# Patient Record
Sex: Female | Born: 1980 | Race: Black or African American | Hispanic: No | Marital: Single | Smoking: Current every day smoker
Health system: Southern US, Community
[De-identification: ages and names within clinical notes are randomized; demographics above are authoritative.]

## PROBLEM LIST (undated history)

## (undated) DIAGNOSIS — I82409 Acute embolism and thrombosis of unspecified deep veins of unspecified lower extremity: Secondary | ICD-10-CM

## (undated) HISTORY — PX: KIDNEY SURGERY: SHX687

## (undated) HISTORY — PX: APPENDECTOMY: SHX54

---

## 2021-03-24 ENCOUNTER — Other Ambulatory Visit: Payer: Self-pay

## 2021-03-24 ENCOUNTER — Emergency Department (HOSPITAL_COMMUNITY)
Admission: EM | Admit: 2021-03-24 | Discharge: 2021-03-24 | Disposition: A | Discharge status: Home / Self Care | Payer: Self-pay | Attending: Emergency Medicine | Admitting: Emergency Medicine

## 2021-03-24 ENCOUNTER — Encounter (HOSPITAL_COMMUNITY): Payer: Self-pay

## 2021-03-24 ENCOUNTER — Ambulatory Visit (HOSPITAL_COMMUNITY): Admission: EM | Admit: 2021-03-24 | Discharge: 2021-03-24 | Payer: Self-pay

## 2021-03-24 ENCOUNTER — Emergency Department (HOSPITAL_COMMUNITY): Payer: Self-pay

## 2021-03-24 ENCOUNTER — Emergency Department (HOSPITAL_BASED_OUTPATIENT_CLINIC_OR_DEPARTMENT_OTHER): Payer: Self-pay

## 2021-03-24 ENCOUNTER — Encounter (HOSPITAL_COMMUNITY): Payer: Self-pay | Admitting: Emergency Medicine

## 2021-03-24 DIAGNOSIS — M7989 Other specified soft tissue disorders: Secondary | ICD-10-CM

## 2021-03-24 DIAGNOSIS — I824Z1 Acute embolism and thrombosis of unspecified deep veins of right distal lower extremity: Secondary | ICD-10-CM

## 2021-03-24 DIAGNOSIS — M79604 Pain in right leg: Secondary | ICD-10-CM

## 2021-03-24 DIAGNOSIS — Z7901 Long term (current) use of anticoagulants: Secondary | ICD-10-CM | POA: Insufficient documentation

## 2021-03-24 DIAGNOSIS — F17210 Nicotine dependence, cigarettes, uncomplicated: Secondary | ICD-10-CM | POA: Insufficient documentation

## 2021-03-24 DIAGNOSIS — I82401 Acute embolism and thrombosis of unspecified deep veins of right lower extremity: Secondary | ICD-10-CM | POA: Insufficient documentation

## 2021-03-24 HISTORY — DX: Acute embolism and thrombosis of unspecified deep veins of unspecified lower extremity: I82.409

## 2021-03-24 LAB — COMPREHENSIVE METABOLIC PANEL
ALT: 16 U/L (ref 0–44)
AST: 15 U/L (ref 15–41)
Albumin: 3.5 g/dL (ref 3.5–5.0)
Alkaline Phosphatase: 97 U/L (ref 38–126)
Anion gap: 7 (ref 5–15)
BUN: 7 mg/dL (ref 6–20)
CO2: 22 mmol/L (ref 22–32)
Calcium: 8.7 mg/dL — ABNORMAL LOW (ref 8.9–10.3)
Chloride: 110 mmol/L (ref 98–111)
Creatinine, Ser: 1.1 mg/dL — ABNORMAL HIGH (ref 0.44–1.00)
GFR, Estimated: >60 mL/min (ref >60)
Glucose, Bld: 92 mg/dL (ref 70–99)
Potassium: 3.2 mmol/L — ABNORMAL LOW (ref 3.5–5.1)
Sodium: 139 mmol/L (ref 135–145)
Total Bilirubin: 0.8 mg/dL (ref 0.3–1.2)
Total Protein: 6.1 g/dL — ABNORMAL LOW (ref 6.5–8.1)

## 2021-03-24 LAB — CBC WITH DIFFERENTIAL/PLATELET
Abs Immature Granulocytes: 0.02 10*3/uL (ref 0.00–0.07)
Basophils Absolute: 0 10*3/uL (ref 0.0–0.1)
Basophils Relative: 0 %
Eosinophils Absolute: 0.1 10*3/uL (ref 0.0–0.5)
Eosinophils Relative: 2 %
HCT: 35.4 % — ABNORMAL LOW (ref 36.0–46.0)
Hemoglobin: 11.9 g/dL — ABNORMAL LOW (ref 12.0–15.0)
Immature Granulocytes: 0 %
Lymphocytes Relative: 45 %
Lymphs Abs: 2.5 10*3/uL (ref 0.7–4.0)
MCH: 34.7 pg — ABNORMAL HIGH (ref 26.0–34.0)
MCHC: 33.6 g/dL (ref 30.0–36.0)
MCV: 103.2 fL — ABNORMAL HIGH (ref 80.0–100.0)
Monocytes Absolute: 0.4 10*3/uL (ref 0.1–1.0)
Monocytes Relative: 7 %
Neutro Abs: 2.6 10*3/uL (ref 1.7–7.7)
Neutrophils Relative %: 46 %
Platelets: 198 10*3/uL (ref 150–400)
RBC: 3.43 MIL/uL — ABNORMAL LOW (ref 3.87–5.11)
RDW: 13.9 % (ref 11.5–15.5)
WBC: 5.5 10*3/uL (ref 4.0–10.5)
nRBC: 0 % (ref 0.0–0.2)

## 2021-03-24 LAB — I-STAT BETA HCG BLOOD, ED (MC, WL, AP ONLY): I-stat hCG, quantitative: <5 m[IU]/mL (ref <5)

## 2021-03-24 IMAGING — CR DG ANKLE COMPLETE 3+V*R*
3 series · 3 of 3 positions shown · non-contrast
Comparison: None.

CLINICAL DATA: Ankle pain following history of prior DVT, initial
encounter

EXAM:
RIGHT ANKLE - COMPLETE 3+ VIEW

[ankle ap]
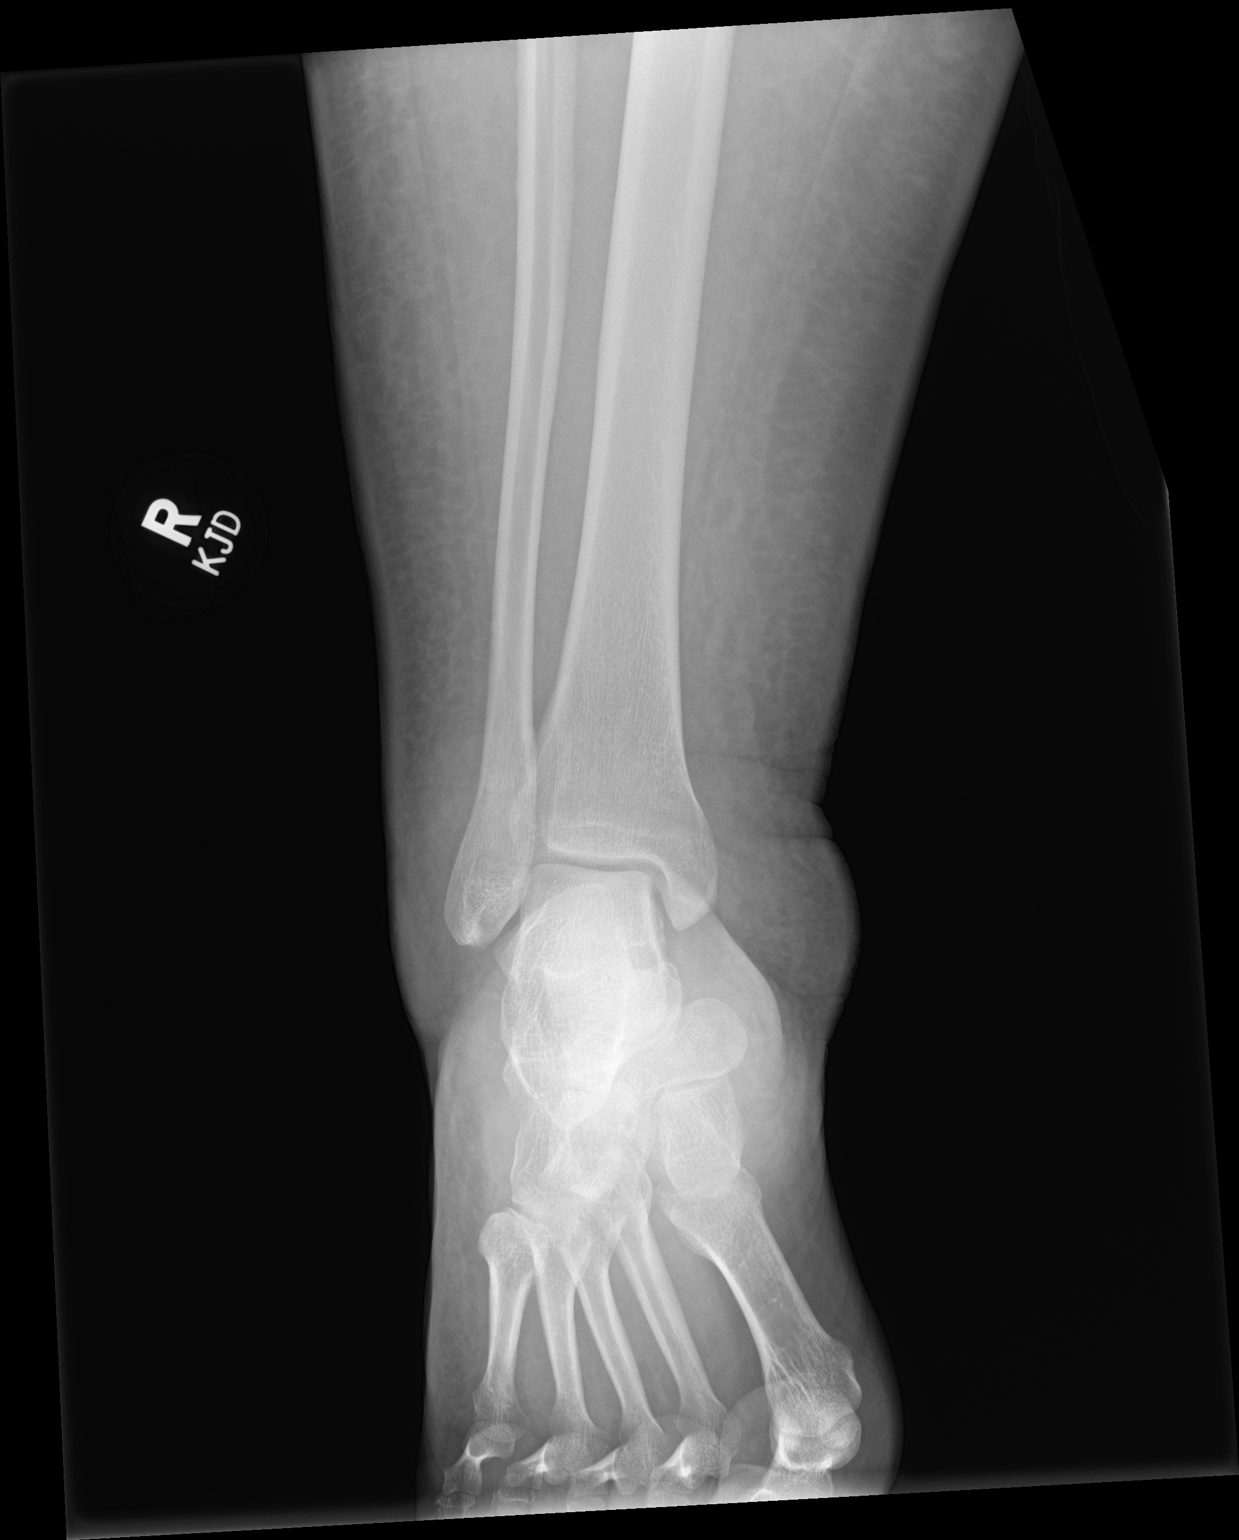

[ankle obl]
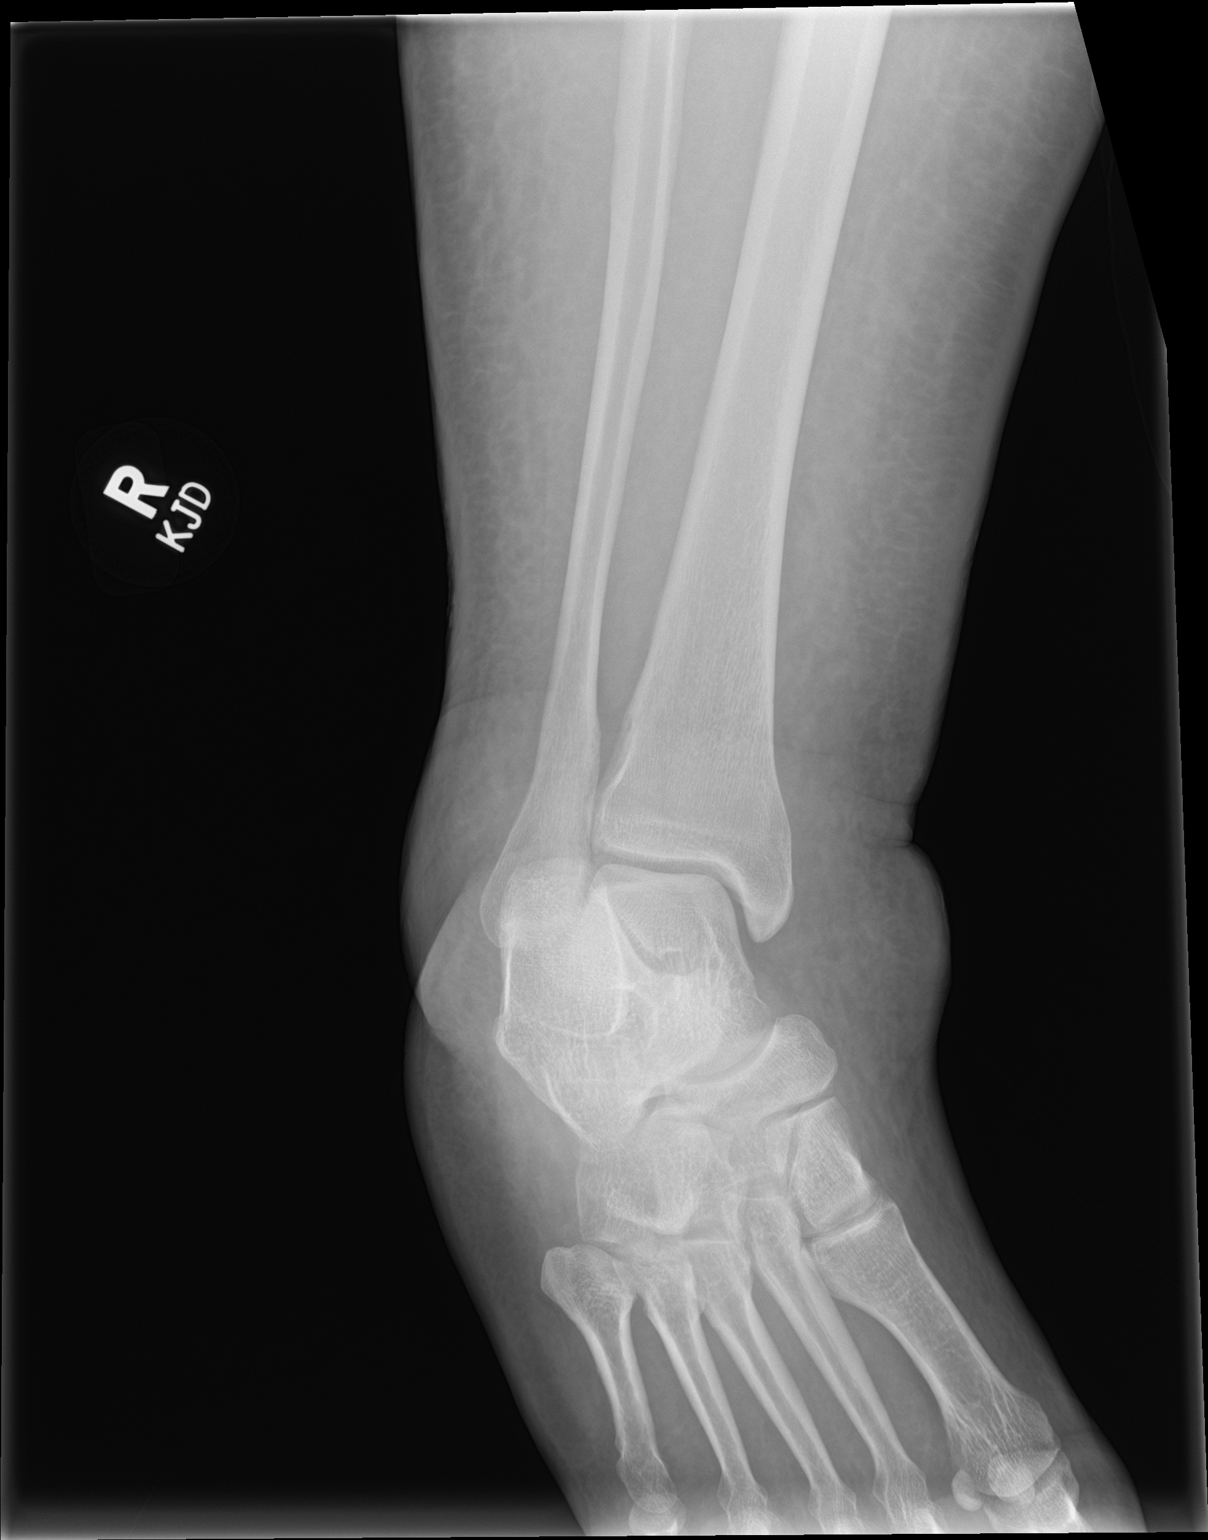

[ankle lat]
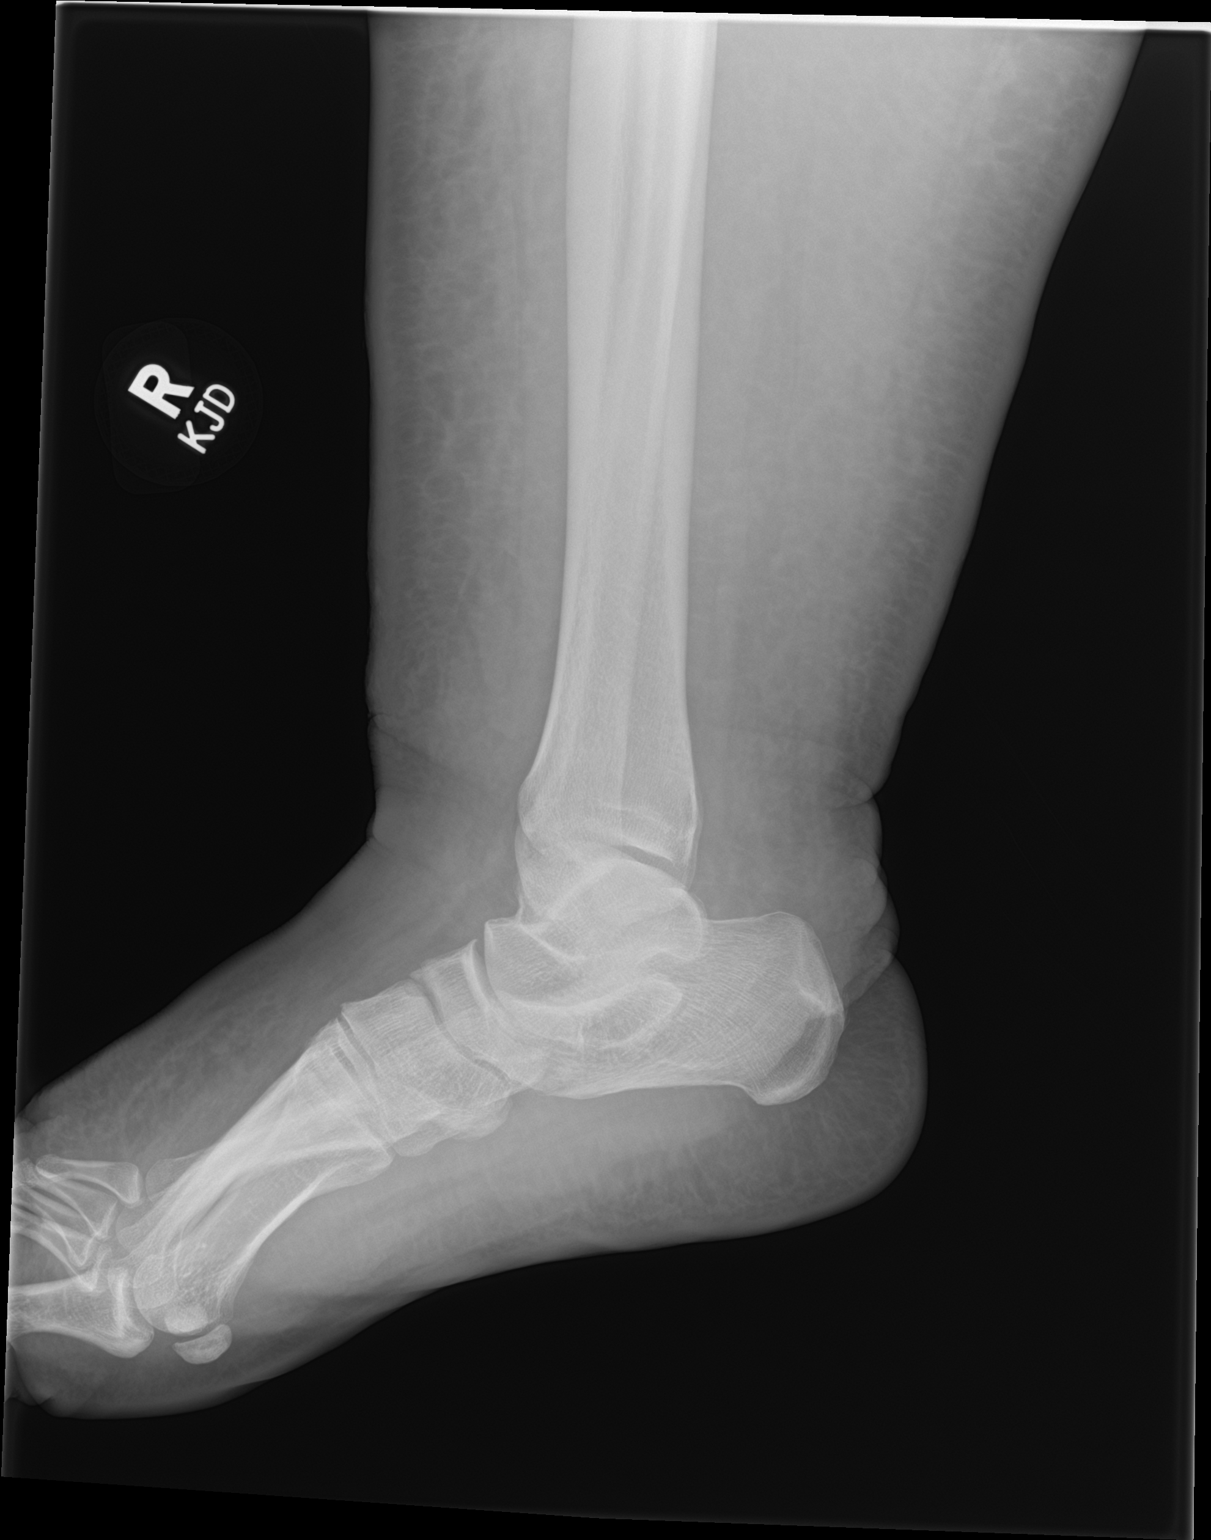

[3 of 3 positions shown; findings below may reference images not displayed]

FINDINGS: Mild generalized soft tissue swelling is noted. No acute fracture or
dislocation is seen. No radiopaque foreign body is noted.
IMPRESSION: No acute bony abnormality is noted. Generalized soft tissue swelling
is seen.

## 2021-03-24 MED ORDER — RIVAROXABAN 20 MG PO TABS: 20.0000 mg | ORAL_TABLET | Freq: Every day | ORAL | 0 refills | Status: DC

## 2021-03-24 MED ORDER — RIVAROXABAN 20 MG PO TABS
20.0000 mg | ORAL_TABLET | Freq: Every day | ORAL | Status: DC
  Administered 2021-03-24: 20 mg via ORAL
  Filled 2021-03-24: qty 1

## 2021-03-24 MED ORDER — POTASSIUM CHLORIDE CRYS ER 20 MEQ PO TBCR
20.0000 meq | EXTENDED_RELEASE_TABLET | Freq: Once | ORAL | Status: AC
  Administered 2021-03-24: 20 meq via ORAL
  Filled 2021-03-24: qty 1

## 2021-03-24 NOTE — ED Triage Notes (Signed)
Pt in with c/o right lower leg swelling x 2 months  Pt states swelling is worse and now her leg is weeping yellow drainage and it is very itchy  Pt was prescribed lovenox for hx of  Dvt but has not taken in over 1 month due to moving

## 2021-03-24 NOTE — ED Provider Notes (Signed)
[facility] EMERGENCY DEPARTMENT Provider Note   CSN: [id number] Arrival date & time: 03/24/21  1635     History No chief complaint on file.   Denise Santana is a 40 y.o. female with PMH/o DVT presents for evaluation of right lower extremity pain, swelling, drainage.  She reports about 2 months ago, she was diagnosed with a DVT and was put on Lovenox.  She reports that she recently moved here from [place] and states that she does not have the Lovenox to take.  She states she has not taken in about 4 to 6 weeks.  She states that she has continued to have swelling noted in the right lower extremity.  She also reports that she had some areas on the skin where it would breakdown and started draining.  She denies any new trauma, injury, fall.  She reports that sometimes it hurts but states that she thinks that this is from walking around on it all the time.  She has not had any fevers, nausea/vomiting, chest pain, difficulty breathing.  The history is provided by the patient.      Past Medical History:  Diagnosis Date   DVT (deep venous thrombosis) (HCC)     There are no problems to display for this patient.   History reviewed. No pertinent surgical history.   OB History   No obstetric history on file.     Family History  Problem Relation Age of Onset   Healthy Mother     Social History   Tobacco Use   Smoking status: Every Day    Pack years: 0.00    Types: Cigarettes   Smokeless tobacco: Never  Substance Use Topics   Alcohol use: Never   Drug use: Never    Home Medications Prior to Admission medications   Medication Sig Start Date End Date Taking? Authorizing Provider  enoxaparin (LOVENOX) 100 MG/ML injection enoxaparin 100 mg/mL subcutaneous syringe  INJECT 1 SYRINGE SUBCUTANEOUSLY EVERY 12 HOURS    [provider]  rivaroxaban (XARELTO) 20 MG TABS tablet Xarelto 20 mg tablet  Take 1 tablet every day by oral route for 30  days.    [provider]    Allergies    Patient has no known allergies.  Review of Systems   Review of Systems  Constitutional:  Negative for fever.  Respiratory:  Negative for shortness of breath.   Cardiovascular:  Positive for leg swelling. Negative for chest pain.  Gastrointestinal:  Negative for abdominal pain, nausea and vomiting.  Neurological:  Negative for weakness, numbness and headaches.  All other systems reviewed and are negative.  Physical Exam Updated Vital Signs BP (!) 100/50 (BP Location: Left Arm)   Pulse 65   Temp 97.9 F (36.6 C) (Oral)   Resp 16   LMP 03/16/2021 (Exact Date)   SpO2 98%   Physical Exam Vitals and nursing note reviewed.  Constitutional:      Appearance: Normal appearance. She is well-developed.  HENT:     Head: Normocephalic and atraumatic.  Eyes:     General: Lids are normal.     Conjunctiva/sclera: Conjunctivae normal.     Pupils: Pupils are equal, round, and reactive to light.  Cardiovascular:     Rate and Rhythm: Normal rate and regular rhythm.     Pulses:          Radial pulses are 2+ on the right side and 2+ on the left side.  Popliteal pulses are 2+ on the left side.       Dorsalis pedis pulses are detected w/ Doppler on the right side and 2+ on the left side.     Heart sounds: Normal heart sounds. No murmur heard.   No friction rub. No gallop.     Comments: Difficulty obtaining a DP pulse in the right lower extremity secondary to swelling.  Easily obtainable Doppler pulse. Pulmonary:     Effort: Pulmonary effort is normal.     Breath sounds: Normal breath sounds.     Comments: Lungs clear to auscultation bilaterally.  Symmetric chest rise.  No wheezing, rales, rhonchi. Abdominal:     Palpations: Abdomen is soft. Abdomen is not rigid.     Tenderness: There is no abdominal tenderness. There is no guarding.  Musculoskeletal:        General: Normal range of motion.     Cervical back: Full passive range of  motion without pain.     Comments: Right lower extremity noted with distal swelling, overlying erythema, scattered, dry scaly skin's.  No open wound.  No active drainage.  No wound in between all 5 digits of right lower extremity.  Skin:    General: Skin is warm and dry.     Capillary Refill: Capillary refill takes less than 2 seconds.     Comments: Good distal cap refill. RLE is not dusky in appearance or cool to touch.  Neurological:     Mental Status: She is alert and oriented to person, place, and time.  Psychiatric:        Speech: Speech normal.          ED Results / Procedures / Treatments   Labs (all labs ordered are listed, but only abnormal results are displayed) Labs Reviewed  CBC WITH DIFFERENTIAL/PLATELET - Abnormal; Notable for the following components:      Result Value   RBC 3.43 (*)    Hemoglobin 11.9 (*)    HCT 35.4 (*)    MCV 103.2 (*)    MCH 34.7 (*)    All other components within normal limits  COMPREHENSIVE METABOLIC PANEL - Abnormal; Notable for the following components:   Potassium 3.2 (*)    Creatinine, Ser 1.10 (*)    Calcium 8.7 (*)    Total Protein 6.1 (*)    All other components within normal limits  I-STAT BETA HCG BLOOD, ED ([facility], [facility], [facility] ONLY)    EKG None  Radiology DG Ankle Complete Right  Result Date: 03/24/2021 CLINICAL DATA:  Ankle pain following history of prior DVT, initial encounter EXAM: RIGHT ANKLE - COMPLETE 3+ VIEW COMPARISON:  None. FINDINGS: Mild generalized soft tissue swelling is noted. No acute fracture or dislocation is seen. No radiopaque foreign body is noted. IMPRESSION: No acute bony abnormality is noted. Generalized soft tissue swelling is seen. Electronically Signed   By: Alcide Clever M.D.   On: 03/24/2021 20:47   DG Foot Complete Right  Result Date: 03/24/2021 CLINICAL DATA:  Foot pain and swelling, no known injury, initial encounter EXAM: RIGHT FOOT COMPLETE - 3+ VIEW COMPARISON:  None. FINDINGS: No acute fracture or  dislocation is noted. Mild tarsal degenerative changes are seen. Generalized soft tissue swelling is noted. IMPRESSION: Soft tissue swelling without acute bony abnormality. Electronically Signed   By: Alcide Clever M.D.   On: 03/24/2021 20:49   VAS [place] LOWER EXTREMITY VENOUS (DVT) (ONLY [facility] & [facility] 7a-7p)  Result Date: 03/24/2021  Lower Venous DVT  Study Patient Name:  Denise Santana  Date of Exam:   03/24/2021 Medical Rec #: [id number]     Accession #:    [id number] Date of Birth: Sep 13, 1981     Patient Gender: F Patient Age:   12Y Exam Location:  [facility] Procedure:      VAS [place] LOWER EXTREMITY VENOUS (DVT) Referring Phys: [id number] Merryn Thaker A Malala Trenkamp --------------------------------------------------------------------------------  Indications: Pain, Swelling, and weeping.  Risk Factors: DVT History of DVT. Patient is prescribed Lovenox but has not been compliant. Comparison Study: No prior study on file Performing Technologist: Sherren Kerns RVS  Examination Guidelines: A complete evaluation includes B-mode imaging, spectral Doppler, color Doppler, and power Doppler as needed of all accessible portions of each vessel. Bilateral testing is considered an integral part of a complete examination. Limited examinations for reoccurring indications may be performed as noted. The reflux portion of the exam is performed with the patient in reverse Trendelenburg.  +---------+---------------+---------+-----------+----------+--------------+ RIGHT    CompressibilityPhasicitySpontaneityPropertiesThrombus Aging +---------+---------------+---------+-----------+----------+--------------+ CFV      Full           Yes      Yes                                 +---------+---------------+---------+-----------+----------+--------------+ SFJ      Full                                                        +---------+---------------+---------+-----------+----------+--------------+ FV Prox  Full                                                         +---------+---------------+---------+-----------+----------+--------------+ FV Mid   Full                                                        +---------+---------------+---------+-----------+----------+--------------+ FV DistalFull                                                        +---------+---------------+---------+-----------+----------+--------------+ PFV      Full                                                        +---------+---------------+---------+-----------+----------+--------------+ POP      Full           Yes      Yes                                 +---------+---------------+---------+-----------+----------+--------------+ PTV      None  Acute          +---------+---------------+---------+-----------+----------+--------------+ PERO     None                                         Acute          +---------+---------------+---------+-----------+----------+--------------+   +----+---------------+---------+-----------+----------+--------------+ LEFTCompressibilityPhasicitySpontaneityPropertiesThrombus Aging +----+---------------+---------+-----------+----------+--------------+ CFV Full           Yes      Yes                                 +----+---------------+---------+-----------+----------+--------------+     Summary: RIGHT: - Findings consistent with acute deep vein thrombosis involving the right peroneal veins, and right posterior tibial veins. - No cystic structure found in the popliteal fossa.  LEFT: - No evidence of common femoral vein obstruction.  *See table(s) above for measurements and observations.    Preliminary     Procedures Procedures   Medications Ordered in ED Medications - No data to display  ED Course  I have reviewed the triage vital signs and the nursing notes.  Pertinent labs & imaging results that were available during my care of  the patient were reviewed by me and considered in my medical decision making (see chart for details).    MDM Rules/Calculators/A&P                          40 year old female who presents for evaluation of right lower extremity swelling.  She also reports it has been draining.  She has a history of known DVTs and was diagnosed about 2 months ago.  She supposed be on Lovenox but states she has not taken it in several weeks.  She denies any fevers, chest pain, difficulty breathing.  On initial arrival, she is afebrile, toxic appearing.  Vital signs are stable.  On exam, right lower extremity is diffusely edematous, erythematous.  She has some areas of dry scaly skin but no open wounds.  No active drainage.  No wounds between her toes.  Will check labs, x-ray, ultrasound.  History/physical exam not concerning for ischemic limb, septic arthritis.  Consider overlying cellulitis as well as most likely continue DVT given lack of anticoagulation therapy.  Vascular US shows acute right DVT of peroneal and tibia veins.   I-STAT beta negative.  CMP shows normal BUN and creatinine.  CBC shows no leukocytosis.  Hemoglobin stable 11.9.  Ankle x-ray negative for any acute bony abnormality.  There is generalized soft tissue swelling noted.  Foot x-ray negative for any acute bony abnormality.  Given the DVT noted on her ultrasound today, we will plan to treat.  At this time, patient is not having any chest pain, difficulty breathing.  She is not tachycardic or hypoxic on exam.   I discussed results with patient.  At this time, I suspect her swelling is likely related DVT as well as some perhaps chronic venous stasis dermatitis.  She has no fever, no leukocytosis.  Doubt that this is reflective of cellulitis.  We will plan to treat.  I discussed with pharmacy.  They recommend starting her on Xarelto 20 mg daily.  I did discuss this with patient she is agreeable to trying the pills.  Our transition of care team has  arranged for her to get the  medications at the pharmacy tomorrow.  We will give her first dose today. At this time, patient exhibits no emergent life-threatening condition that require further evaluation in ED. Patient had ample opportunity for questions and discussion. All patient's questions were answered with full understanding. Strict return precautions discussed. Patient expresses understanding and agreement to plan.   Portions of this note were generated with Scientist, clinical (histocompatibility and immunogenetics)Dragon dictation software. Dictation errors may occur despite best attempts at proofreading.   Final Clinical Impression(s) / ED Diagnoses Final diagnoses:  Acute deep vein thrombosis (DVT) of distal vein of right lower extremity [place](HCC)    Rx / DC Orders ED Discharge Orders     None        Rosana HoesLayden, Levi Crass A, PA-C 03/24/21 2223    Benjiman CorePickering, Nathan, MD 03/24/21 [phone]

## 2021-03-24 NOTE — ED Triage Notes (Signed)
Pt reports R lower leg swelling since having DVT.  Reports weeping yellow drainage and itching x 2 months.    Sent from [facility].

## 2021-03-24 NOTE — Discharge Instructions (Addendum)
It is important that you take the Xarelto.  Please follow-up with referred primary care at [facility].  Call their office on Monday to arrange an appointment.  Return to emergency department for any difficulty breathing, chest pain or any other worsening concerning symptoms.

## 2021-03-24 NOTE — ED Notes (Signed)
Patient is being discharged from the Urgent Care and sent to the Emergency Department via personal vehicle . Per provider Chales Salmon, patient is in need of higher level of care due to DVT rule out. Patient is aware and verbalizes understanding of plan of care.   Vitals:   03/24/21 1615  BP: 114/71  Pulse: 82  Resp: 20  Temp: 98.3 F (36.8 C)  SpO2: 97%

## 2021-03-24 NOTE — Progress Notes (Signed)
VASCULAR LAB    Right lower extremity venous duplex has been performed.  See CV proc for preliminary results.   Messaged results to Graciella Freer, PA-C via secure chat  Sherren Kerns, RVT 03/24/2021, 6:42 PM

## 2021-03-25 NOTE — Care Management (Signed)
Patient consult for medication assistance. Pateint is not eligible for MATCH due to having insurance, pharmacy should have xaralto cards to use to get discounts.

## 2021-04-23 ENCOUNTER — Emergency Department (HOSPITAL_COMMUNITY): Payer: No Typology Code available for payment source

## 2021-04-23 ENCOUNTER — Emergency Department (HOSPITAL_BASED_OUTPATIENT_CLINIC_OR_DEPARTMENT_OTHER): Payer: No Typology Code available for payment source

## 2021-04-23 ENCOUNTER — Encounter (HOSPITAL_COMMUNITY): Payer: Self-pay

## 2021-04-23 ENCOUNTER — Other Ambulatory Visit: Payer: Self-pay

## 2021-04-23 ENCOUNTER — Emergency Department (HOSPITAL_COMMUNITY)
Admission: EM | Admit: 2021-04-23 | Discharge: 2021-04-23 | Disposition: A | Discharge status: Home / Self Care | Payer: No Typology Code available for payment source | Attending: Student | Admitting: Student

## 2021-04-23 DIAGNOSIS — R0602 Shortness of breath: Secondary | ICD-10-CM | POA: Insufficient documentation

## 2021-04-23 DIAGNOSIS — Z7901 Long term (current) use of anticoagulants: Secondary | ICD-10-CM | POA: Insufficient documentation

## 2021-04-23 DIAGNOSIS — Z5321 Procedure and treatment not carried out due to patient leaving prior to being seen by health care provider: Secondary | ICD-10-CM | POA: Diagnosis not present

## 2021-04-23 DIAGNOSIS — Z86711 Personal history of pulmonary embolism: Secondary | ICD-10-CM | POA: Diagnosis not present

## 2021-04-23 DIAGNOSIS — M7989 Other specified soft tissue disorders: Secondary | ICD-10-CM

## 2021-04-23 DIAGNOSIS — M79604 Pain in right leg: Secondary | ICD-10-CM

## 2021-04-23 DIAGNOSIS — R2241 Localized swelling, mass and lump, right lower limb: Secondary | ICD-10-CM | POA: Diagnosis not present

## 2021-04-23 LAB — CBC WITH DIFFERENTIAL/PLATELET
Abs Immature Granulocytes: 0.03 10*3/uL (ref 0.00–0.07)
Basophils Absolute: 0 10*3/uL (ref 0.0–0.1)
Basophils Relative: 0 %
Eosinophils Absolute: 0.1 10*3/uL (ref 0.0–0.5)
Eosinophils Relative: 2 %
HCT: 35.3 % — ABNORMAL LOW (ref 36.0–46.0)
Hemoglobin: 11.5 g/dL — ABNORMAL LOW (ref 12.0–15.0)
Immature Granulocytes: 0 %
Lymphocytes Relative: 51 %
Lymphs Abs: 3.6 10*3/uL (ref 0.7–4.0)
MCH: 33.4 pg (ref 26.0–34.0)
MCHC: 32.6 g/dL (ref 30.0–36.0)
MCV: 102.6 fL — ABNORMAL HIGH (ref 80.0–100.0)
Monocytes Absolute: 0.4 10*3/uL (ref 0.1–1.0)
Monocytes Relative: 6 %
Neutro Abs: 2.8 10*3/uL (ref 1.7–7.7)
Neutrophils Relative %: 41 %
Platelets: 265 10*3/uL (ref 150–400)
RBC: 3.44 MIL/uL — ABNORMAL LOW (ref 3.87–5.11)
RDW: 13.8 % (ref 11.5–15.5)
WBC: 7 10*3/uL (ref 4.0–10.5)
nRBC: 0 % (ref 0.0–0.2)

## 2021-04-23 LAB — BASIC METABOLIC PANEL
Anion gap: 7 (ref 5–15)
BUN: 11 mg/dL (ref 6–20)
CO2: 23 mmol/L (ref 22–32)
Calcium: 9.2 mg/dL (ref 8.9–10.3)
Chloride: 108 mmol/L (ref 98–111)
Creatinine, Ser: 1.18 mg/dL — ABNORMAL HIGH (ref 0.44–1.00)
GFR, Estimated: >60 mL/min (ref >60)
Glucose, Bld: 95 mg/dL (ref 70–99)
Potassium: 3.6 mmol/L (ref 3.5–5.1)
Sodium: 138 mmol/L (ref 135–145)

## 2021-04-23 LAB — PROTIME-INR
INR: 1 (ref 0.8–1.2)
Prothrombin Time: 13.4 seconds (ref 11.4–15.2)

## 2021-04-23 LAB — I-STAT BETA HCG BLOOD, ED (MC, WL, AP ONLY): I-stat hCG, quantitative: <5 m[IU]/mL (ref <5)

## 2021-04-23 LAB — TROPONIN I (HIGH SENSITIVITY): Troponin I (High Sensitivity): 4 ng/L (ref <18)

## 2021-04-23 NOTE — Progress Notes (Signed)
Lower extremity venous has been completed.   Preliminary results in CV Proc.   Blanch Media 04/23/2021 5:18 PM

## 2021-04-23 NOTE — ED Triage Notes (Signed)
Patient complains of 3 months of right lower leg swelling and not taking blood thinner as prescribed. Has hx of DVT. Patient alert and orinted, ANAD

## 2021-04-23 NOTE — ED Notes (Signed)
Pt did not want to stay and left AMA. °

## 2021-04-23 NOTE — ED Provider Notes (Signed)
Emergency Medicine Provider Triage Evaluation Note  Denise Santana , a 40 y.o. female  was evaluated in triage.  Pt complains of progressively worsening right lower extremity swelling x3 months, history of DVT not taking anticoagulation as prescribed.  Supposed to be on Xarelto.  Also endorses worsening shortness of breath with history of PEs.  States that she was initially on injectable, likely Lovenox, Arixtra never started oral anticoagulation due to cost..  Review of Systems  Positive: Shortness of breath, lower extremity swelling,R>L Negative: Chest pain nausea, vomiting, syncope  Physical Exam  BP 132/82   Pulse 66   Temp 98.5 F (36.9 C) (Oral)   Resp 18   SpO2 98%  Gen:   Awake, no distress   Resp:  Normal effort  MSK:   Moves extremities without difficulty  Other:  Significant right lower extremity edema, skin changes, weeping and exquisite tenderness palpation of the posterior calf, borderline tachycardic with regular rhythm, lung CTA B.  Medical Decision Making  Medically screening exam initiated at 4:50 PM.  Appropriate orders placed.  Denise Santana was informed that the remainder of the evaluation will be completed by another provider, this initial triage assessment does not replace that evaluation, and the importance of remaining in the ED until their evaluation is complete.  This chart was dictated using voice recognition software, Dragon. Despite the best efforts of this provider to proofread and correct errors, errors may still occur which can change documentation meaning.    Denise Santana 04/23/21 1655    Glendora Score, MD 04/23/21 [phone]

## 2021-06-13 ENCOUNTER — Encounter: Payer: Self-pay | Attending: Internal Medicine | Admitting: Internal Medicine

## 2021-06-13 ENCOUNTER — Other Ambulatory Visit: Payer: Self-pay

## 2021-06-13 DIAGNOSIS — Z86718 Personal history of other venous thrombosis and embolism: Secondary | ICD-10-CM | POA: Insufficient documentation

## 2021-06-13 DIAGNOSIS — Z87891 Personal history of nicotine dependence: Secondary | ICD-10-CM | POA: Insufficient documentation

## 2021-06-13 DIAGNOSIS — H9191 Unspecified hearing loss, right ear: Secondary | ICD-10-CM | POA: Insufficient documentation

## 2021-06-13 DIAGNOSIS — M7989 Other specified soft tissue disorders: Secondary | ICD-10-CM

## 2021-06-13 DIAGNOSIS — Z7901 Long term (current) use of anticoagulants: Secondary | ICD-10-CM | POA: Insufficient documentation

## 2021-06-13 DIAGNOSIS — I890 Lymphedema, not elsewhere classified: Secondary | ICD-10-CM | POA: Insufficient documentation

## 2021-06-13 DIAGNOSIS — S81801A Unspecified open wound, right lower leg, initial encounter: Secondary | ICD-10-CM | POA: Insufficient documentation

## 2021-06-13 DIAGNOSIS — X58XXXA Exposure to other specified factors, initial encounter: Secondary | ICD-10-CM | POA: Insufficient documentation

## 2021-06-13 NOTE — Progress Notes (Signed)
JERONICA, STLOUIS ([id number]) Visit Report for 06/13/2021 Allergy List Details Patient Name: Denise Santana, Denise Santana Date of Service: 06/13/2021 10:15 AM Medical Record Number: [id number] Patient Account Number: [id number] Date of Birth/Sex: 09-Mar-1981 (40 y.o. F) Treating RN: Denise Santana Primary Care Denise Santana: Denise Santana Other Clinician: Referring Denise Santana: Denise Santana Treating Denise Santana/Extender: Denise Santana in Treatment: 0 Allergies Active Allergies No Known Allergies Allergy Notes Electronic Signature(s) Signed: 06/13/2021 3:51:14 PM By: Denise Santana Entered By: Denise Santana on 06/13/2021 10:28:12 Peeks, Denise Santana ([id number]) -------------------------------------------------------------------------------- Arrival Information Details Patient Name: Denise Santana Date of Service: 06/13/2021 10:15 AM Medical Record Number: [id number] Patient Account Number: [id number] Date of Birth/Sex: Sep 17, 1980 (40 y.o. F) Treating RN: Denise Santana Primary Care Denise Santana: Denise Santana Other Clinician: Referring Denise Santana: Denise Santana Treating Denise Santana/Extender: Denise Santana in Treatment: 0 Visit Information Patient Arrived: Ambulatory Arrival Time: 10:21 Accompanied By: self Transfer Assistance: None Patient Identification Verified: Yes Secondary Verification Process Completed: Yes Patient Has Alerts: Yes Patient Alerts: Patient on Blood Thinner Eliquis Electronic Signature(s) Signed: 06/13/2021 3:51:14 PM By: Denise Santana Entered By: Denise Santana on 06/13/2021 10:25:14 Barresi, Denise Santana ([id number]) -------------------------------------------------------------------------------- Clinic Level of Care Assessment Details Patient Name: Denise Santana Date of Service: 06/13/2021 10:15 AM Medical Record Number: [id number] Patient Account Number: [id number] Date of Birth/Sex: 1981/07/30 (40 y.o. F) Treating RN: Denise Santana Primary Care Calysta Craigo: Denise Santana Other  Clinician: Referring Denise Santana: Denise Santana Treating Denise Santana/Extender: Denise Santana in Treatment: 0 Clinic Level of Care Assessment Items TOOL 2 Quantity Score []  - Use when only an EandM is performed on the INITIAL visit 0 ASSESSMENTS - Nursing Assessment / Reassessment []  - General Physical Exam (combine w/ comprehensive assessment (listed just below) when performed on new 0 pt. evals) []  - 0 Comprehensive Assessment (HX, ROS, Risk Assessments, Wounds Hx, etc.) ASSESSMENTS - Wound and Skin Assessment / Reassessment X - Simple Wound Assessment / Reassessment - one wound 1 5 []  - 0 Complex Wound Assessment / Reassessment - multiple wounds []  - 0 Dermatologic / Skin Assessment (not related to wound area) ASSESSMENTS - Ostomy and/or Continence Assessment and Care []  - Incontinence Assessment and Management 0 []  - 0 Ostomy Care Assessment and Management (repouching, etc.) PROCESS - Coordination of Care X - Simple Patient / Family Education for ongoing care 1 15 []  - 0 Complex (extensive) Patient / Family Education for ongoing care X- 1 10 Staff obtains , Records, Test Results / Process Orders []  - 0 Staff telephones HHA, Nursing Homes / Clarify orders / etc []  - 0 Routine Transfer to another Facility (non-emergent condition) []  - 0 Routine Hospital Admission (non-emergent condition) X- 1 15 New Admissions / / Ordering NPWT, Apligraf, etc. []  - 0 Emergency Hospital Admission (emergent condition) X- 1 10 Simple Discharge Coordination []  - 0 Complex (extensive) Discharge Coordination PROCESS - Special Needs []  - Pediatric / Minor Patient Management 0 []  - 0 Isolation Patient Management []  - 0 Hearing / Language / Visual special needs []  - 0 Assessment of Community assistance (transportation, D/C planning, etc.) []  - 0 Additional assistance / Altered mentation []  - 0 Support Surface(s) Assessment (bed, cushion, seat,  etc.) INTERVENTIONS - Wound Cleansing / Measurement X - Wound Imaging (photographs - any number of wounds) 1 5 []  - 0 Wound Tracing (instead of photographs) X- 1 5 Simple Wound Measurement - one wound []  - 0 Complex Wound Measurement - multiple wounds Denise Santana, Denise Santana ( ) X- 1 5 Simple Wound Cleansing - one wound []  - 0 Complex Wound  Cleansing - multiple wounds INTERVENTIONS - Wound Dressings X - Small Wound Dressing one or multiple wounds 1 10 []  - 0 Medium Wound Dressing one or multiple wounds []  - 0 Large Wound Dressing one or multiple wounds []  - 0 Application of Medications - injection INTERVENTIONS - Miscellaneous []  - External ear exam 0 []  - 0 Specimen Collection (cultures, biopsies, blood, body fluids, etc.) []  - 0 Specimen(s) / Culture(s) sent or taken to Lab for analysis []  - 0 Patient Transfer (multiple staff / Lift / Similar devices) []  - 0 Simple Staple / Suture removal (25 or less) []  - 0 Complex Staple / Suture removal (26 or more) []  - 0 Hypo / Hyperglycemic Management (close monitor of Blood Glucose) X- 1 15 Ankle / Brachial Index (ABI) - do not check if billed separately Has the patient been seen at the hospital within the last three years: Yes Total Score: 95 Level Of Care: New/Established - Level 3 Electronic Signature(s) Signed: 06/13/2021 3:51:14 PM By: Entered By: on 06/13/2021 11:22:11 Denise Santana, ( ) -------------------------------------------------------------------------------- Encounter Discharge Information Details Patient Name: Denise Santana Date of Service: 06/13/2021 10:15 AM Medical Record Number: Patient Account Number: Date of Birth/Sex: [birth year] (40 y.o. F) Treating RN: Denise Santana Primary Care Dontay Harm: 06/15/2021 Other Clinician: Referring Brenan Modesto: Denise Santana Treating Domini Vandehei/Extender: [id number] in Treatment: 0 Encounter Discharge  Information Items Discharge Condition: Stable Ambulatory Status: Ambulatory Discharge Destination: Home Transportation: Private Auto Accompanied By: self Schedule Follow-up Appointment: Yes Clinical Summary of Care: Electronic Signature(s) Signed: 06/13/2021 3:51:14 PM By: 06/15/2021 Entered By: [id number] on 06/13/2021 11:22:50 Denise Santana, Denise Santana (11/13/1980) -------------------------------------------------------------------------------- Lower Extremity Assessment Details Patient Name: 25 Date of Service: 06/13/2021 10:15 AM Medical Record Number: Denise Santana Patient Account Number: Denise Santana Date of Birth/Sex: 1981-03-11 (40 y.o. F) Treating RN: Denise Santana Primary Care Rosendo Couser: Denise Santana Other Clinician: Referring Phillip Maffei: 06/15/2021 Treating Gaynelle Pastrana/Extender: [id number] in Treatment: 0 Edema Assessment Assessed: [Left: No] [Right: Yes] Edema: [Left: Ye] [Right: s] Calf Left: Right: Point of Measurement: 35 cm From Medial Instep 54 cm Ankle Left: Right: Point of Measurement: 9 cm From Medial Instep 37 cm Knee To Floor Left: Right: From Medial Instep 42 cm Vascular Assessment Pulses: Dorsalis Pedis Palpable: [Right:No] Doppler Audible: [Right:Yes] Blood Pressure: Brachial: [Right:90] Ankle: [Right:Dorsalis Pedis: 90 1.00] Electronic Signature(s) Signed: 06/13/2021 3:51:14 PM By: 06/15/2021 Entered By: [id number] on 06/13/2021 10:45:45 Denise Santana, Denise Santana (11/13/1980) -------------------------------------------------------------------------------- Multi Wound Chart Details Patient Name: Denise Santana Date of Service: 06/13/2021 10:15 AM Medical Record Number: Denise Santana Patient Account Number: Denise Santana Date of Birth/Sex: 10/15/1980 (40 y.o. F) Treating RN: Denise Santana Primary Care Lossie Kalp: Denise Santana Other Clinician: Referring Quanesha Klimaszewski: 06/15/2021 Treating Michi Herrmann/Extender: [id number] in Treatment: 0 Vital  Signs Height(in): 66 Pulse(bpm): 97 Weight(lbs): 273 Blood Pressure(mmHg): 142/79 Body Mass Index(BMI): 44 Temperature(F): 98.6 Respiratory Rate(breaths/min): 16 Photos: [N/A:N/A] Wound Location: Right, Circumferential Lower Leg N/A N/A Wounding Event: Gradually Appeared N/A N/A Primary Etiology: Lymphedema N/A N/A Comorbid History: Middle ear problems, Anemia, N/A N/A Lymphedema, Asthma Date Acquired: 12/29/2020 N/A N/A Weeks of Treatment: 0 N/A N/A Wound Status: Open N/A N/A Measurements L x W x D (cm) 14x40x0.1 N/A N/A Area (cm) : 06/15/2021 N/A N/A Volume (cm) : 43.982 N/A N/A Classification: Partial Thickness N/A N/A Exudate Amount: Medium N/A N/A Exudate Type: Serosanguineous N/A N/A Exudate Color: red, brown N/A N/A Exposed Structures: Fascia: No N/A N/A Fat Layer (Subcutaneous Tissue): No Tendon:  No Muscle: No Joint: No Bone: No Limited to Skin Breakdown Treatment Notes Wound #1 (Lower Leg) Wound Laterality: Right, Circumferential Cleanser Normal Saline Boggio, Leen ([id number]) Discharge Instruction: Wash your hands with soap and water. Remove old dressing, discard into plastic bag and place into trash. Cleanse the wound with Normal Saline prior to applying a clean dressing using gauze sponges, not tissues or cotton balls. Do not scrub or use excessive force. Pat dry using gauze sponges, not tissue or cotton balls. Peri-Wound Care Topical Triamcinolone Acetonide Cream, 0.1%, 15 (g) tube Discharge Instruction: Apply as directed by Zaria Taha. Primary Dressing Silvercel 4 1/4x 4 1/4 (in/in) Discharge Instruction: Apply Silvercel 4 1/4x 4 1/4 (in/in) as instructed Secondary Dressing ABD Pad 5x9 (in/in) Discharge Instruction: Cover with ABD pad Kerlix 4.5 x 4.1 (in/yd) Discharge Instruction: Apply Kerlix 4.5 x 4.1 (in/yd) as instructed Secured With 60M Medipore H Soft Cloth Surgical Tape, 2x2 (in/yd) Coban Cohesive Bandage 4x5 (yds) Stretched Discharge  Instruction: Apply Coban as directed. Compression Wrap Compression Stockings Add-Ons Electronic Signature(s) Signed: 06/13/2021 12:47:41 PM By: Geralyn Corwin DO Entered By: Geralyn Corwin on 06/13/2021 12:38:26 Denise Santana, Denise Santana ([id number]) -------------------------------------------------------------------------------- Multi-Disciplinary Care Plan Details Patient Name: Denise Santana Date of Service: 06/13/2021 10:15 AM Medical Record Number: [id number] Patient Account Number: [id number] Date of Birth/Sex: 1981/05/13 (40 y.o. F) Treating RN: Denise Santana Primary Care Adriel Desrosier: Denise Santana Other Clinician: Referring Mohini Heathcock: Denise Santana Treating Maddelyn Rocca/Extender: Denise Santana in Treatment: 0 Active Inactive Electronic Signature(s) Signed: 06/13/2021 3:51:14 PM By: Denise Santana Entered By: Denise Santana on 06/13/2021 11:06:13 Denise Santana, Denise Santana ([id number]) -------------------------------------------------------------------------------- Pain Assessment Details Patient Name: Denise Santana Date of Service: 06/13/2021 10:15 AM Medical Record Number: [id number] Patient Account Number: [id number] Date of Birth/Sex: November 29, 1980 (40 y.o. F) Treating RN: Denise Santana Primary Care Reema Chick: Denise Santana Other Clinician: Referring Sesar Madewell: Denise Santana Treating Dominque Marlin/Extender: Denise Santana in Treatment: 0 Active Problems Location of Pain Severity and Description of Pain Patient Has Paino Yes Site Locations Pain Location: Pain in Ulcers Rate the pain. Current Pain Level: 10 Pain Management and Medication Current Pain Management: Electronic Signature(s) Signed: 06/13/2021 3:51:14 PM By: Denise Santana Entered By: Denise Santana on 06/13/2021 10:27:15 Denise Santana, Denise Santana ([id number]) -------------------------------------------------------------------------------- Patient/Caregiver Education Details Patient Name: Denise Santana Date of Service: 06/13/2021 10:15  AM Medical Record Number: [id number] Patient Account Number: [id number] Date of Birth/Gender: [birth year] (40 y.o. F) Treating RN: Denise Santana Primary Care Physician: Denise Santana Other Clinician: Referring Physician: Maryelizabeth Santana Treating Physician/Extender: Denise Santana in Treatment: 0 Education Assessment Education Provided To: Patient Education Topics Provided Basic Hygiene: Nutrition: Venous: Wound/Skin Impairment: Electronic Signature(s) Signed: 06/13/2021 3:51:14 PM By: Denise Santana Entered By: Denise Santana on 06/13/2021 11:07:04 Denise Santana, Denise Santana ([id number]) -------------------------------------------------------------------------------- Wound Assessment Details Patient Name: Denise Santana Date of Service: 06/13/2021 10:15 AM Medical Record Number: [id number] Patient Account Number: [id number] Date of Birth/Sex: Feb 25, 1981 (40 y.o. F) Treating RN: Denise Santana Primary Care Zariyah Stephens: Denise Santana Other Clinician: Referring Camauri Fleece: Denise Santana Treating Natahlia Hoggard/Extender: Denise Santana in Treatment: 0 Wound Status Wound Number: 1 Primary Etiology: Lymphedema Wound Location: Right, Circumferential Lower Leg Wound Status: Open Wounding Event: Gradually Appeared Comorbid History: Middle ear problems, Anemia, Lymphedema, Asthma Date Acquired: 12/29/2020 Weeks Of Treatment: 0 Clustered Wound: No Photos Wound Measurements Length: (cm) 14 Width: (cm) 40 Depth: (cm) 0.1 Area: (cm) 439.823 Volume: (cm) 43.982 % Reduction in Area: % Reduction in Volume: Tunneling: No Undermining: No Wound Description Classification: Partial Thickness Exudate Amount: Medium Exudate Type: Serosanguineous Exudate Color: red, brown Foul  Odor After Cleansing: No Slough/Fibrino No Wound Bed Exposed Structure Fascia Exposed: No Fat Layer (Subcutaneous Tissue) Exposed: No Tendon Exposed: No Muscle Exposed: No Joint Exposed: No Bone Exposed: No Limited to  Skin Breakdown Treatment Notes Wound #1 (Lower Leg) Wound Laterality: Right, Circumferential Cleanser Normal Saline Discharge Instruction: Wash your hands with soap and water. Remove old dressing, discard into plastic bag and place into trash. Cleanse the wound with Normal Saline prior to applying a clean dressing using gauze sponges, not tissues or cotton balls. Do not scrub or use excessive force. Pat dry using gauze sponges, not tissue or cotton balls. Denise Santana, Denise Santana ([id number]) Peri-Wound Care Topical Triamcinolone Acetonide Cream, 0.1%, 15 (g) tube Discharge Instruction: Apply as directed by Makylie Rivere. Primary Dressing Silvercel 4 1/4x 4 1/4 (in/in) Discharge Instruction: Apply Silvercel 4 1/4x 4 1/4 (in/in) as instructed Secondary Dressing ABD Pad 5x9 (in/in) Discharge Instruction: Cover with ABD pad Kerlix 4.5 x 4.1 (in/yd) Discharge Instruction: Apply Kerlix 4.5 x 4.1 (in/yd) as instructed Secured With 15M Medipore H Soft Cloth Surgical Tape, 2x2 (in/yd) Coban Cohesive Bandage 4x5 (yds) Stretched Discharge Instruction: Apply Coban as directed. Compression Wrap Compression Stockings Add-Ons Electronic Signature(s) Signed: 06/13/2021 3:51:14 PM By: Denise Santana Entered By: Denise Santana on 06/13/2021 10:40:11 Hinderman, Denise Santana ([id number]) -------------------------------------------------------------------------------- Vitals Details Patient Name: Denise Santana Date of Service: 06/13/2021 10:15 AM Medical Record Number: [id number] Patient Account Number: [id number] Date of Birth/Sex: Nov 08, 1980 (40 y.o. F) Treating RN: Denise Santana Primary Care Tahirih Lair: Denise Santana Other Clinician: Referring Tyerra Loretto: Denise Santana Treating Bowden Boody/Extender: Denise Santana in Treatment: 0 Vital Signs Time Taken: 10:25 Temperature (F): 98.6 Height (in): 66 Pulse (bpm): 97 Source: Stated Respiratory Rate (breaths/min): 16 Weight (lbs): 273 Blood Pressure (mmHg):  142/79 Source: Measured Reference Range: 80 - 120 mg / dl Body Mass Index (BMI): 44.1 Electronic Signature(s) Signed: 06/13/2021 3:51:14 PM By: Denise Santana Entered ByHansel Santana on 06/13/2021 10:27:56

## 2021-06-13 NOTE — Progress Notes (Signed)
ROSCHELLE, CALANDRA ([id number]) Visit Report for 06/13/2021 Chief Complaint Document Details Patient Name: Denise Santana, Denise Santana Date of Service: 06/13/2021 10:15 AM Medical Record Number: [id number] Patient Account Number: [id number] Date of Birth/Sex: Sep 12, 1981 (40 y.o. F) Treating RN: Hansel Feinstein Primary Care Provider: [name] Other Clinician: Referring Provider: [name] Treating Provider/Extender: [name] in Treatment: 0 Information Obtained from: Patient Chief Complaint Right lower extremity wound Electronic Signature(s) Signed: 06/13/2021 12:47:41 PM By: Geralyn Corwin DO Entered By: Geralyn Corwin on 06/13/2021 12:38:40 Hardeman, Allissa ([id number]) -------------------------------------------------------------------------------- HPI Details Patient Name: Denise Santana Date of Service: 06/13/2021 10:15 AM Medical Record Number: [id number] Patient Account Number: [id number] Date of Birth/Sex: [birth year] (40 y.o. F) Treating RN: Hansel Feinstein Primary Care Provider: [name] Other Clinician: Referring Provider: [name] Treating Provider/Extender: [name] in Treatment: 0 History of Present Illness HPI Description: Admission 06/13/2021 Ms. Denise Santana is a 40 year old female with a past medical history of DVT on Xarelto that presents to the clinic for a 50-month history of open wound to her right lower extremity. She states she tries to use compression wraps on her own to help with this. She does not currently use wound care dressings. She was recently diagnosed with an acute DVT on 03/24/2021. She states she has been taking Xarelto and has not missed a dose. She reports chronic swelling to her right lower extremity. She currently denies signs of infection. Electronic Signature(s) Signed: 06/13/2021 12:47:41 PM By: Geralyn Corwin DO Entered By: Geralyn Corwin on 06/13/2021 12:41:28 Grothe, Hiawatha  ([id number]) -------------------------------------------------------------------------------- Physical Exam Details Patient Name: Denise Santana Date of Service: 06/13/2021 10:15 AM Medical Record Number: [id number] Patient Account Number: [id number] Date of Birth/Sex: 18-Jan-1981 (40 y.o. F) Treating RN: Hansel Feinstein Primary Care Provider: [name] Other Clinician: Referring Provider: [name] Treating Provider/Extender: [name] in Treatment: 0 Constitutional . Cardiovascular . Psychiatric . Notes Right lower extremity: Nonpitting edema. She has lymphedema skin changes to her ankle and lower leg. She has weeping to this area. No signs of infection. No calf tenderness. Electronic Signature(s) Signed: 06/13/2021 12:47:41 PM By: Geralyn Corwin DO Entered By: Geralyn Corwin on 06/13/2021 12:42:36 Preziosi, Ryhanna ([id number]) -------------------------------------------------------------------------------- Physician Orders Details Patient Name: Denise Santana Date of Service: 06/13/2021 10:15 AM Medical Record Number: [id number] Patient Account Number: [id number] Date of Birth/Sex: 03-10-1981 (40 y.o. F) Treating RN: Hansel Feinstein Primary Care Provider: [name] Other Clinician: Referring Provider: [name] Treating Provider/Extender: [name] in Treatment: 0 Verbal / Phone Orders: No Diagnosis Coding Follow-up Appointments o Return Appointment in 1 week. - weekly wrap o Nurse Visit as needed - call to schedule if wrap needs redone Bathing/ Shower/ Hygiene o May shower with wound dressing protected with water repellent cover or cast protector. o No tub bath. Edema Control - Lymphedema / Segmental Compressive Device / Other o Patient to wear own compression stockings. Remove compression stockings every night before going to bed and put on every morning when getting up. - left leg o Elevate legs to the level of the  heart and pump ankles as often as possible o Elevate leg(s) parallel to the floor when sitting. o DO YOUR BEST to sleep in the bed at night. DO NOT sleep in your recliner. Long hours of sitting in a recliner leads to swelling of the legs and/or potential wounds on your backside. Additional Orders / Instructions o Follow Nutritious Diet and Increase Protein Intake Wound Treatment Wound #1 - Lower Leg Wound Laterality: Right, Circumferential Cleanser: Normal Saline  1 x Per Day/15 Days Discharge Instructions: Wash your hands with soap and water. Remove old dressing, discard into plastic bag and place into trash. Cleanse the wound with Normal Saline prior to applying a clean dressing using gauze sponges, not tissues or cotton balls. Do not scrub or use excessive force. Pat dry using gauze sponges, not tissue or cotton balls. Topical: Triamcinolone Acetonide Cream, 0.1%, 15 (g) tube 1 x Per Day/15 Days Discharge Instructions: Apply as directed by provider. Primary Dressing: Silvercel 4 1/4x 4 1/4 (in/in) 1 x Per Day/15 Days Discharge Instructions: Apply Silvercel 4 1/4x 4 1/4 (in/in) as instructed Secondary Dressing: ABD Pad 5x9 (in/in) 1 x Per Day/15 Days Discharge Instructions: Cover with ABD pad Secondary Dressing: Kerlix 4.5 x 4.1 (in/yd) 1 x Per Day/15 Days Discharge Instructions: Apply Kerlix 4.5 x 4.1 (in/yd) as instructed Secured With: 49M Medipore H Soft Cloth Surgical Tape, 2x2 (in/yd) 1 x Per Day/15 Days Secured With: Coban Cohesive Bandage 4x5 (yds) Stretched 1 x Per Day/15 Days Discharge Instructions: Apply Coban as directed. Notes Keep appointment with [facility] previously scheduled 06/22/21 Electronic Signature(s) Signed: 06/13/2021 12:47:41 PM By: Geralyn Corwin DO Signed: 06/13/2021 3:51:14 PM By: Hansel Feinstein Entered By: Hansel Feinstein on 06/13/2021 11:11:31 Sherfield, Cumi  ([id number]) -------------------------------------------------------------------------------- Problem List Details Patient Name: Denise Santana Date of Service: 06/13/2021 10:15 AM Medical Record Number: [id number] Patient Account Number: [id number] Date of Birth/Sex: 1980-11-05 (40 y.o. F) Treating RN: Hansel Feinstein Primary Care Provider: [name] Other Clinician: Referring Provider: [name] Treating Provider/Extender: [name] in Treatment: 0 Active Problems ICD-10 Encounter Code Description Active Date MDM Diagnosis S81.801A Unspecified open wound, right lower leg, initial encounter 06/13/2021 No Yes I89.0 Lymphedema, not elsewhere classified 06/13/2021 No Yes Z86.718 Personal history of other venous thrombosis and embolism 06/13/2021 No Yes Z79.01 Long term (current) use of anticoagulants 06/13/2021 No Yes Inactive Problems Resolved Problems Electronic Signature(s) Signed: 06/13/2021 12:47:41 PM By: Geralyn Corwin DO Entered By: Geralyn Corwin on 06/13/2021 12:37:34 Scrivener, Lundynn ([id number]) -------------------------------------------------------------------------------- Progress Note Details Patient Name: Denise Santana Date of Service: 06/13/2021 10:15 AM Medical Record Number: [id number] Patient Account Number: [id number] Date of Birth/Sex: Apr 11, 1981 (40 y.o. F) Treating RN: Hansel Feinstein Primary Care Provider: [name] Other Clinician: Referring Provider: [name] Treating Provider/Extender: [name] in Treatment: 0 Subjective Chief Complaint Information obtained from Patient Right lower extremity wound History of Present Illness (HPI) Admission 06/13/2021 Ms. Jennye Runquist is a 40 year old female with a past medical history of DVT on Xarelto that presents to the clinic for a 80-month history of open wound to her right lower extremity. She states she tries to use compression wraps on her own to help with this. She does  not currently use wound care dressings. She was recently diagnosed with an acute DVT on 03/24/2021. She states she has been taking Xarelto and has not missed a dose. She reports chronic swelling to her right lower extremity. She currently denies signs of infection. Patient History Information obtained from Patient. Allergies No Known Allergies Social History Current every day smoker - started on 09/17/1991, Marital Status - Single, Alcohol Use - Moderate, Drug Use - No History, Caffeine Use - Never. 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 Ear/Nose/Mouth/Throat Denies complaints or  symptoms of Difficult clearing ears, Sinusitis. Respiratory Complains or has symptoms of Shortness of Breath - exertion. Cardiovascular Complains or has symptoms of LE edema. Gastrointestinal Denies complaints or symptoms of Frequent diarrhea, Nausea, Vomiting. Endocrine Denies complaints or symptoms of Hepatitis, Thyroid disease, Polydypsia (Excessive Thirst). Genitourinary Denies complaints or symptoms of Kidney failure/ Dialysis, Incontinence/dribbling, state had a stent in her kidney Immunological Denies complaints or symptoms of Hives, Itching. Integumentary (Skin) Complains or has symptoms of Swelling. Musculoskeletal Denies complaints or symptoms of Muscle Pain, Muscle Weakness. Neurologic Denies complaints or symptoms of Numbness/parasthesias, Focal/Weakness. Psychiatric Denies complaints or symptoms of Anxiety, Claustrophobia. Starke, Anupama ([id number]) Objective Constitutional Vitals Time Taken: 10:25 AM, Height: 66 in, Source: Stated, Weight: 273 lbs, Source: Measured, BMI: 44.1, Temperature: 98.6 F, Pulse:  97 bpm, Respiratory Rate: 16 breaths/min, Blood Pressure: 142/79 mmHg. General Notes: Right lower extremity: Nonpitting edema. She has lymphedema skin changes to her ankle and lower leg. She has weeping to this area. No signs of infection. No calf tenderness. Integumentary (Hair, Skin) Wound #1 status is Open. Original cause of wound was Gradually Appeared. The date acquired was: 12/29/2020. The wound is located on the Right,Circumferential Lower Leg. The wound measures 14cm length x 40cm width x 0.1cm depth; 439.823cm^2 area and 43.982cm^3 volume. The wound is limited to skin breakdown. There is no tunneling or undermining noted. There is a medium amount of serosanguineous drainage noted. Assessment Active Problems ICD-10 Unspecified open wound, right lower leg, initial encounter Lymphedema, not elsewhere classified Personal history of other venous thrombosis and embolism Long term (current) use of anticoagulants Patient presents with ongoing skin breakdown and weeping to her right lower extremity for the past 5 months. She has lymphedema. Her ABIs in office were 1. She has a vein and vascular appointment on 10/7 and will be obtaining venous reflux studies at that time. Her history of DVT has worsened her symptoms.. I recommended compression. We will start with Kerlix/Coban and go up to 3 layer next visit as long as she tolerates this. She will benefit from a Velcro wrap in the future and we will address this at next clinic visit. She may need lymphedema pumps as well. Plan Follow-up Appointments: Return Appointment in 1 week. - weekly wrap Nurse Visit as needed - call to schedule if wrap needs redone Bathing/ Shower/ Hygiene: May shower with wound dressing protected with water repellent cover or cast protector. No tub bath. Edema Control - Lymphedema / Segmental Compressive Device / Other: Patient to wear own compression stockings. Remove compression stockings every night before going  to bed and put on every morning when getting up. - left leg Elevate legs to the level of the heart and pump ankles as often as possible Elevate leg(s) parallel to the floor when sitting. DO YOUR BEST to sleep in the bed at night. DO NOT sleep in your recliner. Long hours of sitting in a recliner leads to swelling of the legs and/or potential wounds on your backside. Additional Orders / Instructions: Follow Nutritious Diet and Increase Protein Intake General Notes: Keep appointment with [facility] previously scheduled 06/22/21 WOUND #1: - Lower Leg Wound Laterality: Right, Circumferential Cleanser: Normal Saline 1 x Per Day/15 Days Discharge Instructions: Wash your hands with soap and water. Remove old dressing, discard into plastic bag and place into trash. Cleanse the wound with Normal Saline prior to applying a clean dressing using gauze sponges, not tissues or cotton balls. Do not scrub or use excessive force. Pat dry  using gauze sponges, not tissue or cotton balls. Topical: Triamcinolone Acetonide Cream, 0.1%, 15 (g) tube 1 x Per Day/15 Days Discharge Instructions: Apply as directed by provider. Primary Dressing: Silvercel 4 1/4x 4 1/4 (in/in) 1 x Per Day/15 Days Discharge Instructions: Apply Silvercel 4 1/4x 4 1/4 (in/in) as instructed Secondary Dressing: ABD Pad 5x9 (in/in) 1 x Per Day/15 Days Discharge Instructions: Cover with ABD pad Secondary Dressing: Kerlix 4.5 x 4.1 (in/yd) 1 x Per Day/15 Days Discharge Instructions: Apply Kerlix 4.5 x 4.1 (in/yd) as instructed Akamine, Kinsleigh ([id number]) Secured With: 70M Medipore H Soft Cloth Surgical Tape, 2x2 (in/yd) 1 x Per Day/15 Days Secured With: Coban Cohesive Bandage 4x5 (yds) Stretched 1 x Per Day/15 Days Discharge Instructions: Apply Coban as directed. 1. Silver alginate under Kerlix/Coban 2. Follow-up in 1 week Electronic Signature(s) Signed: 06/13/2021 12:47:41 PM By: Geralyn Corwin DO Entered By: Geralyn Corwin  on 06/13/2021 12:47:06 Froemming, Katelen ([id number]) -------------------------------------------------------------------------------- ROS/PFSH Details Patient Name: Denise Santana Date of Service: 06/13/2021 10:15 AM Medical Record Number: [id number] Patient Account Number: [id number] Date of Birth/Sex: Jan 10, 1981 (40 y.o. 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 Respiratory Complaints and Symptoms: Positive for: Shortness of Breath - exertion Medical History: Positive for: Asthma Cardiovascular Complaints and Symptoms: Positive for: LE edema Gastrointestinal Complaints and Symptoms: Negative for: Frequent diarrhea; Nausea; Vomiting Endocrine Complaints and Symptoms: Negative for: Hepatitis; Thyroid disease; Polydypsia (Excessive Thirst) Genitourinary Complaints and Symptoms: Negative for: Kidney failure/ Dialysis; Incontinence/dribbling Review of System Notes: state had a stent in her kidney Immunological Complaints and Symptoms: Negative for: Hives; Itching Integumentary (Skin) Complaints and Symptoms: Positive for: Swelling Musculoskeletal Shiroma, Belissa ([id number]) Complaints and Symptoms: Negative for: Muscle Pain; Muscle Weakness Neurologic Complaints and Symptoms: Negative for: Numbness/parasthesias; Focal/Weakness Psychiatric Complaints and Symptoms: Negative for: Anxiety; Claustrophobia Hematologic/Lymphatic Medical History: Positive for: Anemia; Lymphedema Oncologic HBO Extended History  Items Ear/Nose/Mouth/Throat: Middle ear problems Immunizations Pneumococcal Vaccine: Received Pneumococcal Vaccination: No Implantable Devices No devices added Family and Social History Current every day smoker - started on 09/17/1991; Marital Status - Single; Alcohol Use: Moderate; Drug Use: No History; Caffeine Use: Never; Financial Concerns: No; Food, Clothing or Shelter Needs: No; Support System Lacking: No; Transportation Concerns: No Electronic Signature(s) Signed: 06/13/2021 12:47:41 PM By: Geralyn Corwin DO Signed: 06/13/2021 3:51:14 PM By: Hansel Feinstein Entered By: Hansel Feinstein on 06/13/2021 10:31:02 Pellicano, Neddie ([id number]) -------------------------------------------------------------------------------- SuperBill Details Patient Name: Denise Santana Date of Service: 06/13/2021 Medical Record Number: [id number] Patient Account Number: [id number] Date of Birth/Sex: 04/03/1981 (40 y.o. F) Treating RN: Hansel Feinstein Primary Care Provider: [name] Other Clinician: Referring Provider: [name] Treating Provider/Extender: [name] in Treatment: 0 Diagnosis Coding ICD-10 Codes Code Description [phone] Unspecified open wound, right lower leg, initial encounter I89.0 Lymphedema, not elsewhere classified Z86.718 Personal history of other venous thrombosis and embolism Z79.01 Long term (current) use of anticoagulants Facility Procedures CPT4 Code: [id number] Description: 99213 - WOUND CARE VISIT-LEV 3 EST PT Modifier: Quantity: 1 Physician Procedures CPT4 Code: 2876811 Description: WC PHYS LEVEL 3 o NEW PT Modifier: Quantity: 1 CPT4 Code: Description: ICD-10 Diagnosis Description S81.801A Unspecified open wound, right lower leg, initial encounter I89.0 Lymphedema, not elsewhere classified  O24.235 Personal history of other venous thrombosis and embolism Z79.01 Long term (current) use of  anticoagulants Modifier: Quantity: Electronic  Signature(s) Signed: 06/13/2021 12:47:41 PM By: Geralyn Corwin DO Entered By: Geralyn Corwin on 06/13/2021 12:47:25

## 2021-06-13 NOTE — Progress Notes (Signed)
NEVADA, KIRCHNER ([id number]) Visit Report for 06/13/2021 Abuse/Suicide Risk Screen Details Patient Name: Denise Santana, Denise Santana Date of Service: 06/13/2021 10:15 AM Medical Record Number: [id number] Patient Account Number: [id number] Date of Birth/Sex: 1980-09-27 (40 y.o. F) Treating RN: Hansel Feinstein Primary Care Dorean Hiebert: [name] Other Clinician: Referring Elon Eoff: [name] Treating Natilee Gauer/Extender: [name] in Treatment: 0 Abuse/Suicide Risk Screen Items Answer ABUSE RISK SCREEN: Has anyone close to you tried to hurt or harm you recentlyo No Do you feel uncomfortable with anyone in your familyo No Has anyone forced you do things that you didnot want to doo No Electronic Signature(s) Signed: 06/13/2021 3:51:14 PM By: Hansel Feinstein Entered By: Hansel Feinstein on 06/13/2021 10:31:08 Carswell, Barron Alvine ([id number]) -------------------------------------------------------------------------------- Activities of Daily Living Details Patient Name: Denise Santana, Denise Santana Date of Service: 06/13/2021 10:15 AM Medical Record Number: [id number] Patient Account Number: [id number] Date of Birth/Sex: June 18, 1981 (40 y.o. F) Treating RN: Hansel Feinstein Primary Care Adriane Gabbert: [name] Other Clinician: Referring Joeleen Wortley: [name] Treating Shawntae Lowy/Extender: [name] in Treatment: 0 Activities of Daily Living Items Answer Activities of Daily Living (Please select one for each item) Drive Automobile Completely Able Take Medications Completely Able Use Telephone Completely Able Care for Appearance Completely Able Use Toilet Completely Able Bath / Shower Completely Able Dress Self Completely Able Feed Self Completely Able Walk Completely Able Get In / Out Bed Completely Able Housework Completely Able Prepare Meals Completely Able Handle Money Completely Able Shop for Self Completely Able Electronic Signature(s) Signed: 06/13/2021 3:51:14 PM By: Hansel Feinstein Entered By:  Hansel Feinstein on 06/13/2021 10:31:27 Stohr, Barron Alvine ([id number]) -------------------------------------------------------------------------------- Education Screening Details Patient Name: Denise Santana Date of Service: 06/13/2021 10:15 AM Medical Record Number: [id number] Patient Account Number: [id number] Date of Birth/Sex: [birth year] (40 y.o. F) Treating RN: Hansel Feinstein Primary Care Danylle Ouk: [name] Other Clinician: Referring Brittnay Pigman: [name] Treating Saraih Lorton/Extender: [name] in Treatment: 0 Primary Learner Assessed: Patient Learning Preferences/Education Level/Primary Language Learning Preference: Explanation Highest Education Level: Grade School Preferred Language: English Cognitive Barrier Language Barrier: No Translator Needed: No Memory Deficit: No Emotional Barrier: No Cultural/Religious Beliefs Affecting Medical Care: No Physical Barrier Impaired Vision: No Impaired Hearing: Yes Complete Loss, Right ear Decreased Hand dexterity: No Knowledge/Comprehension Knowledge Level: High Comprehension Level: High Ability to understand written instructions: High Ability to understand verbal instructions: High Motivation Anxiety Level: Calm Cooperation: Cooperative Education Importance: Acknowledges Need Interest in Health Problems: Asks Questions Perception: Coherent Willingness to Engage in Self-Management High Activities: Readiness to Engage in Self-Management High Activities: Electronic Signature(s) Signed: 06/13/2021 3:51:14 PM By: Hansel Feinstein Entered ByHansel Feinstein on 06/13/2021 10:32:03 Estabrook, Barron Alvine ([id number]) -------------------------------------------------------------------------------- Fall Risk Assessment Details Patient Name: Denise Santana Date of Service: 06/13/2021 10:15 AM Medical Record Number: [id number] Patient Account Number: [id number] Date of Birth/Sex: [birth year] (40 y.o. F) Treating RN: Hansel Feinstein Primary Care  Jaisen Wiltrout: [name] Other Clinician: Referring Kimbly Eanes: [name] Treating Chiana Wamser/Extender: [name] in Treatment: 0 Fall Risk Assessment Items Have you had 2 or more falls in the last 12 monthso 0 No Have you had any fall that resulted in injury in the last 12 monthso 0 No FALLS RISK SCREEN History of falling - immediate or within 3 months 0 No Secondary diagnosis (Do you have 2 or more medical diagnoseso) 0 No Ambulatory aid None/bed rest/wheelchair/nurse 0 Yes Crutches/cane/walker 0 No Furniture 0 No Intravenous therapy Access/Saline/Heparin Lock 0 No Gait/Transferring Normal/ bed rest/ wheelchair 0 Yes Weak (short steps with or without shuffle, stooped but able to lift  head while walking, may 0 No seek support from furniture) Impaired (short steps with shuffle, may have difficulty arising from chair, head down, impaired 0 No balance) Mental Status Oriented to own ability 0 Yes Electronic Signature(s) Signed: 06/13/2021 3:51:14 PM By: Hansel Feinstein Entered By: Hansel Feinstein on 06/13/2021 10:32:24 Deblasi, Georgeanne ([id number]) -------------------------------------------------------------------------------- Foot Assessment Details Patient Name: Denise Santana Date of Service: 06/13/2021 10:15 AM Medical Record Number: [id number] Patient Account Number: [id number] Date of Birth/Sex: May 05, 1981 (40 y.o. F) Treating RN: Hansel Feinstein Primary Care Karley Pho: [name] Other Clinician: Referring Inocencio Roy: [name] Treating Samanda Buske/Extender: [name] in Treatment: 0 Foot Assessment Items Site Locations + = Sensation present, - = Sensation absent, C = Callus, U = Ulcer R = Redness, W = Warmth, M = Maceration, PU = Pre-ulcerative lesion F = Fissure, S = Swelling, D = Dryness Assessment Right: Left: Other Deformity: No No Prior Foot Ulcer: No No Prior Amputation: No No Charcot Joint: No No Ambulatory Status: Ambulatory Without  Help Gait: Steady Electronic Signature(s) Signed: 06/13/2021 3:51:14 PM By: Hansel Feinstein Entered By: Hansel Feinstein on 06/13/2021 10:34:15 Baldonado, Jernee ([id number]) -------------------------------------------------------------------------------- Nutrition Risk Screening Details Patient Name: Denise Santana, Denise Santana Date of Service: 06/13/2021 10:15 AM Medical Record Number: [id number] Patient Account Number: [id number] Date of Birth/Sex: 05-Feb-1981 (40 y.o. F) Treating RN: Hansel Feinstein Primary Care Arley Salamone: [name] Other Clinician: Referring Madalin Hughart: [name] Treating Deaisa Merida/Extender: [name] in Treatment: 0 Height (in): 66 Weight (lbs): 273 Body Mass Index (BMI): 44.1 Nutrition Risk Screening Items Score Screening NUTRITION RISK SCREEN: I have an illness or condition that made me change the kind and/or amount of food I eat 0 No I eat fewer than two meals per day 3 Yes I eat few fruits and vegetables, or milk products 2 Yes I have three or more drinks of beer, liquor or wine almost every day 0 No I have tooth or mouth problems that make it hard for me to eat 0 No I don't always have enough money to buy the food I need 0 No I eat alone most of the time 1 Yes I take three or more different prescribed or over-the-counter drugs a day 0 No Without wanting to, I have lost or gained 10 pounds in the last six months 0 No I am not always physically able to shop, cook and/or feed myself 0 No Nutrition Protocols Good Risk Protocol Moderate Risk Protocol 0 Provide education on nutrition High Risk Proctocol Risk Level: High Risk Score: 6 Notes states she doesn't get hungry and doesn't feel like eating more than once a day sometimes Electronic Signature(s) Signed: 06/13/2021 3:51:14 PM By: Hansel Feinstein Entered ByHansel Feinstein on 06/13/2021 10:33:09

## 2021-06-20 ENCOUNTER — Ambulatory Visit: Payer: Medicaid Other | Admitting: Internal Medicine

## 2021-06-22 ENCOUNTER — Ambulatory Visit (HOSPITAL_COMMUNITY)
Admission: RE | Admit: 2021-06-22 | Discharge: 2021-06-22 | Disposition: A | Discharge status: Home / Self Care | Payer: No Typology Code available for payment source | Source: Ambulatory Visit | Attending: Vascular Surgery | Admitting: Vascular Surgery

## 2021-06-22 ENCOUNTER — Encounter: Payer: Self-pay | Admitting: Vascular Surgery

## 2021-06-22 ENCOUNTER — Ambulatory Visit (INDEPENDENT_AMBULATORY_CARE_PROVIDER_SITE_OTHER): Payer: Self-pay | Admitting: Physician Assistant

## 2021-06-22 ENCOUNTER — Other Ambulatory Visit: Payer: Self-pay

## 2021-06-22 VITALS — BP 143/90 | HR 69 | Temp 97.9°F | Resp 14 | Ht 66.0 in | Wt 276.0 lb

## 2021-06-22 DIAGNOSIS — M7989 Other specified soft tissue disorders: Secondary | ICD-10-CM | POA: Diagnosis present

## 2021-06-22 DIAGNOSIS — I824Y1 Acute embolism and thrombosis of unspecified deep veins of right proximal lower extremity: Secondary | ICD-10-CM

## 2021-06-22 NOTE — Progress Notes (Signed)
[facility]           [facility] [place]  History and Physical    Denise Santana is a 40 y.o. female who presents with RLE leg swelling.  She states that she was diagnosed with a DVT in 2020.  She states that she does not know why she had the DVT but she was travelling back and forth to [place] that were 2hr drives one way.  She did not have an injury to her leg or surgery.  There is family hx as her sister also has hx of blood clots.  She has never had a hematology work up.  She recently had more leg swelling earlier this year and duplex revealed DVT.  She was on Xarelto but now taking Eliquis daily that was prescribed by her PCP.    She does have skin changes to the RLE around the ankle.  She states that when her leg swells a lot, her leg will weep.  She does not wear compression.  She states that her leg swelling is better in the mornings after she has been in bed.  She states that once she is up, it starts swelling right away.  She states that her leg burns and itches when it swells.   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    The pt is not on a statin for cholesterol management.  The pt is not on a daily aspirin.   Other AC:  eliquis  The pt is not on medication for hypertension.   The pt is not diabetic.   Tobacco hx:  current  She does not know of any family hx of AAA  Past Medical History:  Diagnosis Date   DVT (deep venous thrombosis) (HCC)     Past Surgical History:  Procedure Laterality Date   APPENDECTOMY     KIDNEY SURGERY      Social History   Socioeconomic History   Marital status: Single    Spouse name: Not on file   Number of children: Not on file   Years of education: Not on file   Highest education level: Not on file  Occupational History   Not on file  Tobacco Use   Smoking status: Every Day    Types: Cigarettes   Smokeless tobacco: Never  Substance and Sexual Activity   Alcohol use: Never   Drug  use: Never   Sexual activity: Not on file  Other Topics Concern   Not on file  Social History Narrative   Not on file   Social Determinants of Health   Financial Resource Strain: Not on file  Food Insecurity: Not on file  Transportation Needs: Not on file  Physical Activity: Not on file  Stress: Not on file  Social Connections: Not on file  Intimate Partner Violence: Not on file     Family History  Problem Relation Age of Onset   Healthy Mother     Current Outpatient Medications  Medication Sig Dispense Refill   enoxaparin (LOVENOX) 100 MG/ML injection enoxaparin 100 mg/mL subcutaneous syringe  INJECT 1 SYRINGE SUBCUTANEOUSLY EVERY 12 HOURS     rivaroxaban (XARELTO) 20 MG TABS tablet Take 1 tablet (20 mg total) by mouth daily with supper. 60 tablet 0   No current facility-administered medications for this visit.    No Known Allergies  REVIEW OF SYSTEMS:   [X]  denotes positive finding, [ ]   denotes negative finding Cardiac  Comments:  Chest pain or chest pressure:    Shortness of breath upon exertion: x   Short of breath when lying flat:    Irregular heart rhythm:        Vascular    Pain in calf, thigh, or hip brought on by ambulation: x   Pain in feet at night that wakes you up from your sleep:  x   Blood clot in your veins: x   Leg swelling:  x       Pulmonary    Oxygen at home:    Productive cough:     Wheezing:         Neurologic    Sudden weakness in arms or legs:     Sudden numbness in arms or legs:     Sudden onset of difficulty speaking or slurred speech:    Temporary loss of vision in one eye:     Problems with dizziness:         Gastrointestinal    Blood in stool:     Vomited blood:         Genitourinary    Burning when urinating:     Blood in urine:        Psychiatric    Major depression:         Hematologic    Bleeding problems:    Problems with blood clotting too easily: x       Skin    Rashes or ulcers:        Constitutional     Fever or chills:      PHYSICAL EXAMINATION:  Today's Vitals   06/22/21 1016  BP: (!) 143/90  Pulse: 69  Resp: 14  Temp: 97.9 F (36.6 C)  TempSrc: Temporal  Weight: 276 lb (125.2 kg)  Height: 5\' 6"  (1.676 m)  PainSc: 10-Worst pain ever   Body mass index is 44.55 kg/m.   General:  WDWN in NAD; vital signs documented above Gait: Not observed HENT: WNL, normocephalic Pulmonary: normal non-labored breathing without wheezing Cardiac: regular HR; without carotid bruits Abdomen: soft, NT, no masses; aortic pulse is not palpable Skin: without rashes Vascular Exam/Pulses:  Right Left  Radial 2+ (normal) 2+ (normal)  DP 2+ (normal) 2+ (normal)  PT Unable to palpate Unable to palpate    Extremities:     Neurologic: A&O X 3;  moving all extremities equally Psychiatric:  The pt has Normal affect.   Non-Invasive Vascular Imaging:   Venous duplex on 06/22/2021: Venous Reflux Times  +--------------+---------+------+-----------+------------+--------+  RIGHT         Reflux NoRefluxReflux TimeDiameter cmsComments                          Yes                                   +--------------+---------+------+-----------+------------+--------+  CFV                     yes   >1 second                       +--------------+---------+------+-----------+------------+--------+  FV prox       no                                              +--------------+---------+------+-----------+------------+--------+  FV mid        no                                              +--------------+---------+------+-----------+------------+--------+  FV dist       no                                              +--------------+---------+------+-----------+------------+--------+  Popliteal               yes   >1 second                       +--------------+---------+------+-----------+------------+--------+  GSV at SFJ              yes    >500 ms      0.725              +--------------+---------+------+-----------+------------+--------+  GSV prox thighno                           0.670              +--------------+---------+------+-----------+------------+--------+  GSV mid thigh           yes    >500 ms     0.677              +--------------+---------+------+-----------+------------+--------+  GSV dist thighno                           0.746              +--------------+---------+------+-----------+------------+--------+  GSV at knee   no                           0.732              +--------------+---------+------+-----------+------------+--------+  GSV prox calf                              0.704              +--------------+---------+------+-----------+------------+--------+  GSV mid calf                               0.533              +--------------+---------+------+-----------+------------+--------+  SSV Pop Fossa no                           0.327              +--------------+---------+------+-----------+------------+--------+  SSV prox calf no                           0.531              +--------------+---------+------+-----------+------------+--------+  SSV mid calf                               0.521              +--------------+---------+------+-----------+------------+--------+  Summary:  Right:  - No evidence of superficial venous reflux seen in the right short  saphenous vein.  - Venous reflux is noted in the right common femoral vein.  - Venous reflux is noted in the right sapheno-femoral junction.  - Venous reflux is noted in the right greater saphenous vein in the thigh.  - Venous reflux is noted in the right popliteal vein.   Denise Santana is a 40 y.o. female who presents with: hx of DVT and RLE swelling  -pt has easily palpable DP pedal pulses -pt does not have evidence of DVT on today's study.  Pt does have venous reflux at the [facility] and  GSV in the mid thigh.  The vein is large measuring more than 6-48mm. She also has reflux in the deep system at the CFV and popliteal vein -discussed with pt about wearing thigh high 20-30 mmHg compression stockings  -discussed the importance of leg elevation and how to elevate properly - pt is advised to elevate their legs and a diagram is given to them to demonstrate to lay flat on their back with knees elevated and slightly bent with their feet higher than her knees, which puts their feet higher than their heart for 15 minutes per day.  If they cannot lay flat, advised to lay as flat as possible.  -pt is advised to continue as much walking as possible and avoid sitting or standing for long periods of time.  -discussed importance of weight loss and exercise and that water aerobics would also be beneficial.  -handout with recommendations given -given she has hx of DVT and her sister has hx of DVT, she may benefit from hematology workup in the future.  Continue anticoagulation. -pt will f/u 3 months for evaluation for laser ablation   Doreatha Massed, [facility] 06/22/2021 10:20 AM  Clinic MD:  Karin Lieu

## 2021-06-27 ENCOUNTER — Ambulatory Visit: Payer: Medicaid Other | Admitting: Internal Medicine

## 2021-09-19 ENCOUNTER — Ambulatory Visit (INDEPENDENT_AMBULATORY_CARE_PROVIDER_SITE_OTHER): Payer: No Typology Code available for payment source | Admitting: Vascular Surgery

## 2021-09-19 ENCOUNTER — Other Ambulatory Visit: Payer: Self-pay

## 2021-09-19 ENCOUNTER — Encounter: Payer: Self-pay | Admitting: Vascular Surgery

## 2021-09-19 VITALS — BP 120/84 | HR 75 | Temp 98.0°F | Resp 20 | Ht 66.0 in | Wt 276.0 lb

## 2021-09-19 DIAGNOSIS — I872 Venous insufficiency (chronic) (peripheral): Secondary | ICD-10-CM

## 2021-09-19 NOTE — Progress Notes (Signed)
Patient ID: Denise Santana, female   DOB: 1980-10-16, 41 y.o.   MRN: [id number]  Reason for Consult: Follow-up   Referred by Fanny Bien, MD  Subjective:     HPI:  Denise Santana is a 41 y.o. female with history of right lower extremity DVT and family history of DVT as well currently on Eliquis.  She has noted skin changes around her ankle with significant swelling of the right lower extremity for the past several months.  She has been compliant with compression stockings that were special ordered.  She does work on her feet all day with Alzheimer's patients.  She has not had any ulceration just itching of the skin of the ankle.  She states that her right lower extremity does swell and worsens throughout the day.  Past Medical History:  Diagnosis Date   DVT (deep venous thrombosis) ([place])    Family History  Problem Relation Age of Onset   Hypertension Mother    Hyperlipidemia Mother    Diabetes Mother    Heart failure Mother    Past Surgical History:  Procedure Laterality Date   APPENDECTOMY     KIDNEY SURGERY      Short Social History:  Social History   Tobacco Use   Smoking status: Every Day    Types: Cigarettes   Smokeless tobacco: Never  Substance Use Topics   Alcohol use: Never    No Known Allergies  Current Outpatient Medications  Medication Sig Dispense Refill   rivaroxaban (XARELTO) 20 MG TABS tablet Take 1 tablet (20 mg total) by mouth daily with supper. 60 tablet 0   No current facility-administered medications for this visit.    Review of Systems  Constitutional:  Constitutional negative. HENT: HENT negative.  Eyes: Eyes negative.  Cardiovascular: Positive for leg swelling.  GI: Gastrointestinal negative.  Musculoskeletal: Positive for leg pain.  Skin: Positive for rash.  Neurological: Neurological negative. Hematologic: Hematologic/lymphatic negative.  Psychiatric: Psychiatric negative.       Objective:  Objective  Vitals:   09/19/21  1113  BP: 120/84  Pulse: 75  Resp: 20  Temp: 98 F (36.7 C)  SpO2: 98%   Physical Exam HENT:     Head: Normocephalic.     Nose:     Comments: Wearing a mask Eyes:     Pupils: Pupils are equal, round, and reactive to light.  Cardiovascular:     Rate and Rhythm: Normal rate.  Pulmonary:     Effort: Pulmonary effort is normal.  Abdominal:     General: Abdomen is flat.     Palpations: Abdomen is soft.  Musculoskeletal:     Cervical back: Normal range of motion and neck supple.     Right lower leg: Edema present.     Left lower leg: No edema.  Neurological:     General: No focal deficit present.     Mental Status: She is alert.  Psychiatric:        Mood and Affect: Mood normal.        Behavior: Behavior normal.        Thought Content: Thought content normal.        Judgment: Judgment normal.     Data: Venous Reflux Times  +--------------+---------+------+-----------+------------+--------+   RIGHT          Reflux No Reflux Reflux Time Diameter cms Comments  Yes                                       +--------------+---------+------+-----------+------------+--------+   CFV                       yes    >1 second                          +--------------+---------+------+-----------+------------+--------+   FV prox        no                                                   +--------------+---------+------+-----------+------------+--------+   FV mid         no                                                   +--------------+---------+------+-----------+------------+--------+   FV dist        no                                                   +--------------+---------+------+-----------+------------+--------+   Popliteal                 yes    >1 second                          +--------------+---------+------+-----------+------------+--------+   GSV at SFJ                yes     >500 ms      0.725                 +--------------+---------+------+-----------+------------+--------+   GSV prox thigh no                              0.670                +--------------+---------+------+-----------+------------+--------+   GSV mid thigh             yes     >500 ms      0.677                +--------------+---------+------+-----------+------------+--------+   GSV dist thigh no                              0.746                +--------------+---------+------+-----------+------------+--------+   GSV at knee    no                              0.732                +--------------+---------+------+-----------+------------+--------+   GSV prox calf  0.704                +--------------+---------+------+-----------+------------+--------+   GSV mid calf                                   0.533                +--------------+---------+------+-----------+------------+--------+   SSV Pop Fossa  no                              0.327                +--------------+---------+------+-----------+------------+--------+   SSV prox calf  no                              0.531                +--------------+---------+------+-----------+------------+--------+   SSV mid calf                                   0.521                +--------------+---------+------+-----------+------------+--------+    Summary:  Right:  - No evidence of deep vein thrombosis seen in the right lower extremity,  from the common femoral through the popliteal veins.  - No evidence of superficial venous thrombosis in the right lower  extremity.  - No evidence of superficial venous reflux seen in the right short  saphenous vein.  - Venous reflux is noted in the right common femoral vein.  - Venous reflux is noted in the right sapheno-femoral junction.  - Venous reflux is noted in the right greater saphenous vein in the thigh.  - Venous reflux is noted in the right popliteal vein.  - No obvious DVT or SVT, however  imaging at ankle is somewhat limited by  scleroderma.   I have independently evaluated her right lower extremity with ultrasound in the office today.  She has a very large greater saphenous vein measuring over 0.5 cm from the calf all the way up to the saphenofemoral junction.  This does not enter the fascia just at the level or above the knee.     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  She has been compliant with thigh-high stockings that were special order.  I discussed the risk and benefits as well as alternatives of continued anticoagulation and stockings.  She demonstrates good understanding.  We will get him approval for saphenous vein ablation and plan for this in the near future.     Waynetta Sandy MD [facility]

## 2021-09-24 ENCOUNTER — Inpatient Hospital Stay (HOSPITAL_COMMUNITY)
Admission: EM | Admit: 2021-09-24 | Discharge: 2021-09-27 | DRG: 683 | Disposition: A | Discharge status: Home / Self Care | Payer: Medicare Other | Attending: Internal Medicine | Admitting: Internal Medicine

## 2021-09-24 ENCOUNTER — Emergency Department (HOSPITAL_COMMUNITY): Payer: Medicare Other

## 2021-09-24 DIAGNOSIS — Z86718 Personal history of other venous thrombosis and embolism: Secondary | ICD-10-CM

## 2021-09-24 DIAGNOSIS — E861 Hypovolemia: Secondary | ICD-10-CM | POA: Diagnosis present

## 2021-09-24 DIAGNOSIS — D72829 Elevated white blood cell count, unspecified: Secondary | ICD-10-CM | POA: Diagnosis present

## 2021-09-24 DIAGNOSIS — E876 Hypokalemia: Secondary | ICD-10-CM

## 2021-09-24 DIAGNOSIS — R600 Localized edema: Secondary | ICD-10-CM | POA: Diagnosis present

## 2021-09-24 DIAGNOSIS — Z86711 Personal history of pulmonary embolism: Secondary | ICD-10-CM

## 2021-09-24 DIAGNOSIS — R112 Nausea with vomiting, unspecified: Secondary | ICD-10-CM

## 2021-09-24 DIAGNOSIS — N179 Acute kidney failure, unspecified: Secondary | ICD-10-CM | POA: Diagnosis not present

## 2021-09-24 DIAGNOSIS — R197 Diarrhea, unspecified: Secondary | ICD-10-CM

## 2021-09-24 DIAGNOSIS — R079 Chest pain, unspecified: Secondary | ICD-10-CM

## 2021-09-24 DIAGNOSIS — F17210 Nicotine dependence, cigarettes, uncomplicated: Secondary | ICD-10-CM | POA: Diagnosis present

## 2021-09-24 DIAGNOSIS — K529 Noninfective gastroenteritis and colitis, unspecified: Secondary | ICD-10-CM | POA: Diagnosis present

## 2021-09-24 DIAGNOSIS — Z6841 Body Mass Index (BMI) 40.0 and over, adult: Secondary | ICD-10-CM

## 2021-09-24 DIAGNOSIS — Z20822 Contact with and (suspected) exposure to covid-19: Secondary | ICD-10-CM | POA: Diagnosis present

## 2021-09-24 DIAGNOSIS — D539 Nutritional anemia, unspecified: Secondary | ICD-10-CM | POA: Diagnosis present

## 2021-09-24 DIAGNOSIS — I872 Venous insufficiency (chronic) (peripheral): Secondary | ICD-10-CM | POA: Diagnosis present

## 2021-09-24 DIAGNOSIS — Z7901 Long term (current) use of anticoagulants: Secondary | ICD-10-CM

## 2021-09-24 DIAGNOSIS — E8721 Acute metabolic acidosis: Secondary | ICD-10-CM | POA: Diagnosis present

## 2021-09-24 DIAGNOSIS — E860 Dehydration: Secondary | ICD-10-CM | POA: Diagnosis present

## 2021-09-24 DIAGNOSIS — Z8249 Family history of ischemic heart disease and other diseases of the circulatory system: Secondary | ICD-10-CM

## 2021-09-24 LAB — COMPREHENSIVE METABOLIC PANEL
ALT: 16 U/L (ref 0–44)
AST: 15 U/L (ref 15–41)
Albumin: 3.9 g/dL (ref 3.5–5.0)
Alkaline Phosphatase: 92 U/L (ref 38–126)
Anion gap: 12 (ref 5–15)
BUN: 27 mg/dL — ABNORMAL HIGH (ref 6–20)
CO2: 18 mmol/L — ABNORMAL LOW (ref 22–32)
Calcium: 8.9 mg/dL (ref 8.9–10.3)
Chloride: 108 mmol/L (ref 98–111)
Creatinine, Ser: 2.15 mg/dL — ABNORMAL HIGH (ref 0.44–1.00)
GFR, Estimated: 29 mL/min — ABNORMAL LOW (ref >60)
Glucose, Bld: 97 mg/dL (ref 70–99)
Potassium: 3.5 mmol/L (ref 3.5–5.1)
Sodium: 138 mmol/L (ref 135–145)
Total Bilirubin: 1.6 mg/dL — ABNORMAL HIGH (ref 0.3–1.2)
Total Protein: 8.2 g/dL — ABNORMAL HIGH (ref 6.5–8.1)

## 2021-09-24 LAB — CBC WITH DIFFERENTIAL/PLATELET
Abs Immature Granulocytes: 0.05 10*3/uL (ref 0.00–0.07)
Basophils Absolute: 0 10*3/uL (ref 0.0–0.1)
Basophils Relative: 0 %
Eosinophils Absolute: 0 10*3/uL (ref 0.0–0.5)
Eosinophils Relative: 0 %
HCT: 35.4 % — ABNORMAL LOW (ref 36.0–46.0)
Hemoglobin: 12 g/dL (ref 12.0–15.0)
Immature Granulocytes: 0 %
Lymphocytes Relative: 14 %
Lymphs Abs: 1.9 10*3/uL (ref 0.7–4.0)
MCH: 34.6 pg — ABNORMAL HIGH (ref 26.0–34.0)
MCHC: 33.9 g/dL (ref 30.0–36.0)
MCV: 102 fL — ABNORMAL HIGH (ref 80.0–100.0)
Monocytes Absolute: 0.5 10*3/uL (ref 0.1–1.0)
Monocytes Relative: 3 %
Neutro Abs: 11 10*3/uL — ABNORMAL HIGH (ref 1.7–7.7)
Neutrophils Relative %: 83 %
Platelets: 219 10*3/uL (ref 150–400)
RBC: 3.47 MIL/uL — ABNORMAL LOW (ref 3.87–5.11)
RDW: 14.9 % (ref 11.5–15.5)
WBC: 13.4 10*3/uL — ABNORMAL HIGH (ref 4.0–10.5)
nRBC: 0 % (ref 0.0–0.2)

## 2021-09-24 LAB — RESP PANEL BY RT-PCR (FLU A&B, COVID) ARPGX2
Influenza A by PCR: NEGATIVE
Influenza B by PCR: NEGATIVE
SARS Coronavirus 2 by RT PCR: NEGATIVE

## 2021-09-24 LAB — TROPONIN I (HIGH SENSITIVITY): Troponin I (High Sensitivity): 29 ng/L — ABNORMAL HIGH (ref <18)

## 2021-09-24 LAB — I-STAT BETA HCG BLOOD, ED (MC, WL, AP ONLY): I-stat hCG, quantitative: <5 m[IU]/mL (ref <5)

## 2021-09-24 MED ORDER — ONDANSETRON 4 MG PO TBDP
4.0000 mg | ORAL_TABLET | Freq: Once | ORAL | Status: AC | PRN
  Administered 2021-09-24: 4 mg via ORAL

## 2021-09-24 NOTE — ED Provider Triage Note (Signed)
Emergency Medicine Provider Triage Evaluation Note  Denise Santana , a 41 y.o. female  was evaluated in triage.  Pt complains of shortness of breath.  Shortness of breath has been present since Saturday.  Patient reports that she has had an intermittent cough over this time as well.  States that cough is producing white mucus.  Patient also endorses vomiting and poor p.o. intake.  Patient states that she has had swelling and pain to right lower extremity.  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  Chart review was seen by vascular specialist on 1/4   Review of Systems  Positive: Shortness of breath, productive cough, decreased appetite, vomiting Negative: Fever, chills, chest pain, hemoptysis, melena, blood in stool, vaginal bleeding  Physical Exam  BP 96/68 (BP Location: Left Arm)    Pulse 95    Temp 98.7 F (37.1 C)    Resp 18    SpO2 96%  Gen:   Awake, no distress   Resp:  Normal effort, lungs clear to auscultation bilaterally MSK:   Moves extremities without difficulty, edema to bilateral lower extremities with right greater than left.  Other:    Medical Decision Making  Medically screening exam initiated at 7:40 PM.  Appropriate orders placed.  Denise Santana was informed that the remainder of the evaluation will be completed by another provider, this initial triage assessment does not replace that evaluation, and the importance of remaining in the ED until their evaluation is complete.     Denise Santana, [place] 09/24/21 1942

## 2021-09-24 NOTE — ED Triage Notes (Signed)
Pt w hx DVT, c/o vomiting, poor PO intake x3 days, states she also felt a DVT "move over the weekend- Saturday." Endorses pain in R leg, SHOB, scheduled for sx to remove DVT, unsure when.

## 2021-09-25 ENCOUNTER — Observation Stay (HOSPITAL_COMMUNITY): Payer: Medicare Other

## 2021-09-25 ENCOUNTER — Emergency Department (HOSPITAL_COMMUNITY): Payer: Medicare Other

## 2021-09-25 DIAGNOSIS — R112 Nausea with vomiting, unspecified: Secondary | ICD-10-CM

## 2021-09-25 DIAGNOSIS — E876 Hypokalemia: Secondary | ICD-10-CM

## 2021-09-25 DIAGNOSIS — N179 Acute kidney failure, unspecified: Secondary | ICD-10-CM | POA: Diagnosis present

## 2021-09-25 DIAGNOSIS — R197 Diarrhea, unspecified: Secondary | ICD-10-CM

## 2021-09-25 DIAGNOSIS — Z86718 Personal history of other venous thrombosis and embolism: Secondary | ICD-10-CM

## 2021-09-25 DIAGNOSIS — R079 Chest pain, unspecified: Secondary | ICD-10-CM

## 2021-09-25 LAB — CBC WITH DIFFERENTIAL/PLATELET
Abs Immature Granulocytes: 0.03 10*3/uL (ref 0.00–0.07)
Basophils Absolute: 0 10*3/uL (ref 0.0–0.1)
Basophils Relative: 0 %
Eosinophils Absolute: 0 10*3/uL (ref 0.0–0.5)
Eosinophils Relative: 0 %
HCT: 33.6 % — ABNORMAL LOW (ref 36.0–46.0)
Hemoglobin: 11.7 g/dL — ABNORMAL LOW (ref 12.0–15.0)
Immature Granulocytes: 0 %
Lymphocytes Relative: 19 %
Lymphs Abs: 2.2 10*3/uL (ref 0.7–4.0)
MCH: 35.1 pg — ABNORMAL HIGH (ref 26.0–34.0)
MCHC: 34.8 g/dL (ref 30.0–36.0)
MCV: 100.9 fL — ABNORMAL HIGH (ref 80.0–100.0)
Monocytes Absolute: 0.5 10*3/uL (ref 0.1–1.0)
Monocytes Relative: 4 %
Neutro Abs: 9.3 10*3/uL — ABNORMAL HIGH (ref 1.7–7.7)
Neutrophils Relative %: 77 %
Platelets: 217 10*3/uL (ref 150–400)
RBC: 3.33 MIL/uL — ABNORMAL LOW (ref 3.87–5.11)
RDW: 14.6 % (ref 11.5–15.5)
WBC: 12.1 10*3/uL — ABNORMAL HIGH (ref 4.0–10.5)
nRBC: 0 % (ref 0.0–0.2)

## 2021-09-25 LAB — BASIC METABOLIC PANEL
Anion gap: 15 (ref 5–15)
BUN: 41 mg/dL — ABNORMAL HIGH (ref 6–20)
CO2: 21 mmol/L — ABNORMAL LOW (ref 22–32)
Calcium: 8.9 mg/dL (ref 8.9–10.3)
Chloride: 100 mmol/L (ref 98–111)
Creatinine, Ser: 3.43 mg/dL — ABNORMAL HIGH (ref 0.44–1.00)
GFR, Estimated: 17 mL/min — ABNORMAL LOW (ref >60)
Glucose, Bld: 132 mg/dL — ABNORMAL HIGH (ref 70–99)
Potassium: 3.1 mmol/L — ABNORMAL LOW (ref 3.5–5.1)
Sodium: 136 mmol/L (ref 135–145)

## 2021-09-25 LAB — URINALYSIS, ROUTINE W REFLEX MICROSCOPIC
Bilirubin Urine: NEGATIVE
Glucose, UA: NEGATIVE mg/dL
Ketones, ur: NEGATIVE mg/dL
Leukocytes,Ua: NEGATIVE
Nitrite: NEGATIVE
Protein, ur: 30 mg/dL — AB
Specific Gravity, Urine: 1.025 (ref 1.005–1.030)
pH: 5.5 (ref 5.0–8.0)

## 2021-09-25 LAB — URINALYSIS, MICROSCOPIC (REFLEX)

## 2021-09-25 LAB — TROPONIN I (HIGH SENSITIVITY): Troponin I (High Sensitivity): 19 ng/L — ABNORMAL HIGH (ref <18)

## 2021-09-25 MED ORDER — ONDANSETRON HCL 4 MG/2ML IJ SOLN: 4.0000 mg | Freq: Four times a day (QID) | INTRAMUSCULAR | Status: DC | PRN

## 2021-09-25 MED ORDER — ENOXAPARIN SODIUM 150 MG/ML IJ SOSY
126.0000 mg | PREFILLED_SYRINGE | INTRAMUSCULAR | Status: DC
  Administered 2021-09-25: 126 mg via SUBCUTANEOUS
  Filled 2021-09-25 (×3): qty 0.84

## 2021-09-25 MED ORDER — SODIUM CHLORIDE 0.9 % IV SOLN
1.0000 g | INTRAVENOUS | Status: DC
  Administered 2021-09-25: 1 g via INTRAVENOUS
  Filled 2021-09-25: qty 10

## 2021-09-25 MED ORDER — POTASSIUM CHLORIDE CRYS ER 20 MEQ PO TBCR
40.0000 meq | EXTENDED_RELEASE_TABLET | Freq: Once | ORAL | Status: AC
  Administered 2021-09-25: 40 meq via ORAL
  Filled 2021-09-25: qty 2

## 2021-09-25 MED ORDER — ALBUTEROL SULFATE (2.5 MG/3ML) 0.083% IN NEBU: 2.5000 mg | INHALATION_SOLUTION | RESPIRATORY_TRACT | Status: DC | PRN

## 2021-09-25 MED ORDER — HYDROCODONE-ACETAMINOPHEN 5-325 MG PO TABS
1.0000 | ORAL_TABLET | ORAL | Status: DC | PRN
  Administered 2021-09-25 – 2021-09-27 (×4): 2 via ORAL
  Filled 2021-09-25 (×4): qty 2

## 2021-09-25 MED ORDER — SENNOSIDES-DOCUSATE SODIUM 8.6-50 MG PO TABS: 1.0000 | ORAL_TABLET | Freq: Every evening | ORAL | Status: DC | PRN

## 2021-09-25 MED ORDER — BISACODYL 5 MG PO TBEC: 5.0000 mg | DELAYED_RELEASE_TABLET | Freq: Every day | ORAL | Status: DC | PRN

## 2021-09-25 MED ORDER — TECHNETIUM TO 99M ALBUMIN AGGREGATED
3.9000 | Freq: Once | INTRAVENOUS | Status: AC | PRN
  Administered 2021-09-25: 3.9 via INTRAVENOUS

## 2021-09-25 MED ORDER — ONDANSETRON HCL 4 MG PO TABS: 4.0000 mg | ORAL_TABLET | Freq: Four times a day (QID) | ORAL | Status: DC | PRN

## 2021-09-25 MED ORDER — SODIUM CHLORIDE 0.9 % IV SOLN: INTRAVENOUS | Status: DC

## 2021-09-25 MED ORDER — PANTOPRAZOLE SODIUM 40 MG IV SOLR
40.0000 mg | Freq: Two times a day (BID) | INTRAVENOUS | Status: DC
  Administered 2021-09-25 – 2021-09-27 (×4): 40 mg via INTRAVENOUS
  Filled 2021-09-25 (×4): qty 40

## 2021-09-25 MED ORDER — SODIUM CHLORIDE 0.9 % IV BOLUS
1000.0000 mL | Freq: Once | INTRAVENOUS | Status: AC
  Administered 2021-09-25: 1000 mL via INTRAVENOUS

## 2021-09-25 NOTE — ED Notes (Signed)
Bladder scanned pt---137ml in bladder. MD notified.

## 2021-09-25 NOTE — ED Notes (Signed)
Patient stepping outside for fresh air

## 2021-09-25 NOTE — ED Provider Notes (Signed)
[facility] EMERGENCY DEPARTMENT Provider Note   CSN: [id number] Arrival date & time: 09/24/21  1748     History  Chief Complaint  Patient presents with   Shortness of Breath    Denise Santana is a 41 y.o. female.  41 year old female with complaint of shortness of breath. Patient is on Xarelto for prior DVT of the right leg, prior PE, followed by vascular surgery with plan for ablation (most recent note from 09/19/21 negative for DVT, found to have significant reflux). Shortness of breath onset Friday (5 days ago), associated chest pain with exertion (gets up and walks). Chest pain located mid sternum, does not radiate, is intermittent, not having pain at this time. Patient was told she has a DVT in her leg and is concerned she felt something move in her leg this weekend prior to developing her symptoms and is concerned her clot has moved to her lungs.  Also reports nausea, vomiting, reports decreased stool output secondary to her vomiting. Also reports decrease in urine output and has not voided while she has been in the ER. Reports intermittent compliance with her Xarelto.       Home Medications Prior to Admission medications   Medication Sig Start Date End Date Taking? Authorizing Provider  rivaroxaban (XARELTO) 20 MG TABS tablet Take 1 tablet (20 mg total) by mouth daily with supper. 03/24/21 09/25/21 Yes Maxwell Caul, PA-C      Allergies    Patient has no known allergies.    Review of Systems   Review of Systems  Constitutional:  Negative for chills and fever.  Respiratory:  Positive for shortness of breath. Negative for cough.   Cardiovascular:  Positive for chest pain.  Gastrointestinal:  Positive for nausea and vomiting. Negative for abdominal pain, blood in stool, constipation and diarrhea.  Genitourinary:  Positive for decreased urine volume.  Musculoskeletal:  Negative for arthralgias and myalgias.  Skin:  Negative for wound.  Allergic/Immunologic:  Negative for immunocompromised state.  Neurological:  Negative for dizziness and weakness.  Psychiatric/Behavioral:  Negative for confusion.   All other systems reviewed and are negative.  Physical Exam Updated Vital Signs BP 91/65 (BP Location: Right Arm)    Pulse 75    Temp 97.8 F (36.6 C) (Oral)    Resp 20    LMP 09/15/2021 (Approximate)    SpO2 100%  Physical Exam Vitals and nursing note reviewed.  Constitutional:      General: She is not in acute distress.    Appearance: She is well-developed. She is not diaphoretic.  HENT:     Head: Normocephalic and atraumatic.  Cardiovascular:     Rate and Rhythm: Normal rate and regular rhythm.  Pulmonary:     Effort: Pulmonary effort is normal.     Breath sounds: Normal breath sounds. No decreased breath sounds.  Chest:     Chest wall: No tenderness.  Abdominal:     Palpations: Abdomen is soft.     Tenderness: There is no abdominal tenderness.  Musculoskeletal:     Comments: Chronic changes to right lower leg, swelling to right lower leg, reportedly unchanged  Skin:    General: Skin is warm and dry.     Findings: No erythema.     Nails: There is no clubbing.  Neurological:     Mental Status: She is alert and oriented to person, place, and time.  Psychiatric:        Behavior: Behavior normal.    ED Results /  Procedures / Treatments   Labs (all labs ordered are listed, but only abnormal results are displayed) Labs Reviewed  COMPREHENSIVE METABOLIC PANEL - Abnormal; Notable for the following components:      Result Value   CO2 18 (*)    BUN 27 (*)    Creatinine, Ser 2.15 (*)    Total Protein 8.2 (*)    Total Bilirubin 1.6 (*)    GFR, Estimated 29 (*)    All other components within normal limits  CBC WITH DIFFERENTIAL/PLATELET - Abnormal; Notable for the following components:   WBC 13.4 (*)    RBC 3.47 (*)    HCT 35.4 (*)    MCV 102.0 (*)    MCH 34.6 (*)    Neutro Abs 11.0 (*)    All other components within normal  limits  CBC WITH DIFFERENTIAL/PLATELET - Abnormal; Notable for the following components:   WBC 12.1 (*)    RBC 3.33 (*)    Hemoglobin 11.7 (*)    HCT 33.6 (*)    MCV 100.9 (*)    MCH 35.1 (*)    Neutro Abs 9.3 (*)    All other components within normal limits  BASIC METABOLIC PANEL - Abnormal; Notable for the following components:   Potassium 3.1 (*)    CO2 21 (*)    Glucose, Bld 132 (*)    BUN 41 (*)    Creatinine, Ser 3.43 (*)    GFR, Estimated 17 (*)    All other components within normal limits  TROPONIN I (HIGH SENSITIVITY) - Abnormal; Notable for the following components:   Troponin I (High Sensitivity) 29 (*)    All other components within normal limits  TROPONIN I (HIGH SENSITIVITY) - Abnormal; Notable for the following components:   Troponin I (High Sensitivity) 19 (*)    All other components within normal limits  RESP PANEL BY RT-PCR (FLU A&B, COVID) ARPGX2  URINE CULTURE  URINALYSIS, ROUTINE W REFLEX MICROSCOPIC  HIV ANTIBODY (ROUTINE TESTING W REFLEX)  I-STAT BETA HCG BLOOD, ED ([facility], [facility], [facility] ONLY)    EKG EKG Interpretation  Date/Time:  Monday September 24 2021 19:36:44 EST Ventricular Rate:  94 PR Interval:  122 QRS Duration: 84 QT Interval:  358 QTC Calculation: 447 R Axis:   53 Text Interpretation: Normal sinus rhythm Nonspecific ST and T wave abnormality Abnormal ECG No old tracing to compare Confirmed by Pricilla Loveless [phone]) on 09/25/2021 9:48:28 AM  Radiology DG Chest 2 View  Result Date: 09/24/2021 CLINICAL DATA:  Shortness of breath, coughing, vomiting. EXAM: CHEST - 2 VIEW COMPARISON:  PA and lateral 04/23/2021 FINDINGS: The heart size and mediastinal contours are within normal limits. Both lungs are clear of infiltrates with scattered linear scar-like opacities again in both bases. On the PA view there is a questionable 1 cm irregular nodule superimposing along the horizontal fissure either in the base of the right upper lobe or the superior segment of  the right lower lobe, not localized on the lateral view. Follow-up CT is recommended preferably with contrast. The visualized skeletal structures are unremarkable apart from slight thoracic dextroscoliosis. IMPRESSION: 1. No evidence of acute chest disease, with scattered linear scar-like opacities. 2. Right mid lung irregular nodule or confluence of scar-like markings on the PA view only. Chest CT recommended for follow-up evaluation. Electronically Signed   By: Almira Bar M.D.   On: 09/24/2021 20:31   NM Pulmonary Perfusion  Result Date: 09/25/2021 CLINICAL DATA:  Shortness of breath since  Saturday. Intermittent cough. EXAM: NUCLEAR MEDICINE PERFUSION LUNG SCAN TECHNIQUE: Perfusion images were obtained in multiple projections after intravenous injection of radiopharmaceutical. Ventilation scans intentionally deferred if perfusion scan and chest x-ray adequate for interpretation during COVID 19 epidemic. RADIOPHARMACEUTICALS:  3.9 mCi Tc-7743m MAA IV COMPARISON:  Chest x-ray 09/24/2021 FINDINGS: Largely linear zone of decreased radiopharmaceutical in the right lung along with a small basilar subsegmental defect. These appear to correlate with areas of atelectasis on the recent chest x-ray. The remainder of the lungs demonstrate normal perfusion. No findings suspicious for pulmonary embolism. IMPRESSION: Areas of atelectasis in the right lung with loss of volume correlating with the perfusion abnormalities. No findings suspicious for pulmonary embolism. Electronically Signed   By: Rudie MeyerP.  Gallerani M.D.   On: 09/25/2021 13:55    Procedures .Critical Care Performed by: Jeannie FendMurphy, Makaiyah Schweiger A, PA-C Authorized by: Jeannie FendMurphy, Cayetano Mikita A, PA-C   Critical care provider statement:    Critical care time (minutes):  30   Critical care was time spent personally by me on the following activities:  Development of treatment plan with patient or surrogate, discussions with consultants, evaluation of patient's response to treatment,  examination of patient, ordering and review of laboratory studies, ordering and review of radiographic studies, ordering and performing treatments and interventions, pulse oximetry, re-evaluation of patient's condition and review of old charts    Medications Ordered in ED Medications  0.9 %  sodium chloride infusion ( Intravenous New Bag/Given 09/25/21 1618)  pantoprazole (PROTONIX) injection 40 mg (has no administration in time range)  cefTRIAXone (ROCEPHIN) 1 g in sodium chloride 0.9 % 100 mL IVPB (has no administration in time range)  HYDROcodone-acetaminophen (NORCO/VICODIN) 5-325 MG per tablet 1-2 tablet (has no administration in time range)  senna-docusate (Senokot-S) tablet 1 tablet (has no administration in time range)  bisacodyl (DULCOLAX) EC tablet 5 mg (has no administration in time range)  ondansetron (ZOFRAN) tablet 4 mg (has no administration in time range)    Or  ondansetron (ZOFRAN) injection 4 mg (has no administration in time range)  albuterol (PROVENTIL) (2.5 MG/3ML) 0.083% nebulizer solution 2.5 mg (has no administration in time range)  ondansetron (ZOFRAN-ODT) disintegrating tablet 4 mg (4 mg Oral Given 09/24/21 2308)  technetium albumin aggregated (MAA) injection solution 3.9 millicurie (3.9 millicuries Intravenous Contrast Given 09/25/21 1216)  sodium chloride 0.9 % bolus 1,000 mL (0 mLs Intravenous Stopped 09/25/21 1618)  sodium chloride 0.9 % bolus 1,000 mL (1,000 mLs Intravenous New Bag/Given 09/25/21 1617)  potassium chloride SA (KLOR-CON M) CR tablet 40 mEq (40 mEq Oral Given 09/25/21 1619)    ED Course/ Medical Decision Making/ A&P                            Medical Decision Making  This patient presents to the ED for concern of chest pain, shortness of breath, vomiting, this involves an extensive number of treatment options, and is a complaint that carries with it a high risk of complications and morbidity.  The differential diagnosis includes but not limited to PE,  arrhythmia, MI, dehydration, gastritis, electrolyte derangement.   Co morbidities that complicate the patient evaluation  History of PE and DVT   Additional history obtained:  External records from outside source obtained and reviewed including vascular clinic notes from earlier this year.  DVT study from August and October of last year   Lab Tests:  I Ordered, and personally interpreted labs.  The pertinent results include: CBC,  CMP with repeat CBC and BMP.  Troponin x2, hCG, COVID/flu.  Patient is found to have elevated creatinine on her initial CMP with initial creatinine of 2.15 with a baseline of 1.  Repeat BMP with worsening AKI with creatinine now 3.43.  CBC with mild leukocytosis is nonspecific at 12,000.  Patient's troponins are.  EKG does not suggest STEMI.  hCG negative.  Urinalysis not collected as patient states she has not voided while she has been in the ER lobby for 20 hours.  Also found to have mild hypokalemia with potassium of 3.1   Imaging Studies ordered:  I ordered imaging studies including chest x-ray and VQ scan I independently visualized and interpreted imaging which showed nonspecific changes on chest x-ray.  Unable to order CTA to evaluate for PE due to AKI, patient had a VQ scan I agree with the radiologist interpretation   Medicines ordered and prescription drug management:  I ordered medication including IV fluids and potassium for dehydration and hypokalemia Reevaluation of the patient after these medicines showed that the patient stayed the same I have reviewed the patients home medicines and have made adjustments as needed     Critical Interventions:  IV fluids for AKI   Consultations Obtained:  I requested consultation with the Dr. Sunnie Nielsenegalado with [facility],  and discussed lab and imaging findings as well as pertinent plan - they recommend: admission    Problem List / ED Course:  41 year old female presents as above with  chest pain, shortness of breath, vomiting with decreased urinary output.  On exam the patient is overall well-appearing in no distress.  Her lungs are clear to auscultation, her abdomen is soft and nontender.  Her right leg has some swelling with chronic skin changes to the ankle. Review of labs which were obtained in triage 20 hours ago upon arrival, patient with AKI, repeat labs ordered with worsening AKI.  Patient was given IV fluids and oral potassium supplement.  Consult to hospitalist for admission.   Reevaluation:  After the interventions noted above, I reevaluated the patient and found that they have :stayed the same   Dispostion:  After consideration of the diagnostic results and the patients response to treatment, I feel that the patent would benefit from admission to the hospital for further evaluation and treatment.          Final Clinical Impression(s) / ED Diagnoses Final diagnoses:  AKI (acute kidney injury) [facility](HCC)    Rx / DC Orders ED Discharge Orders     None         Alden HippMurphy, Zachary Nole A, PA-C 09/25/21 1645    Gerhard MunchLockwood, Robert, MD 09/26/21 [phone]

## 2021-09-25 NOTE — ED Notes (Signed)
Patient given towels and brief to change

## 2021-09-25 NOTE — ED Notes (Signed)
Called CT they will be ready by 900 or [phone]

## 2021-09-25 NOTE — Progress Notes (Signed)
ANTICOAGULATION CONSULT NOTE - Initial Consult  Pharmacy Consult for Enoxaparin Indication:  Hx of DVT on Xarelto  No Known Allergies  Patient Measurements: Vital Signs: Temp: 97.8 F (36.6 C) (01/10 1133) Temp Source: Oral (01/10 1133) BP: 91/65 (01/10 1133) Pulse Rate: 75 (01/10 1133)  Labs: Recent Labs    09/24/21 1956 09/24/21 2352 09/25/21 1422  HGB 12.0  --  11.7*  HCT 35.4*  --  33.6*  PLT 219  --  217  CREATININE 2.15*  --  3.43*  TROPONINIHS 29* 19*  --     Estimated Creatinine Clearance: 29.8 mL/min (A) (by C-G formula based on SCr of 3.43 mg/dL (H)).   Medical History: Past Medical History:  Diagnosis Date   DVT (deep venous thrombosis) (HCC)     Medications:  (Not in a hospital admission)  Scheduled:   pantoprazole (PROTONIX) IV  40 mg Intravenous Q12H   Infusions:   sodium chloride 125 mL/hr at 09/25/21 1618   cefTRIAXone (ROCEPHIN)  IV      Assessment: 54 yof with a history of DVT, appendectomy. Patient presenting with SOB/CP/NV. Enoxaparin consult placed for Hx of DVT on Xarelto. V-Q scan negative for PE.  Patient is on xarelto prior to arrival. Last taken 1/9 at 0930.  Hgb 11.7; plt 217.  Goal of Therapy:  Monitor platelets by anticoagulation protocol: Yes   Plan:  Enoxaparin 126mg  q24h Renal function is borderline for q12h dosing -- will need to  monitor closely Monitor CBC and for s/s of bleeding  , PharmD, BCPS 09/25/2021 4:53 PM ED Clinical Pharmacist -  [phone]

## 2021-09-25 NOTE — H&P (Signed)
History and Physical  Denise Santana [id number]:[id number] DOB: [birth year] DOA: 09/24/2021  PCP: Lewis Moccasin, MD Patient coming from: [facility]   I have personally briefly reviewed patient's old medical records in [facility]   Chief Complaint: Chest pain/shortness of breath/nausea vomiting  HPI: Denise Santana is a 41 y.o. female medical history significant for DVT, appendectomy, she is on Xarelto.  Presents complaining of chest pain/shortness of breath.  She reported chest pain and shortness of breath that started 4 days prior to admission, described chest pain sharp cramping "like if she is having a baby".  Chest pain was intermittent.  She is currently chest pain-free.  She also noticed worsening right lower extremity edema since 4 days ago.  She also report vomiting for the last 4 days, greenish fluid content.  She denies abdominal pain.  She had episode of diarrhea on Saturday, watery stool.  She also reports chills, productive cough.  The last time that she take ibuprofen was 2 weeks prior to this admission.  She has been compliant with her Xarelto.  She has a history of stent placement in her kidney 2021.   Evaluation in the ED: Sodium 136, potassium 3.1, BUN 41, creatinine 3.4, hemoglobin 11, white blood cell 12, MCV 100.9, troponin 29---19.  Bilirubin 1.6, AST ALT normal.  Blood pressure has been soft in the 90s.  She has received 2 L of IV fluids.    Review of Systems: All systems reviewed and apart from history of presenting illness, are negative.  Past Medical History:  Diagnosis Date   DVT (deep venous thrombosis) (HCC)    Past Surgical History:  Procedure Laterality Date   APPENDECTOMY     KIDNEY SURGERY     Social History:  reports that she has been smoking cigarettes. She has never used smokeless tobacco. She reports that she does not drink alcohol and does not use drugs.   No Known Allergies  Family History  Problem Relation Age of Onset   Hypertension Mother     Hyperlipidemia Mother    Diabetes Mother    Heart failure Mother     Prior to Admission medications   Medication Sig Start Date End Date Taking? Authorizing Provider  rivaroxaban (XARELTO) 20 MG TABS tablet Take 1 tablet (20 mg total) by mouth daily with supper. 03/24/21 09/25/21 Yes Maxwell Caul, PA-C   Physical Exam: Vitals:   09/24/21 2338 09/25/21 0357 09/25/21 0647 09/25/21 1133  BP: 99/67 102/81 (!) 95/58 91/65  Pulse: 90 83 72 75  Resp: 18 16 16 20   Temp: 98.5 F (36.9 C) 97.7 F (36.5 C)  97.8 F (36.6 C)  TempSrc:    Oral  SpO2: 97% 100% 100% 100%    General exam: Moderately built and nourished patient, lying comfortably supine on the gurney in no obvious distress. Head, eyes and ENT: Nontraumatic and normocephalic. Pupils equally reacting to light and accommodation. Oral mucosa moist. Neck: Supple. No JVD, carotid bruit or thyromegaly. Lymphatics: No lymphadenopathy. Respiratory system: Clear to auscultation. No increased work of breathing. Cardiovascular system: S1 and S2 heard, RRR. No JVD, murmurs, gallops, clicks or pedal edema. Gastrointestinal system: Abdomen is nondistended, soft and nontender. Normal bowel sounds heard. No organomegaly or masses appreciated. Central nervous system: Alert and oriented. No focal neurological deficits. Extremities: Symmetric 5 x 5 power. Peripheral pulses symmetrically felt. Right LE with Edema plus 2.  Skin: Hyperpigmentation changes Right ankle.  Psychiatry: Pleasant and cooperative.   Labs on Admission:  Basic Metabolic Panel: Recent Labs  Lab 09/24/21 1956 09/25/21 1422  NA 138 136  K 3.5 3.1*  CL 108 100  CO2 18* 21*  GLUCOSE 97 132*  BUN 27* 41*  CREATININE 2.15* 3.43*  CALCIUM 8.9 8.9   Liver Function Tests: Recent Labs  Lab 09/24/21 1956  AST 15  ALT 16  ALKPHOS 92  BILITOT 1.6*  PROT 8.2*  ALBUMIN 3.9   No results for input(s): LIPASE, AMYLASE in the last 168 hours. No results for input(s):  AMMONIA in the last 168 hours. CBC: Recent Labs  Lab 09/24/21 1956 09/25/21 1422  WBC 13.4* 12.1*  NEUTROABS 11.0* 9.3*  HGB 12.0 11.7*  HCT 35.4* 33.6*  MCV 102.0* 100.9*  PLT 219 217   Cardiac Enzymes: No results for input(s): CKTOTAL, CKMB, CKMBINDEX, TROPONINI in the last 168 hours.  BNP (last 3 results) No results for input(s): PROBNP in the last 8760 hours. CBG: No results for input(s): GLUCAP in the last 168 hours.  Radiological Exams on Admission: DG Chest 2 View  Result Date: 09/24/2021 CLINICAL DATA:  Shortness of breath, coughing, vomiting. EXAM: CHEST - 2 VIEW COMPARISON:  PA and lateral 04/23/2021 FINDINGS: The heart size and mediastinal contours are within normal limits. Both lungs are clear of infiltrates with scattered linear scar-like opacities again in both bases. On the PA view there is a questionable 1 cm irregular nodule superimposing along the horizontal fissure either in the base of the right upper lobe or the superior segment of the right lower lobe, not localized on the lateral view. Follow-up CT is recommended preferably with contrast. The visualized skeletal structures are unremarkable apart from slight thoracic dextroscoliosis. IMPRESSION: 1. No evidence of acute chest disease, with scattered linear scar-like opacities. 2. Right mid lung irregular nodule or confluence of scar-like markings on the PA view only. Chest CT recommended for follow-up evaluation. Electronically Signed   By: Almira Bar M.D.   On: 09/24/2021 20:31   NM Pulmonary Perfusion  Result Date: 09/25/2021 CLINICAL DATA:  Shortness of breath since Saturday. Intermittent cough. EXAM: NUCLEAR MEDICINE PERFUSION LUNG SCAN TECHNIQUE: Perfusion images were obtained in multiple projections after intravenous injection of radiopharmaceutical. Ventilation scans intentionally deferred if perfusion scan and chest x-ray adequate for interpretation during COVID 19 epidemic. RADIOPHARMACEUTICALS:  3.9 mCi  Tc-82m MAA IV COMPARISON:  Chest x-ray 09/24/2021 FINDINGS: Largely linear zone of decreased radiopharmaceutical in the right lung along with a small basilar subsegmental defect. These appear to correlate with areas of atelectasis on the recent chest x-ray. The remainder of the lungs demonstrate normal perfusion. No findings suspicious for pulmonary embolism. IMPRESSION: Areas of atelectasis in the right lung with loss of volume correlating with the perfusion abnormalities. No findings suspicious for pulmonary embolism. Electronically Signed   By: Rudie Meyer M.D.   On: 09/25/2021 13:55    EKG: Independently reviewed.   Assessment/Plan Principal Problem:   AKI (acute kidney injury) (HCC) Active Problems:   Hypokalemia   History of DVT (deep vein thrombosis)   Nausea vomiting and diarrhea   Chest pain   1-AKI; suspect related to hypovolemia, Hypoperfusion:  Prior Cr at 1.1, five month ago. Presents with cr 2.1---3.3 Continue with IV fluids.  She has a history of stent placement. Plan to proceed with Renal US.  Will cover with IV ceftriaxone.  UA, Urine culture ordered.  Bladder scan.  Strict I and O.   2-Chest pain:  EKG no significant ST elevation.  Troponin very mildly  elevated 29--19. Will get ECHO.  V-Q scan negative for PE>  Chest pain Free.  Start IV Protonix.   3-Nausea, vomiting, episode of diarrhea;  Suspect gastroenteritis.  Continue with IV fluids.  IV Protonix, PRN Zofran.  Check RUQ US.   4-Leukocytosis; ?Hypotension.  Cover with IV ceftriaxone. Awaiting UA. Urine culture.  IV fluids.  Chest x ray negative for infection.   5-Abnormal Chest x ray: Right mid lung irregular nodule or confluence of scar-like markings on the PA view only. Chest CT recommended for follow-up evaluation. -she will need CT chest.   6-RLE Edema;  -History of C4 venous diseases,Chronic venous insufficiency/  reflux greater saphenous vein. -Follows with Dr Randie Heinzain. He is planning  saphenous vein abl;ation out patient.  -HO of DVT, hold xarelto due to AKI, Lovenox per pharmacy to dose.  -Worse edema, check doppler.   DVT Prophylaxis: Lovenox , hold xarelto due to AKI, and incase required procedure.  Code Status: Full Code Family Communication: Care discussed with patient.  Disposition Plan: admit under observation for IV fluids and further evaluation of AKI.   Time spent: 75 minutes.   Alba CoryBelkys A Maikel Neisler MD [facility]   09/25/2021, 4:31 PM

## 2021-09-26 ENCOUNTER — Observation Stay (HOSPITAL_COMMUNITY): Payer: Medicare Other

## 2021-09-26 ENCOUNTER — Other Ambulatory Visit: Payer: Self-pay

## 2021-09-26 ENCOUNTER — Encounter (HOSPITAL_COMMUNITY): Payer: Self-pay | Admitting: Internal Medicine

## 2021-09-26 DIAGNOSIS — Z6841 Body Mass Index (BMI) 40.0 and over, adult: Secondary | ICD-10-CM | POA: Diagnosis not present

## 2021-09-26 DIAGNOSIS — Z86718 Personal history of other venous thrombosis and embolism: Secondary | ICD-10-CM | POA: Diagnosis not present

## 2021-09-26 DIAGNOSIS — D72829 Elevated white blood cell count, unspecified: Secondary | ICD-10-CM | POA: Diagnosis present

## 2021-09-26 DIAGNOSIS — R079 Chest pain, unspecified: Secondary | ICD-10-CM | POA: Diagnosis present

## 2021-09-26 DIAGNOSIS — R600 Localized edema: Secondary | ICD-10-CM | POA: Diagnosis present

## 2021-09-26 DIAGNOSIS — E861 Hypovolemia: Secondary | ICD-10-CM | POA: Diagnosis present

## 2021-09-26 DIAGNOSIS — F17210 Nicotine dependence, cigarettes, uncomplicated: Secondary | ICD-10-CM | POA: Diagnosis present

## 2021-09-26 DIAGNOSIS — E8721 Acute metabolic acidosis: Secondary | ICD-10-CM | POA: Diagnosis present

## 2021-09-26 DIAGNOSIS — R609 Edema, unspecified: Secondary | ICD-10-CM

## 2021-09-26 DIAGNOSIS — Z7901 Long term (current) use of anticoagulants: Secondary | ICD-10-CM | POA: Diagnosis not present

## 2021-09-26 DIAGNOSIS — K529 Noninfective gastroenteritis and colitis, unspecified: Secondary | ICD-10-CM | POA: Diagnosis present

## 2021-09-26 DIAGNOSIS — E876 Hypokalemia: Secondary | ICD-10-CM | POA: Diagnosis not present

## 2021-09-26 DIAGNOSIS — Z20822 Contact with and (suspected) exposure to covid-19: Secondary | ICD-10-CM | POA: Diagnosis present

## 2021-09-26 DIAGNOSIS — N179 Acute kidney failure, unspecified: Secondary | ICD-10-CM | POA: Diagnosis present

## 2021-09-26 DIAGNOSIS — Z86711 Personal history of pulmonary embolism: Secondary | ICD-10-CM | POA: Diagnosis not present

## 2021-09-26 DIAGNOSIS — Z8249 Family history of ischemic heart disease and other diseases of the circulatory system: Secondary | ICD-10-CM | POA: Diagnosis not present

## 2021-09-26 DIAGNOSIS — D539 Nutritional anemia, unspecified: Secondary | ICD-10-CM | POA: Diagnosis present

## 2021-09-26 DIAGNOSIS — I872 Venous insufficiency (chronic) (peripheral): Secondary | ICD-10-CM | POA: Diagnosis present

## 2021-09-26 DIAGNOSIS — E860 Dehydration: Secondary | ICD-10-CM | POA: Diagnosis present

## 2021-09-26 LAB — CBC
HCT: 28.6 % — ABNORMAL LOW (ref 36.0–46.0)
Hemoglobin: 10 g/dL — ABNORMAL LOW (ref 12.0–15.0)
MCH: 35.1 pg — ABNORMAL HIGH (ref 26.0–34.0)
MCHC: 35 g/dL (ref 30.0–36.0)
MCV: 100.4 fL — ABNORMAL HIGH (ref 80.0–100.0)
Platelets: 186 10*3/uL (ref 150–400)
RBC: 2.85 MIL/uL — ABNORMAL LOW (ref 3.87–5.11)
RDW: 14.9 % (ref 11.5–15.5)
WBC: 6.8 10*3/uL (ref 4.0–10.5)
nRBC: 0 % (ref 0.0–0.2)

## 2021-09-26 LAB — COMPREHENSIVE METABOLIC PANEL
ALT: 11 U/L (ref 0–44)
AST: 11 U/L — ABNORMAL LOW (ref 15–41)
Albumin: 2.9 g/dL — ABNORMAL LOW (ref 3.5–5.0)
Alkaline Phosphatase: 66 U/L (ref 38–126)
Anion gap: 10 (ref 5–15)
BUN: 35 mg/dL — ABNORMAL HIGH (ref 6–20)
CO2: 18 mmol/L — ABNORMAL LOW (ref 22–32)
Calcium: 7.6 mg/dL — ABNORMAL LOW (ref 8.9–10.3)
Chloride: 114 mmol/L — ABNORMAL HIGH (ref 98–111)
Creatinine, Ser: 2.48 mg/dL — ABNORMAL HIGH (ref 0.44–1.00)
GFR, Estimated: 25 mL/min — ABNORMAL LOW (ref >60)
Glucose, Bld: 77 mg/dL (ref 70–99)
Potassium: 3.6 mmol/L (ref 3.5–5.1)
Sodium: 142 mmol/L (ref 135–145)
Total Bilirubin: 0.9 mg/dL (ref 0.3–1.2)
Total Protein: 5.8 g/dL — ABNORMAL LOW (ref 6.5–8.1)

## 2021-09-26 LAB — URINE CULTURE: Culture: NO GROWTH

## 2021-09-26 LAB — ECHOCARDIOGRAM COMPLETE
Area-P 1/2: 1.91 cm2
Calc EF: 51.2 %
S' Lateral: 3.8 cm
Single Plane A2C EF: 50.5 %
Single Plane A4C EF: 53.1 %

## 2021-09-26 LAB — HIV ANTIBODY (ROUTINE TESTING W REFLEX): HIV Screen 4th Generation wRfx: NONREACTIVE

## 2021-09-26 MED ORDER — ENOXAPARIN SODIUM 120 MG/0.8ML IJ SOSY
120.0000 mg | PREFILLED_SYRINGE | Freq: Two times a day (BID) | INTRAMUSCULAR | Status: DC
  Administered 2021-09-26 – 2021-09-27 (×3): 120 mg via SUBCUTANEOUS
  Filled 2021-09-26 (×4): qty 0.8

## 2021-09-26 MED ORDER — ALUM & MAG HYDROXIDE-SIMETH 200-200-20 MG/5ML PO SUSP: 15.0000 mL | ORAL | Status: DC | PRN

## 2021-09-26 NOTE — Progress Notes (Signed)
°  Echocardiogram 2D Echocardiogram has been performed.  Denise Santana 09/26/2021, 9:59 AM

## 2021-09-26 NOTE — Progress Notes (Signed)
ANTICOAGULATION CONSULT NOTE - Initial Consult  Pharmacy Consult for Enoxaparin Indication:  Hx of DVT on Xarelto  No Known Allergies  Patient Measurements: Vital Signs: Temp: 97.4 F (36.3 C) (01/11 0736) Temp Source: Oral (01/11 0736) BP: 105/39 (01/11 0736) Pulse Rate: 63 (01/11 0736)  Labs: Recent Labs    09/24/21 1956 09/24/21 2352 09/25/21 1422 09/26/21 0457  HGB 12.0  --  11.7* 10.0*  HCT 35.4*  --  33.6* 28.6*  PLT 219  --  217 186  CREATININE 2.15*  --  3.43* 2.48*  TROPONINIHS 29* 19*  --   --      Estimated Creatinine Clearance: 41.2 mL/min (A) (by C-G formula based on SCr of 2.48 mg/dL (H)).   Medical History: Past Medical History:  Diagnosis Date   DVT (deep venous thrombosis) (HCC)     Medications:  Medications Prior to Admission  Medication Sig Dispense Refill Last Dose   rivaroxaban (XARELTO) 20 MG TABS tablet Take 1 tablet (20 mg total) by mouth daily with supper. 60 tablet 0 09/24/2021 at 0930   Scheduled:   enoxaparin (LOVENOX) injection  120 mg Subcutaneous Q12H   pantoprazole (PROTONIX) IV  40 mg Intravenous Q12H   Infusions:   sodium chloride 125 mL/hr at 09/25/21 1618   cefTRIAXone (ROCEPHIN)  IV Stopped (09/25/21 1824)    Assessment: 40 yof with a history of DVT, appendectomy. Patient presenting with SOB/CP/NV. Enoxaparin consult placed for Hx of DVT on Xarelto. V-Q scan negative for PE.  Patient is on xarelto prior to arrival. Last taken 1/9 at 0930.  Scr improved from 3.4 to 2.48 this morning. CBC stable overnight.  Patient now qualifies for bid dosing, will adjust.   Goal of Therapy:  Monitor platelets by anticoagulation protocol: Yes   Plan:  Enoxaparin 120mg  sq bid Monitor CBC and for s/s of bleeding  PharmD., BCPS Clinical Pharmacist 09/26/2021 8:10 AM

## 2021-09-26 NOTE — Progress Notes (Signed)
Patient ID: Monico BlitzOphelia Dismuke, female   DOB: Aug 08, 1981, 41 y.o.   MRN: [id number]  PROGRESS NOTE    Monico BlitzOphelia Cowing  [id number] DOB: Aug 08, 1981 DOA: 09/24/2021 PCP: Lewis Moccasinewey, Elizabeth R, MD   Brief Narrative:  41 year old female with history of DVT on Xarelto, appendectomy presented with chest pain, shortness of breath, nausea and vomiting.  On presentation, troponin was 29 and 19; creatinine was 3.4.  She was started on IV fluids.  Assessment & Plan:   Acute kidney injury Acute metabolic acidosis -Possibly from dehydration and hypovolemia -Prior creatinine was 1.1, 5 months ago.  Presented with creatinine of 3.43. -Currently on IV fluids.  Creatinine improving to 2.48 today.  Ultrasound of abdomen did not show any evidence of hydronephrosis. -UA not suggestive of UTI.  DC Rocephin  Chest pain -EKG unremarkable.  Troponins did not trend up: 29 and 19.  Echo pending.  VQ scan negative for PE.  Currently chest pain-free.  Continue Protonix  Nausea, vomiting, diarrhea -Possibly from gastroenteritis.  Symptoms improving.  Advance diet to soft diet.  Continue IV fluids.  Ultrasound of abdomen was negative for any abnormality  Leukocytosis -Resolved  Macrocytic anemia -Questionable cause.  Hemoglobin stable.  Abnormal chest x-ray -Right midlung irregular nodule or confluence of scarlike markings on the PA view only.  CT chest can be obtained as an outpatient.  Right lower extremity edema with chronic skin changes  -History of chronic venous insufficiency.  Follows up with Dr. Florene Routeain/vascular surgery: Planning for saphenous vein ablation as an outpatient  History of DVT -On Xarelto as an outpatient.  Currently on Lovenox because of acute kidney injury.  Will possibly switch to Xarelto tomorrow once renal function improves.  Morbid obesity -Outpatient follow-up   DVT prophylaxis: Lovenox Code Status: Full Family Communication: None at bedside Disposition Plan: Status is: Observation  The  patient will require care spanning > 2 midnights and should be moved to inpatient because: Of need for IV fluids for acute kidney injury  Consultants: None  Procedures: None  Antimicrobials: Rocephin from 09/25/2021 onwards   Subjective: Patient seen and examined at bedside.  Denies any current chest pain.  Feels better and wants to eat solid food.  Denies worsening abdominal pain, shortness of breath or diarrhea.  Objective: Vitals:   09/25/21 2332 09/26/21 0418 09/26/21 0419 09/26/21 0736  BP: (!) 103/51 97/63  (!) 105/39  Pulse: 68 70  63  Resp: 18 16  17   Temp: 98.2 F (36.8 C) 98 F (36.7 C)  (!) 97.4 F (36.3 C)  TempSrc: Oral Oral  Oral  SpO2: 98% 100% 100% 100%    Intake/Output Summary (Last 24 hours) at 09/26/2021 1015 Last data filed at 09/26/2021 0900 Gross per 24 hour  Intake 2765.74 ml  Output 640 ml  Net 2125.74 ml   There were no vitals filed for this visit.  Examination:  General exam: Appears calm and comfortable.  Currently on room air. Respiratory system: Bilateral decreased breath sounds at bases Cardiovascular system: S1 & S2 heard, Rate controlled Gastrointestinal system: Abdomen is morbidly obese, nondistended, soft and nontender. Normal bowel sounds heard. Extremities: No cyanosis, clubbing; trace lower extremity edema Central nervous system: Alert and oriented. No focal neurological deficits. Moving extremities Skin: Chronic right lower extremity skin changes with hyperpigmentation present.  No obvious ecchymosis Psychiatry: Judgement and insight appear normal. Mood & affect appropriate.     Data Reviewed: I have personally reviewed following labs and imaging studies  CBC: Recent Labs  Lab  09/24/21 1956 09/25/21 1422 09/26/21 0457  WBC 13.4* 12.1* 6.8  NEUTROABS 11.0* 9.3*  --   HGB 12.0 11.7* 10.0*  HCT 35.4* 33.6* 28.6*  MCV 102.0* 100.9* 100.4*  PLT 219 217 186   Basic Metabolic Panel: Recent Labs  Lab 09/24/21 1956  09/25/21 1422 09/26/21 0457  NA 138 136 142  K 3.5 3.1* 3.6  CL 108 100 114*  CO2 18* 21* 18*  GLUCOSE 97 132* 77  BUN 27* 41* 35*  CREATININE 2.15* 3.43* 2.48*  CALCIUM 8.9 8.9 7.6*   GFR: Estimated Creatinine Clearance: 41.2 mL/min (A) (by C-G formula based on SCr of 2.48 mg/dL (H)). Liver Function Tests: Recent Labs  Lab 09/24/21 1956 09/26/21 0457  AST 15 11*  ALT 16 11  ALKPHOS 92 66  BILITOT 1.6* 0.9  PROT 8.2* 5.8*  ALBUMIN 3.9 2.9*   No results for input(s): LIPASE, AMYLASE in the last 168 hours. No results for input(s): AMMONIA in the last 168 hours. Coagulation Profile: No results for input(s): INR, PROTIME in the last 168 hours. Cardiac Enzymes: No results for input(s): CKTOTAL, CKMB, CKMBINDEX, TROPONINI in the last 168 hours. BNP (last 3 results) No results for input(s): PROBNP in the last 8760 hours. HbA1C: No results for input(s): HGBA1C in the last 72 hours. CBG: No results for input(s): GLUCAP in the last 168 hours. Lipid Profile: No results for input(s): CHOL, HDL, LDLCALC, TRIG, CHOLHDL, LDLDIRECT in the last 72 hours. Thyroid Function Tests: No results for input(s): TSH, T4TOTAL, FREET4, T3FREE, THYROIDAB in the last 72 hours. Anemia Panel: No results for input(s): VITAMINB12, FOLATE, FERRITIN, TIBC, IRON, RETICCTPCT in the last 72 hours. Sepsis Labs: No results for input(s): PROCALCITON, LATICACIDVEN in the last 168 hours.  Recent Results (from the past 240 hour(s))  Resp Panel by RT-PCR (Flu A&B, Covid) Nasopharyngeal Swab     Status: None   Collection Time: 09/24/21  9:15 PM   Specimen: Nasopharyngeal Swab; Nasopharyngeal(NP) swabs in vial transport medium  Result Value Ref Range Status   SARS Coronavirus 2 by RT PCR NEGATIVE NEGATIVE Final    Comment: (NOTE) SARS-CoV-2 target nucleic acids are NOT DETECTED.  The SARS-CoV-2 RNA is generally detectable in upper respiratory specimens during the acute phase of infection. The  lowest concentration of SARS-CoV-2 viral copies this assay can detect is 138 copies/mL. A negative result does not preclude SARS-Cov-2 infection and should not be used as the sole basis for treatment or other patient management decisions. A negative result may occur with  improper specimen collection/handling, submission of specimen other than nasopharyngeal swab, presence of viral mutation(s) within the areas targeted by this assay, and inadequate number of viral copies(<138 copies/mL). A negative result must be combined with clinical observations, patient history, and epidemiological information. The expected result is Negative.  Fact Sheet for Patients:  BloggerCourse.comhttps://www.fda.gov/media/152166/download  Fact Sheet for Healthcare Providers:  SeriousBroker.ithttps://www.fda.gov/media/152162/download  This test is no t yet approved or cleared by the [place] FDA and  has been authorized for detection and/or diagnosis of SARS-CoV-2 by FDA under an Emergency Use Authorization (EUA). This EUA will remain  in effect (meaning this test can be used) for the duration of the COVID-19 declaration under Section 564(b)(1) of the Act, 21 U.S.C.section 360bbb-3(b)(1), unless the authorization is terminated  or revoked sooner.       Influenza A by PCR NEGATIVE NEGATIVE Final   Influenza B by PCR NEGATIVE NEGATIVE Final    Comment: (NOTE) The Xpert Xpress SARS-CoV-2/FLU/RSV plus  assay is intended as an aid in the diagnosis of influenza from Nasopharyngeal swab specimens and should not be used as a sole basis for treatment. Nasal washings and aspirates are unacceptable for Xpert Xpress SARS-CoV-2/FLU/RSV testing.  Fact Sheet for Patients: BloggerCourse.com  Fact Sheet for Healthcare Providers: SeriousBroker.it  This test is not yet approved or cleared by the [place] FDA and has been authorized for detection and/or diagnosis of SARS-CoV-2 by FDA under  an Emergency Use Authorization (EUA). This EUA will remain in effect (meaning this test can be used) for the duration of the COVID-19 declaration under Section 564(b)(1) of the Act, 21 U.S.C. section 360bbb-3(b)(1), unless the authorization is terminated or revoked.  Performed at [facility], [address], [place], [place] [address]          Radiology Studies: DG Chest 2 View  Result Date: 09/24/2021 CLINICAL DATA:  Shortness of breath, coughing, vomiting. EXAM: CHEST - 2 VIEW COMPARISON:  PA and lateral 04/23/2021 FINDINGS: The heart size and mediastinal contours are within normal limits. Both lungs are clear of infiltrates with scattered linear scar-like opacities again in both bases. On the PA view there is a questionable 1 cm irregular nodule superimposing along the horizontal fissure either in the base of the right upper lobe or the superior segment of the right lower lobe, not localized on the lateral view. Follow-up CT is recommended preferably with contrast. The visualized skeletal structures are unremarkable apart from slight thoracic dextroscoliosis. IMPRESSION: 1. No evidence of acute chest disease, with scattered linear scar-like opacities. 2. Right mid lung irregular nodule or confluence of scar-like markings on the PA view only. Chest CT recommended for follow-up evaluation. Electronically Signed   By: Almira Bar M.D.   On: 09/24/2021 20:31   NM Pulmonary Perfusion  Result Date: 09/25/2021 CLINICAL DATA:  Shortness of breath since Saturday. Intermittent cough. EXAM: NUCLEAR MEDICINE PERFUSION LUNG SCAN TECHNIQUE: Perfusion images were obtained in multiple projections after intravenous injection of radiopharmaceutical. Ventilation scans intentionally deferred if perfusion scan and chest x-ray adequate for interpretation during COVID 19 epidemic. RADIOPHARMACEUTICALS:  3.9 mCi Tc-67m MAA IV COMPARISON:  Chest x-ray 09/24/2021 FINDINGS: Largely linear zone of decreased  radiopharmaceutical in the right lung along with a small basilar subsegmental defect. These appear to correlate with areas of atelectasis on the recent chest x-ray. The remainder of the lungs demonstrate normal perfusion. No findings suspicious for pulmonary embolism. IMPRESSION: Areas of atelectasis in the right lung with loss of volume correlating with the perfusion abnormalities. No findings suspicious for pulmonary embolism. Electronically Signed   By: Rudie Meyer M.D.   On: 09/25/2021 13:55   US Abdomen Complete  Result Date: 09/26/2021 CLINICAL DATA:  Acute renal insufficiency. EXAM: ABDOMEN ULTRASOUND COMPLETE COMPARISON:  None. FINDINGS: Gallbladder: The gallbladder is not visualized. Common bile duct: Diameter: 4 mm Liver: No focal lesion identified. Within normal limits in parenchymal echogenicity. Portal vein is patent on color Doppler imaging with normal direction of blood flow towards the liver. IVC: No abnormality visualized. Pancreas: Visualized portion unremarkable. Spleen: Size and appearance within normal limits. Right Kidney: Length: 12.9 cm. Echogenicity within normal limits. No mass or hydronephrosis visualized. Left Kidney: Length: 10.9 cm. Normal echogenicity. Mild fullness of the left renal pelvis. No shadowing stone. Abdominal aorta: No aneurysm visualized. Other findings: None. IMPRESSION: 1. Mild fullness of the left renal collecting system, otherwise unremarkable abdominal ultrasound. 2. Nonvisualization of the gallbladder. Electronically Signed   By: Ceasar Mons.D.  On: 09/26/2021 01:27        Scheduled Meds:  enoxaparin (LOVENOX) injection  120 mg Subcutaneous Q12H   pantoprazole (PROTONIX) IV  40 mg Intravenous Q12H   Continuous Infusions:  sodium chloride 125 mL/hr at 09/25/21 1618   cefTRIAXone (ROCEPHIN)  IV Stopped (09/25/21 1824)          Glade Lloyd, MD [facility] 09/26/2021, 10:15 AM

## 2021-09-26 NOTE — Care Management Obs Status (Signed)
MEDICARE OBSERVATION STATUS NOTIFICATION   Patient Details  Name: Darlina Mccaughey MRN: [id number] Date of Birth: 1981-05-20   Medicare Observation Status Notification Given:  Yes    Lawerance Sabal, RN 09/26/2021, 4:03 PM

## 2021-09-26 NOTE — Progress Notes (Signed)
Right lower extremity venous duplex completed. Refer to "CV Proc" under chart review to view preliminary results.  09/26/2021 11:23 AM Eula Fried., MHA, RVT, RDCS, RDMS

## 2021-09-27 ENCOUNTER — Encounter (HOSPITAL_COMMUNITY): Payer: Self-pay | Admitting: Internal Medicine

## 2021-09-27 DIAGNOSIS — R112 Nausea with vomiting, unspecified: Secondary | ICD-10-CM

## 2021-09-27 DIAGNOSIS — N179 Acute kidney failure, unspecified: Principal | ICD-10-CM

## 2021-09-27 DIAGNOSIS — R197 Diarrhea, unspecified: Secondary | ICD-10-CM

## 2021-09-27 DIAGNOSIS — R079 Chest pain, unspecified: Secondary | ICD-10-CM

## 2021-09-27 DIAGNOSIS — Z86718 Personal history of other venous thrombosis and embolism: Secondary | ICD-10-CM

## 2021-09-27 LAB — COMPREHENSIVE METABOLIC PANEL
ALT: 11 U/L (ref 0–44)
AST: 9 U/L — ABNORMAL LOW (ref 15–41)
Albumin: 2.8 g/dL — ABNORMAL LOW (ref 3.5–5.0)
Alkaline Phosphatase: 64 U/L (ref 38–126)
Anion gap: 9 (ref 5–15)
BUN: 24 mg/dL — ABNORMAL HIGH (ref 6–20)
CO2: 17 mmol/L — ABNORMAL LOW (ref 22–32)
Calcium: 8 mg/dL — ABNORMAL LOW (ref 8.9–10.3)
Chloride: 116 mmol/L — ABNORMAL HIGH (ref 98–111)
Creatinine, Ser: 1.9 mg/dL — ABNORMAL HIGH (ref 0.44–1.00)
GFR, Estimated: 34 mL/min — ABNORMAL LOW (ref >60)
Glucose, Bld: 87 mg/dL (ref 70–99)
Potassium: 3.6 mmol/L (ref 3.5–5.1)
Sodium: 142 mmol/L (ref 135–145)
Total Bilirubin: 0.5 mg/dL (ref 0.3–1.2)
Total Protein: 5.6 g/dL — ABNORMAL LOW (ref 6.5–8.1)

## 2021-09-27 LAB — MAGNESIUM: Magnesium: 2.2 mg/dL (ref 1.7–2.4)

## 2021-09-27 MED ORDER — PANTOPRAZOLE SODIUM 40 MG PO TBEC: 40.0000 mg | DELAYED_RELEASE_TABLET | Freq: Every day | ORAL | 0 refills | Status: DC

## 2021-09-27 MED ORDER — ONDANSETRON HCL 4 MG PO TABS: 4.0000 mg | ORAL_TABLET | Freq: Four times a day (QID) | ORAL | 0 refills | Status: DC | PRN

## 2021-09-27 NOTE — Discharge Summary (Signed)
Physician Discharge Summary  Denise Santana [id number] DOB: Apr 30, 1981 DOA: 09/24/2021  PCP: Fanny Bien, MD  Admit date: 09/24/2021 Discharge date: 09/27/2021  Admitted From: Home Disposition: Home  Recommendations for Outpatient Follow-up:  Follow up with PCP in 1 week with repeat CBC/BMP Follow up in ED if symptoms worsen or new appear   Home Health: No Equipment/Devices: None  Discharge Condition: Stable CODE STATUS: Full Diet recommendation: Regular  Brief/Interim Summary: 41 year old female with history of DVT on Xarelto, appendectomy presented with chest pain, shortness of breath, nausea and vomiting.  On presentation, troponin was 29 and 19; creatinine was 3.4.  She was started on IV fluids.  During the hospitalization, her condition has improved.  Her creatinine is improving.  She is tolerating diet.  Work-up has been unremarkable so far.  She will be discharged home today with close outpatient follow-up with PCP.  Discharge Diagnoses:   Acute kidney injury Acute metabolic acidosis -Possibly from dehydration and hypovolemia -Prior creatinine was 1.1, 5 months ago.  Presented with creatinine of 3.43. -Currently on IV fluids.  Creatinine improving to 1.90 today.  Ultrasound of abdomen did not show any evidence of hydronephrosis. -UA not suggestive of UTI.  DC'd Rocephin -She is tolerating diet currently and hemodynamically stable.  Encouraged her to drink plenty of fluids.  She will be discharged home today with close outpatient follow-up with PCP with repeat BMP within a week.   Chest pain -EKG unremarkable.  Troponins did not trend up: 29 and 19.  Echo showed EF of 50 to 55%.  VQ scan negative for PE.  Currently chest pain-free.  Continue Protonix upon discharge.  Outpatient follow-up with PCP.   Nausea, vomiting, diarrhea -Possibly from gastroenteritis.  Symptoms improving.  Treated with IV fluids.  Currently tolerating soft diet.  Ultrasound of abdomen was negative  for any abnormality   Leukocytosis -Resolved  Macrocytic anemia -Questionable cause.  Hemoglobin stable.   Abnormal chest x-ray -Right midlung irregular nodule or confluence of scarlike markings on the PA view only.  CT chest can be obtained as an outpatient.   Right lower extremity edema with chronic skin changes  -History of chronic venous insufficiency.  Follows up with Dr. Tacey Ruiz surgery: Planning for saphenous vein ablation as an outpatient   History of DVT -On Xarelto as an outpatient.  Currently on Lovenox because of acute kidney injury.  Will switch to Xarelto t on discharge.  Morbid obesity -Outpatient follow-up  Discharge Instructions  Discharge Instructions     Diet general   Complete by: As directed    Increase activity slowly   Complete by: As directed       Allergies as of 09/27/2021   No Known Allergies      Medication List     TAKE these medications    ondansetron 4 MG tablet Commonly known as: ZOFRAN Take 1 tablet (4 mg total) by mouth every 6 (six) hours as needed for nausea.   pantoprazole 40 MG tablet Commonly known as: Protonix Take 1 tablet (40 mg total) by mouth daily for 15 days.   rivaroxaban 20 MG Tabs tablet Commonly known as: XARELTO Take 1 tablet (20 mg total) by mouth daily with supper.        No Known Allergies  Consultations: None   Procedures/Studies: DG Chest 2 View  Result Date: 09/24/2021 CLINICAL DATA:  Shortness of breath, coughing, vomiting. EXAM: CHEST - 2 VIEW COMPARISON:  PA and lateral 04/23/2021 FINDINGS: The heart size and mediastinal  contours are within normal limits. Both lungs are clear of infiltrates with scattered linear scar-like opacities again in both bases. On the PA view there is a questionable 1 cm irregular nodule superimposing along the horizontal fissure either in the base of the right upper lobe or the superior segment of the right lower lobe, not localized on the lateral view. Follow-up  CT is recommended preferably with contrast. The visualized skeletal structures are unremarkable apart from slight thoracic dextroscoliosis. IMPRESSION: 1. No evidence of acute chest disease, with scattered linear scar-like opacities. 2. Right mid lung irregular nodule or confluence of scar-like markings on the PA view only. Chest CT recommended for follow-up evaluation. Electronically Signed   By: Almira Bar M.D.   On: 09/24/2021 20:31   NM Pulmonary Perfusion  Result Date: 09/25/2021 CLINICAL DATA:  Shortness of breath since Saturday. Intermittent cough. EXAM: NUCLEAR MEDICINE PERFUSION LUNG SCAN TECHNIQUE: Perfusion images were obtained in multiple projections after intravenous injection of radiopharmaceutical. Ventilation scans intentionally deferred if perfusion scan and chest x-ray adequate for interpretation during COVID 19 epidemic. RADIOPHARMACEUTICALS:  3.9 mCi Tc-42m MAA IV COMPARISON:  Chest x-ray 09/24/2021 FINDINGS: Largely linear zone of decreased radiopharmaceutical in the right lung along with a small basilar subsegmental defect. These appear to correlate with areas of atelectasis on the recent chest x-ray. The remainder of the lungs demonstrate normal perfusion. No findings suspicious for pulmonary embolism. IMPRESSION: Areas of atelectasis in the right lung with loss of volume correlating with the perfusion abnormalities. No findings suspicious for pulmonary embolism. Electronically Signed   By: Rudie Meyer M.D.   On: 09/25/2021 13:55   US Abdomen Complete  Result Date: 09/26/2021 CLINICAL DATA:  Acute renal insufficiency. EXAM: ABDOMEN ULTRASOUND COMPLETE COMPARISON:  None. FINDINGS: Gallbladder: The gallbladder is not visualized. Common bile duct: Diameter: 4 mm Liver: No focal lesion identified. Within normal limits in parenchymal echogenicity. Portal vein is patent on color Doppler imaging with normal direction of blood flow towards the liver. IVC: No abnormality visualized.  Pancreas: Visualized portion unremarkable. Spleen: Size and appearance within normal limits. Right Kidney: Length: 12.9 cm. Echogenicity within normal limits. No mass or hydronephrosis visualized. Left Kidney: Length: 10.9 cm. Normal echogenicity. Mild fullness of the left renal pelvis. No shadowing stone. Abdominal aorta: No aneurysm visualized. Other findings: None. IMPRESSION: 1. Mild fullness of the left renal collecting system, otherwise unremarkable abdominal ultrasound. 2. Nonvisualization of the gallbladder. Electronically Signed   By: Elgie Collard M.D.   On: 09/26/2021 01:27   ECHOCARDIOGRAM COMPLETE  Result Date: 09/26/2021    ECHOCARDIOGRAM REPORT   Patient Name:   Denise Santana Date of Exam: 09/26/2021 Medical Rec #:  [id number]    Height:       66.0 in Accession #:    [id number]   Weight:       276.0 lb Date of Birth:  1981-05-01    BSA:          2.293 m Patient Age:    40 years     BP:           97/63 mmHg Patient Gender: F            HR:           63 bpm. Exam Location:  Inpatient Procedure: 2D Echo, Cardiac Doppler and Color Doppler Indications:    R07.9* Chest pain, unspecified  History:        Patient has no prior history of Echocardiogram examinations.  Sonographer:  Bernadene Person RDCS Referring Phys: [id number] BELKYS A REGALADO IMPRESSIONS  1. Left ventricular ejection fraction, by estimation, is 50 to 55%. The left ventricle has low normal function. The left ventricle has no regional wall motion abnormalities. The left ventricular internal cavity size was mildly dilated. Left ventricular diastolic parameters were normal.  2. Right ventricular systolic function is normal. The right ventricular size is normal. There is normal pulmonary artery systolic pressure.  3. The mitral valve is normal in structure. Trivial mitral valve regurgitation. No evidence of mitral stenosis.  4. The aortic valve is normal in structure. Aortic valve regurgitation is not visualized. No aortic stenosis is present.   5. The inferior vena cava is normal in size with greater than 50% respiratory variability, suggesting right atrial pressure of 3 mmHg. FINDINGS  Left Ventricle: Left ventricular ejection fraction, by estimation, is 50 to 55%. The left ventricle has low normal function. The left ventricle has no regional wall motion abnormalities. The left ventricular internal cavity size was mildly dilated. There is no left ventricular hypertrophy. Left ventricular diastolic parameters were normal. Normal left ventricular filling pressure. Right Ventricle: The right ventricular size is normal. No increase in right ventricular wall thickness. Right ventricular systolic function is normal. There is normal pulmonary artery systolic pressure. The tricuspid regurgitant velocity is 1.80 m/s, and  with an assumed right atrial pressure of 3 mmHg, the estimated right ventricular systolic pressure is [id number] mmHg. Left Atrium: Left atrial size was normal in size. Right Atrium: Right atrial size was normal in size. Pericardium: There is no evidence of pericardial effusion. Mitral Valve: The mitral valve is normal in structure. Trivial mitral valve regurgitation. No evidence of mitral valve stenosis. Tricuspid Valve: The tricuspid valve is normal in structure. Tricuspid valve regurgitation is trivial. No evidence of tricuspid stenosis. Aortic Valve: The aortic valve is normal in structure. Aortic valve regurgitation is not visualized. No aortic stenosis is present. Pulmonic Valve: The pulmonic valve was normal in structure. Pulmonic valve regurgitation is mild. No evidence of pulmonic stenosis. Aorta: The aortic root is normal in size and structure. Venous: The inferior vena cava is normal in size with greater than 50% respiratory variability, suggesting right atrial pressure of 3 mmHg. IAS/Shunts: No atrial level shunt detected by color flow Doppler.  LEFT VENTRICLE PLAX 2D LVIDd:         5.80 cm      Diastology LVIDs:         3.80 cm      LV e'  medial:    13.50 cm/s LV PW:         1.10 cm      LV E/e' medial:  4.6 LV IVS:        1.10 cm      LV e' lateral:   16.60 cm/s LVOT diam:     2.10 cm      LV E/e' lateral: 3.8 LV SV:         85 LV SV Index:   37 LVOT Area:     3.46 cm  LV Volumes (MOD) LV vol d, MOD A2C: 126.0 ml LV vol d, MOD A4C: 139.0 ml LV vol s, MOD A2C: 62.4 ml LV vol s, MOD A4C: 65.2 ml LV SV MOD A2C:     63.6 ml LV SV MOD A4C:     139.0 ml LV SV MOD BP:      67.1 ml RIGHT VENTRICLE RV S prime:     15.30 cm/s  TAPSE (M-mode): 2.1 cm LEFT ATRIUM             Index        RIGHT ATRIUM           Index LA diam:        3.10 cm 1.35 cm/m   RA Area:     17.00 cm LA Vol (A2C):   55.8 ml 24.33 ml/m  RA Volume:   43.70 ml  19.06 ml/m LA Vol (A4C):   55.0 ml 23.98 ml/m LA Biplane Vol: 57.8 ml 25.21 ml/m  AORTIC VALVE             PULMONIC VALVE LVOT Vmax:   116.00 cm/s PR End Diast Vel: 4.16 msec LVOT Vmean:  72.500 cm/s LVOT VTI:    0.246 m  AORTA Ao Root diam: 3.10 cm Ao Asc diam:  3.10 cm MITRAL VALVE               TRICUSPID VALVE MV Area (PHT): 1.91 cm    TR Peak grad:   13.0 mmHg MV Decel Time: 398 msec    TR Vmax:        180.00 cm/s MV E velocity: 62.50 cm/s MV A velocity: 29.80 cm/s  SHUNTS MV E/A ratio:  2.10        Systemic VTI:  0.25 m                            Systemic Diam: 2.10 cm Fransico Him MD Electronically signed by Fransico Him MD Signature Date/Time: 09/26/2021/10:56:40 AM    Final    VAS [place] LOWER EXTREMITY VENOUS (DVT)  Result Date: 09/26/2021  Lower Venous DVT Study Patient Name:  Denise Santana  Date of Exam:   09/26/2021 Medical Rec #: [id number]     Accession #:    [id number] Date of Birth: 1981-03-24     Patient Gender: F Patient Age:   22 years Exam Location:  [facility] Procedure:      VAS [place] LOWER EXTREMITY VENOUS (DVT) Referring Phys: Jerald Kief REGALADO --------------------------------------------------------------------------------  Indications: Edema.  Limitations: Body habitus and poor ultrasound/tissue  interface. Comparison Study: 04/23/2021- right lower extremity venous duplex- age                   indeterminate DVT right PTV and right peroneal veins Performing Technologist: Maudry Mayhew MHA, RDMS, RVT, RDCS  Examination Guidelines: A complete evaluation includes B-mode imaging, spectral Doppler, color Doppler, and power Doppler as needed of all accessible portions of each vessel. Bilateral testing is considered an integral part of a complete examination. Limited examinations for reoccurring indications may be performed as noted. The reflux portion of the exam is performed with the patient in reverse Trendelenburg.  +---------+---------------+---------+-----------+----------+--------------+  RIGHT     Compressibility Phasicity Spontaneity Properties Thrombus Aging  +---------+---------------+---------+-----------+----------+--------------+  CFV       Full            Yes       Yes                                    +---------+---------------+---------+-----------+----------+--------------+  SFJ       Full                                                             +---------+---------------+---------+-----------+----------+--------------+  FV Prox   Full                                                             +---------+---------------+---------+-----------+----------+--------------+  FV Mid    Full                                                             +---------+---------------+---------+-----------+----------+--------------+  FV Distal Full                                                             +---------+---------------+---------+-----------+----------+--------------+  PFV       Full                                                             +---------+---------------+---------+-----------+----------+--------------+  POP       Full            Yes       Yes                                    +---------+---------------+---------+-----------+----------+--------------+  PTV       Full                       Yes                                    +---------+---------------+---------+-----------+----------+--------------+  PERO      Full                      Yes                                    +---------+---------------+---------+-----------+----------+--------------+   +----+---------------+---------+-----------+----------+--------------+  LEFT Compressibility Phasicity Spontaneity Properties Thrombus Aging  +----+---------------+---------+-----------+----------+--------------+  CFV  Full            Yes       Yes                                    +----+---------------+---------+-----------+----------+--------------+     Summary: RIGHT: - Findings appear improved from previous examination. - There is no evidence of deep vein thrombosis in the lower extremity. However, portions of this examination were limited- see technologist comments above.  - No cystic structure found in the popliteal fossa.  LEFT: - No evidence of common femoral vein obstruction.  *See table(s) above  for measurements and observations. Electronically signed by Monica Martinez MD on 09/26/2021 at 4:40:55 PM.    Final       Subjective: Patient seen and examined at bedside.  Feels okay to go home today.  Denies any current nausea or vomiting.  Denies worsening shortness of breath, chest pain, fever.  Discharge Exam: Vitals:   09/27/21 0352 09/27/21 0752  BP: 95/64 108/70  Pulse: 70 60  Resp: 17 18  Temp: 98.2 F (36.8 C) (!) 97.2 F (36.2 C)  SpO2: 99% 100%    General: Pt is alert, awake, not in acute distress.  Currently on room air. Cardiovascular: rate controlled, S1/S2 + Respiratory: bilateral decreased breath sounds at bases Abdominal: Soft, morbidly obese, NT, ND, bowel sounds + Extremities: Trace lower extremity edema; no cyanosis    The results of significant diagnostics from this hospitalization (including imaging, microbiology, ancillary and laboratory) are listed below for reference.      Microbiology: Recent Results (from the past 240 hour(s))  Resp Panel by RT-PCR (Flu A&B, Covid) Nasopharyngeal Swab     Status: None   Collection Time: 09/24/21  9:15 PM   Specimen: Nasopharyngeal Swab; Nasopharyngeal(NP) swabs in vial transport medium  Result Value Ref Range Status   SARS Coronavirus 2 by RT PCR NEGATIVE NEGATIVE Final    Comment: (NOTE) SARS-CoV-2 target nucleic acids are NOT DETECTED.  The SARS-CoV-2 RNA is generally detectable in upper respiratory specimens during the acute phase of infection. The lowest concentration of SARS-CoV-2 viral copies this assay can detect is 138 copies/mL. A negative result does not preclude SARS-Cov-2 infection and should not be used as the sole basis for treatment or other patient management decisions. A negative result may occur with  improper specimen collection/handling, submission of specimen other than nasopharyngeal swab, presence of viral mutation(s) within the areas targeted by this assay, and inadequate number of viral copies(<138 copies/mL). A negative result must be combined with clinical observations, patient history, and epidemiological information. The expected result is Negative.  Fact Sheet for Patients:  EntrepreneurPulse.com.au  Fact Sheet for Healthcare Providers:  IncredibleEmployment.be  This test is no t yet approved or cleared by the [place] FDA and  has been authorized for detection and/or diagnosis of SARS-CoV-2 by FDA under an Emergency Use Authorization (EUA). This EUA will remain  in effect (meaning this test can be used) for the duration of the COVID-19 declaration under Section 564(b)(1) of the Act, 21 U.S.C.section 360bbb-3(b)(1), unless the authorization is terminated  or revoked sooner.       Influenza A by PCR NEGATIVE NEGATIVE Final   Influenza B by PCR NEGATIVE NEGATIVE Final    Comment: (NOTE) The Xpert Xpress SARS-CoV-2/FLU/RSV plus assay is  intended as an aid in the diagnosis of influenza from Nasopharyngeal swab specimens and should not be used as a sole basis for treatment. Nasal washings and aspirates are unacceptable for Xpert Xpress SARS-CoV-2/FLU/RSV testing.  Fact Sheet for Patients: EntrepreneurPulse.com.au  Fact Sheet for Healthcare Providers: IncredibleEmployment.be  This test is not yet approved or cleared by the [place] FDA and has been authorized for detection and/or diagnosis of SARS-CoV-2 by FDA under an Emergency Use Authorization (EUA). This EUA will remain in effect (meaning this test can be used) for the duration of the COVID-19 declaration under Section 564(b)(1) of the Act, 21 U.S.C. section 360bbb-3(b)(1), unless the authorization is terminated or revoked.  Performed at [facility], [place] [address] [place], [place] [address]   Urine  Culture     Status: None   Collection Time: 09/25/21  6:34 PM   Specimen: Urine, Catheterized  Result Value Ref Range Status   Specimen Description URINE, CATHETERIZED  Final   Special Requests NONE  Final   Culture   Final    NO GROWTH Performed at [facility], [address], [place], [place] [address]    Report Status 09/26/2021 FINAL  Final     Labs: BNP (last 3 results) No results for input(s): BNP in the last 8760 hours. Basic Metabolic Panel: Recent Labs  Lab 09/24/21 1956 09/25/21 1422 09/26/21 0457 09/27/21 0218  NA 138 136 142 142  K 3.5 3.1* 3.6 3.6  CL 108 100 114* 116*  CO2 18* 21* 18* 17*  GLUCOSE 97 132* 77 87  BUN 27* 41* 35* 24*  CREATININE 2.15* 3.43* 2.48* 1.90*  CALCIUM 8.9 8.9 7.6* 8.0*  MG  --   --   --  2.2   Liver Function Tests: Recent Labs  Lab 09/24/21 1956 09/26/21 0457 09/27/21 0218  AST 15 11* 9*  ALT 16 11 11   ALKPHOS 92 66 64  BILITOT 1.6* 0.9 0.5  PROT 8.2* 5.8* 5.6*  ALBUMIN 3.9 2.9* 2.8*   No results for input(s): LIPASE, AMYLASE in the last 168  hours. No results for input(s): AMMONIA in the last 168 hours. CBC: Recent Labs  Lab 09/24/21 1956 09/25/21 1422 09/26/21 0457  WBC 13.4* 12.1* 6.8  NEUTROABS 11.0* 9.3*  --   HGB 12.0 11.7* 10.0*  HCT 35.4* 33.6* 28.6*  MCV 102.0* 100.9* 100.4*  PLT 219 217 186   Cardiac Enzymes: No results for input(s): CKTOTAL, CKMB, CKMBINDEX, TROPONINI in the last 168 hours. BNP: Invalid input(s): POCBNP CBG: No results for input(s): GLUCAP in the last 168 hours. D-Dimer No results for input(s): DDIMER in the last 72 hours. Hgb A1c No results for input(s): HGBA1C in the last 72 hours. Lipid Profile No results for input(s): CHOL, HDL, LDLCALC, TRIG, CHOLHDL, LDLDIRECT in the last 72 hours. Thyroid function studies No results for input(s): TSH, T4TOTAL, T3FREE, THYROIDAB in the last 72 hours.  Invalid input(s): FREET3 Anemia work up No results for input(s): VITAMINB12, FOLATE, FERRITIN, TIBC, IRON, RETICCTPCT in the last 72 hours. Urinalysis    Component Value Date/Time   COLORURINE YELLOW 09/25/2021 1822   APPEARANCEUR CLEAR 09/25/2021 1822   LABSPEC 1.025 09/25/2021 1822   PHURINE 5.5 09/25/2021 1822   GLUCOSEU NEGATIVE 09/25/2021 1822   HGBUR TRACE (A) 09/25/2021 1822   BILIRUBINUR NEGATIVE 09/25/2021 1822   KETONESUR NEGATIVE 09/25/2021 1822   PROTEINUR 30 (A) 09/25/2021 1822   NITRITE NEGATIVE 09/25/2021 1822   LEUKOCYTESUR NEGATIVE 09/25/2021 1822   Sepsis Labs Invalid input(s): PROCALCITONIN,  WBC,  LACTICIDVEN Microbiology Recent Results (from the past 240 hour(s))  Resp Panel by RT-PCR (Flu A&B, Covid) Nasopharyngeal Swab     Status: None   Collection Time: 09/24/21  9:15 PM   Specimen: Nasopharyngeal Swab; Nasopharyngeal(NP) swabs in vial transport medium  Result Value Ref Range Status   SARS Coronavirus 2 by RT PCR NEGATIVE NEGATIVE Final    Comment: (NOTE) SARS-CoV-2 target nucleic acids are NOT DETECTED.  The SARS-CoV-2 RNA is generally detectable in upper  respiratory specimens during the acute phase of infection. The lowest concentration of SARS-CoV-2 viral copies this assay can detect is 138 copies/mL. A negative result does not preclude SARS-Cov-2 infection and should not be used as the sole basis for treatment or  other patient management decisions. A negative result may occur with  improper specimen collection/handling, submission of specimen other than nasopharyngeal swab, presence of viral mutation(s) within the areas targeted by this assay, and inadequate number of viral copies(<138 copies/mL). A negative result must be combined with clinical observations, patient history, and epidemiological information. The expected result is Negative.  Fact Sheet for Patients:  EntrepreneurPulse.com.au  Fact Sheet for Healthcare Providers:  IncredibleEmployment.be  This test is no t yet approved or cleared by the [place] FDA and  has been authorized for detection and/or diagnosis of SARS-CoV-2 by FDA under an Emergency Use Authorization (EUA). This EUA will remain  in effect (meaning this test can be used) for the duration of the COVID-19 declaration under Section 564(b)(1) of the Act, 21 U.S.C.section 360bbb-3(b)(1), unless the authorization is terminated  or revoked sooner.       Influenza A by PCR NEGATIVE NEGATIVE Final   Influenza B by PCR NEGATIVE NEGATIVE Final    Comment: (NOTE) The Xpert Xpress SARS-CoV-2/FLU/RSV plus assay is intended as an aid in the diagnosis of influenza from Nasopharyngeal swab specimens and should not be used as a sole basis for treatment. Nasal washings and aspirates are unacceptable for Xpert Xpress SARS-CoV-2/FLU/RSV testing.  Fact Sheet for Patients: EntrepreneurPulse.com.au  Fact Sheet for Healthcare Providers: IncredibleEmployment.be  This test is not yet approved or cleared by the [place] FDA and has been  authorized for detection and/or diagnosis of SARS-CoV-2 by FDA under an Emergency Use Authorization (EUA). This EUA will remain in effect (meaning this test can be used) for the duration of the COVID-19 declaration under Section 564(b)(1) of the Act, 21 U.S.C. section 360bbb-3(b)(1), unless the authorization is terminated or revoked.  Performed at [facility], [place] [address] [place], [place] [address]   Urine Culture     Status: None   Collection Time: 09/25/21  6:34 PM   Specimen: Urine, Catheterized  Result Value Ref Range Status   Specimen Description URINE, CATHETERIZED  Final   Special Requests NONE  Final   Culture   Final    NO GROWTH Performed at [facility], [address] [place], [place] [address]    Report Status 09/26/2021 FINAL  Final     Time coordinating discharge: 35 minutes  SIGNED:   Aline August, MD  [facility] 09/27/2021, 9:48 AM

## 2021-09-27 NOTE — Progress Notes (Signed)
Discharge instructions (including medications) discussed with and copy provided to patient/caregiver 

## 2021-09-27 NOTE — Plan of Care (Signed)
  Problem: Education: Goal: Knowledge of General Education information will improve Description: Including pain rating scale, medication(s)/side effects and non-pharmacologic comfort measures Outcome: Adequate for Discharge   

## 2024-03-28 ENCOUNTER — Encounter (HOSPITAL_COMMUNITY): Payer: Self-pay | Admitting: *Deleted

## 2024-03-28 ENCOUNTER — Other Ambulatory Visit: Payer: Self-pay

## 2024-03-28 ENCOUNTER — Inpatient Hospital Stay (HOSPITAL_COMMUNITY)
Admission: EM | Admit: 2024-03-28 | Discharge: 2024-03-30 | DRG: 176 | Disposition: A | Discharge status: Home / Self Care | Attending: Family Medicine | Admitting: Family Medicine

## 2024-03-28 DIAGNOSIS — Z91148 Patient's other noncompliance with medication regimen for other reason: Secondary | ICD-10-CM

## 2024-03-28 DIAGNOSIS — I82409 Acute embolism and thrombosis of unspecified deep veins of unspecified lower extremity: Secondary | ICD-10-CM | POA: Insufficient documentation

## 2024-03-28 DIAGNOSIS — I82453 Acute embolism and thrombosis of peroneal vein, bilateral: Secondary | ICD-10-CM | POA: Diagnosis present

## 2024-03-28 DIAGNOSIS — N179 Acute kidney failure, unspecified: Secondary | ICD-10-CM | POA: Diagnosis present

## 2024-03-28 DIAGNOSIS — I2699 Other pulmonary embolism without acute cor pulmonale: Principal | ICD-10-CM | POA: Diagnosis present

## 2024-03-28 DIAGNOSIS — I8290 Acute embolism and thrombosis of unspecified vein: Principal | ICD-10-CM

## 2024-03-28 DIAGNOSIS — I82432 Acute embolism and thrombosis of left popliteal vein: Secondary | ICD-10-CM | POA: Diagnosis present

## 2024-03-28 DIAGNOSIS — I82531 Chronic embolism and thrombosis of right popliteal vein: Secondary | ICD-10-CM | POA: Diagnosis present

## 2024-03-28 DIAGNOSIS — Z86711 Personal history of pulmonary embolism: Secondary | ICD-10-CM

## 2024-03-28 DIAGNOSIS — Z6841 Body Mass Index (BMI) 40.0 and over, adult: Secondary | ICD-10-CM

## 2024-03-28 DIAGNOSIS — I82443 Acute embolism and thrombosis of tibial vein, bilateral: Secondary | ICD-10-CM | POA: Diagnosis present

## 2024-03-28 DIAGNOSIS — I82462 Acute embolism and thrombosis of left calf muscular vein: Secondary | ICD-10-CM | POA: Diagnosis present

## 2024-03-28 DIAGNOSIS — E66813 Obesity, class 3: Secondary | ICD-10-CM | POA: Diagnosis present

## 2024-03-28 DIAGNOSIS — Z7901 Long term (current) use of anticoagulants: Secondary | ICD-10-CM

## 2024-03-28 DIAGNOSIS — F17210 Nicotine dependence, cigarettes, uncomplicated: Secondary | ICD-10-CM | POA: Diagnosis present

## 2024-03-28 DIAGNOSIS — E876 Hypokalemia: Secondary | ICD-10-CM | POA: Diagnosis not present

## 2024-03-28 LAB — HCG, SERUM, QUALITATIVE: Preg, Serum: NEGATIVE

## 2024-03-28 LAB — CBC
HCT: 35.7 % — ABNORMAL LOW (ref 36.0–46.0)
Hemoglobin: 12.6 g/dL (ref 12.0–15.0)
MCH: 36 pg — ABNORMAL HIGH (ref 26.0–34.0)
MCHC: 35.3 g/dL (ref 30.0–36.0)
MCV: 102 fL — ABNORMAL HIGH (ref 80.0–100.0)
Platelets: 106 K/uL — ABNORMAL LOW (ref 150–400)
RBC: 3.5 MIL/uL — ABNORMAL LOW (ref 3.87–5.11)
RDW: 13.8 % (ref 11.5–15.5)
WBC: 7.9 K/uL (ref 4.0–10.5)
nRBC: 0 % (ref 0.0–0.2)

## 2024-03-28 LAB — COMPREHENSIVE METABOLIC PANEL WITH GFR
ALT: 14 U/L (ref 0–44)
AST: 15 U/L (ref 15–41)
Albumin: 3.6 g/dL (ref 3.5–5.0)
Alkaline Phosphatase: 91 U/L (ref 38–126)
Anion gap: 10 (ref 5–15)
BUN: 13 mg/dL (ref 6–20)
CO2: 23 mmol/L (ref 22–32)
Calcium: 8.8 mg/dL — ABNORMAL LOW (ref 8.9–10.3)
Chloride: 105 mmol/L (ref 98–111)
Creatinine, Ser: 1.46 mg/dL — ABNORMAL HIGH (ref 0.44–1.00)
GFR, Estimated: 46 mL/min — ABNORMAL LOW (ref >60)
Glucose, Bld: 107 mg/dL — ABNORMAL HIGH (ref 70–99)
Potassium: 3.5 mmol/L (ref 3.5–5.1)
Sodium: 138 mmol/L (ref 135–145)
Total Bilirubin: 1.3 mg/dL — ABNORMAL HIGH (ref 0.0–1.2)
Total Protein: 6.3 g/dL — ABNORMAL LOW (ref 6.5–8.1)

## 2024-03-28 NOTE — ED Triage Notes (Signed)
 The pt reports that she has had clots in both her legs since 2018  she is supposed to be on blood thinners but she cannot afford them  she does not know how long its been without the  med  she reports that she has had pain in both her legs since 2018

## 2024-03-29 ENCOUNTER — Other Ambulatory Visit (HOSPITAL_COMMUNITY): Payer: Self-pay

## 2024-03-29 ENCOUNTER — Emergency Department (HOSPITAL_COMMUNITY)

## 2024-03-29 ENCOUNTER — Telehealth (HOSPITAL_COMMUNITY): Payer: Self-pay | Admitting: Pharmacy Technician

## 2024-03-29 DIAGNOSIS — N179 Acute kidney failure, unspecified: Secondary | ICD-10-CM | POA: Diagnosis present

## 2024-03-29 DIAGNOSIS — I82443 Acute embolism and thrombosis of tibial vein, bilateral: Secondary | ICD-10-CM | POA: Diagnosis present

## 2024-03-29 DIAGNOSIS — I2694 Multiple subsegmental pulmonary emboli without acute cor pulmonale: Secondary | ICD-10-CM | POA: Diagnosis not present

## 2024-03-29 DIAGNOSIS — I82433 Acute embolism and thrombosis of popliteal vein, bilateral: Secondary | ICD-10-CM | POA: Diagnosis not present

## 2024-03-29 DIAGNOSIS — E876 Hypokalemia: Secondary | ICD-10-CM | POA: Diagnosis not present

## 2024-03-29 DIAGNOSIS — I2699 Other pulmonary embolism without acute cor pulmonale: Secondary | ICD-10-CM | POA: Diagnosis present

## 2024-03-29 DIAGNOSIS — I82462 Acute embolism and thrombosis of left calf muscular vein: Secondary | ICD-10-CM | POA: Diagnosis present

## 2024-03-29 DIAGNOSIS — Z7901 Long term (current) use of anticoagulants: Secondary | ICD-10-CM | POA: Diagnosis not present

## 2024-03-29 DIAGNOSIS — I82409 Acute embolism and thrombosis of unspecified deep veins of unspecified lower extremity: Secondary | ICD-10-CM | POA: Insufficient documentation

## 2024-03-29 DIAGNOSIS — Z6841 Body Mass Index (BMI) 40.0 and over, adult: Secondary | ICD-10-CM | POA: Diagnosis not present

## 2024-03-29 DIAGNOSIS — Z91148 Patient's other noncompliance with medication regimen for other reason: Secondary | ICD-10-CM | POA: Diagnosis not present

## 2024-03-29 DIAGNOSIS — F17210 Nicotine dependence, cigarettes, uncomplicated: Secondary | ICD-10-CM | POA: Diagnosis present

## 2024-03-29 DIAGNOSIS — M7989 Other specified soft tissue disorders: Secondary | ICD-10-CM | POA: Diagnosis not present

## 2024-03-29 DIAGNOSIS — I82432 Acute embolism and thrombosis of left popliteal vein: Secondary | ICD-10-CM | POA: Diagnosis present

## 2024-03-29 DIAGNOSIS — I82531 Chronic embolism and thrombosis of right popliteal vein: Secondary | ICD-10-CM | POA: Diagnosis present

## 2024-03-29 DIAGNOSIS — Z86711 Personal history of pulmonary embolism: Secondary | ICD-10-CM | POA: Diagnosis not present

## 2024-03-29 DIAGNOSIS — I82453 Acute embolism and thrombosis of peroneal vein, bilateral: Secondary | ICD-10-CM | POA: Diagnosis present

## 2024-03-29 DIAGNOSIS — E66813 Obesity, class 3: Secondary | ICD-10-CM | POA: Diagnosis present

## 2024-03-29 LAB — HEMOGLOBIN A1C
Hgb A1c MFr Bld: 5 % (ref 4.8–5.6)
Mean Plasma Glucose: 96.8 mg/dL

## 2024-03-29 LAB — TROPONIN I (HIGH SENSITIVITY)
Troponin I (High Sensitivity): 54 ng/L — ABNORMAL HIGH (ref <18)
Troponin I (High Sensitivity): 40 ng/L — ABNORMAL HIGH (ref <18)

## 2024-03-29 LAB — HEPARIN LEVEL (UNFRACTIONATED): Heparin Unfractionated: 1.04 [IU]/mL — ABNORMAL HIGH (ref 0.30–0.70)

## 2024-03-29 LAB — MRSA NEXT GEN BY PCR, NASAL: MRSA by PCR Next Gen: DETECTED — AB

## 2024-03-29 LAB — APTT: aPTT: 87 s — ABNORMAL HIGH (ref 24–36)

## 2024-03-29 LAB — HIV ANTIBODY (ROUTINE TESTING W REFLEX): HIV Screen 4th Generation wRfx: NONREACTIVE

## 2024-03-29 MED ORDER — ACETAMINOPHEN 650 MG RE SUPP: 650.0000 mg | Freq: Four times a day (QID) | RECTAL | Status: DC

## 2024-03-29 MED ORDER — HYDROCODONE-ACETAMINOPHEN 5-325 MG PO TABS
1.0000 | ORAL_TABLET | Freq: Once | ORAL | Status: AC
Start: 2024-03-29 — End: 2024-03-29
  Administered 2024-03-29: 1 via ORAL
  Filled 2024-03-29: qty 1

## 2024-03-29 MED ORDER — HEPARIN BOLUS VIA INFUSION
4000.0000 [IU] | Freq: Once | INTRAVENOUS | Status: AC
  Administered 2024-03-29: 4000 [IU] via INTRAVENOUS
  Filled 2024-03-29: qty 4000

## 2024-03-29 MED ORDER — HYDROCODONE-ACETAMINOPHEN 5-325 MG PO TABS
1.0000 | ORAL_TABLET | Freq: Once | ORAL | Status: AC
  Administered 2024-03-29: 1 via ORAL
  Filled 2024-03-29: qty 1

## 2024-03-29 MED ORDER — LACTATED RINGERS IV BOLUS: 500.0000 mL | Freq: Once | INTRAVENOUS | Status: DC

## 2024-03-29 MED ORDER — LACTATED RINGERS IV SOLN: INTRAVENOUS | Status: DC

## 2024-03-29 MED ORDER — ONDANSETRON HCL 4 MG PO TABS: 4.0000 mg | ORAL_TABLET | Freq: Four times a day (QID) | ORAL | Status: DC | PRN

## 2024-03-29 MED ORDER — OXYCODONE HCL 5 MG PO TABS
5.0000 mg | ORAL_TABLET | Freq: Four times a day (QID) | ORAL | Status: DC | PRN
  Administered 2024-03-29: 5 mg via ORAL
  Filled 2024-03-29: qty 1

## 2024-03-29 MED ORDER — RIVAROXABAN 15 MG PO TABS
15.0000 mg | ORAL_TABLET | Freq: Once | ORAL | Status: AC
  Administered 2024-03-29: 15 mg via ORAL
  Filled 2024-03-29: qty 1

## 2024-03-29 MED ORDER — HEPARIN BOLUS VIA INFUSION
6000.0000 [IU] | Freq: Once | INTRAVENOUS | Status: DC
  Filled 2024-03-29: qty 6000

## 2024-03-29 MED ORDER — IOHEXOL 350 MG/ML SOLN
75.0000 mL | Freq: Once | INTRAVENOUS | Status: AC | PRN
  Administered 2024-03-29: 75 mL via INTRAVENOUS

## 2024-03-29 MED ORDER — HEPARIN SODIUM (PORCINE) 5000 UNIT/ML IJ SOLN: 4000.0000 [IU] | Freq: Once | INTRAMUSCULAR | Status: DC

## 2024-03-29 MED ORDER — ONDANSETRON HCL 4 MG/2ML IJ SOLN
4.0000 mg | Freq: Four times a day (QID) | INTRAMUSCULAR | Status: DC | PRN
  Administered 2024-03-29: 4 mg via INTRAVENOUS
  Filled 2024-03-29: qty 2

## 2024-03-29 MED ORDER — SODIUM CHLORIDE 0.9% FLUSH
3.0000 mL | Freq: Two times a day (BID) | INTRAVENOUS | Status: DC
  Administered 2024-03-29 – 2024-03-30 (×2): 3 mL via INTRAVENOUS

## 2024-03-29 MED ORDER — OXYCODONE HCL 5 MG PO TABS
10.0000 mg | ORAL_TABLET | Freq: Once | ORAL | Status: AC
  Administered 2024-03-29: 10 mg via ORAL
  Filled 2024-03-29: qty 2

## 2024-03-29 MED ORDER — ACETAMINOPHEN 325 MG PO TABS
650.0000 mg | ORAL_TABLET | Freq: Four times a day (QID) | ORAL | Status: DC
  Administered 2024-03-29 – 2024-03-30 (×2): 650 mg via ORAL
  Filled 2024-03-29 (×3): qty 2

## 2024-03-29 MED ORDER — CHLORHEXIDINE GLUCONATE CLOTH 2 % EX PADS
6.0000 | MEDICATED_PAD | Freq: Every day | CUTANEOUS | Status: DC
  Administered 2024-03-30: 6 via TOPICAL

## 2024-03-29 MED ORDER — HEPARIN (PORCINE) 25000 UT/250ML-% IV SOLN
1500.0000 [IU]/h | INTRAVENOUS | Status: DC
  Administered 2024-03-29 – 2024-03-30 (×2): 1500 [IU]/h via INTRAVENOUS
  Filled 2024-03-29 (×2): qty 250

## 2024-03-29 NOTE — ED Notes (Signed)
Pt called x2 for vitals recheck. Pt could not be found.

## 2024-03-29 NOTE — Assessment & Plan Note (Addendum)
 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 - Admit to FMTS for anticoagulation therapy, Med Tele unit, attending Dr. McDiarmid - Continue heparin  gtt - Pain regimen: Acetaminophen  650 mg Q6h SCH, Oxycodone  5 mg Q6h PRN - Continuous cardiac monitoring - EKG, troponins ordered to rule out ACS - Inpatient pharmacy assistance for medication affordability. - Plan for outpatient Heme referral - AM CBC

## 2024-03-29 NOTE — Hospital Course (Addendum)
 Denise Santana is a 43 y.o. year old with a history of DVTs and PEs who presented with lower extremity pain and shortness of breath and was admitted to the [facility] Medicine Teaching Service for DVT and PE.  Pulmonary Embolism Patient with known history of PEs presenting with dyspnea; CT Angio chest showed bilateral pulmonary emboli. Patient treated with heparin  drip; transitioned to Eliquis .  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 Patient treated with heparin  drip; transitioned to Eliquis .  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 One episode of vomiting while inpatient, no episodes of diarrhea, patient was treated with IV fluids (see AKI for details).  AKI Cr on admission of 1.46; unknown baseline. Suspected AKI in setting of dehydration given recent vomiting/diarrhea. Given LR bolus of 400 mL and then maintenance fluids with LR 100 mL/hr. Cr improved to 1.21 at time of discharge.   PCP Follow-up Recommendations: Age and sex appropriate malignancy screening Outpatient hematology referral for hypercoagulability workup

## 2024-03-29 NOTE — Progress Notes (Signed)
 PHARMACY - ANTICOAGULATION CONSULT NOTE  Pharmacy Consult for heparin  Indication: pulmonary embolus  No Known Allergies  Patient Measurements: Height: 5' 7 (170.2 cm) Weight: 124.2 kg (273 lb 13 oz) IBW/kg (Calculated) : 61.6 HEPARIN  DW (KG): 89.1  Vital Signs: Temp: 97.9 F (36.6 C) (07/14 2035) Temp Source: Oral (07/14 1745) BP: 107/51 (07/14 2035) Pulse Rate: 91 (07/14 2035)  Labs: Recent Labs    03/28/24 2256 03/29/24 1812 03/29/24 2048  HGB 12.6  --   --   HCT 35.7*  --   --   PLT 106*  --   --   APTT  --   --  87*  HEPARINUNFRC  --   --  1.04*  CREATININE 1.46*  --   --   TROPONINIHS  --  54*  --     Estimated Creatinine Clearance: 68.6 mL/min (A) (by C-G formula based on SCr of 1.46 mg/dL (H)).   Medical History: Past Medical History:  Diagnosis Date   DVT (deep venous thrombosis) (HCC)      Assessment: 41 yoF found to have pulmonary embolism. Patient was previously prescribed Xarelto , but the patient stated she does not have insurance and could not afford the medication. The patient advocate team ran a benefits check and she does have prescription coverage the copay is $4 for either Xarelto  or Eliquis . CBC WNL. Pharmacy consulted to manage heparin  infusion. Noted one Xarelto  15mg  tablet was given this morning (7/14 ~0400).  HL - 1.04 supratherapeutic (likely elevated in setting of recent DOAC) aPTT - 87 therapeutic   Goal of Therapy:  Heparin  level 0.3-0.7 units/ml aPTT 66-102 seconds Monitor platelets by anticoagulation protocol: Yes   Plan:  Continue heparin  infusion at 1500 units/hr F/u anti-Xa and aPTT level in 6 hours as confirmatory level with AM labs and daily while on heparin  Continue to monitor H&H and platelets  Sharyne Glatter, PharmD, BCCCP Clinical Pharmacist 03/29/2024 9:55 PM

## 2024-03-29 NOTE — Plan of Care (Addendum)
 Paged for admission by Dorn Dec, PA.  Went to ED to evaluate patient, unable to find her.  Discussed with bedside RN, who reported patient was last seen 30 minutes prior in Hallway 22 bed, then her bed was reassigned to ED 34, but she was not found for transfer.  Spoke with Dorn Dec, PA, together checked waiting room and nearby area and unable to find patient.  Suspect she eloped.  Appreciate ED assistance, available to admit patient if she turns up.   Antwonette Feliz Toma, MD PGY-2 Family Medicine Teaching Service

## 2024-03-29 NOTE — Progress Notes (Addendum)
 PHARMACY - ANTICOAGULATION CONSULT NOTE  Pharmacy Consult for heparin  Indication: pulmonary embolus  No Known Allergies  Patient Measurements: Height: 5' 6 (167.6 cm) Weight: 124.2 kg (273 lb 13 oz) IBW/kg (Calculated) : 59.3 HEPARIN  DW (KG): 89.1  Vital Signs: Temp: 98.7 F (37.1 C) (07/14 0641) Temp Source: Oral (07/14 0641) BP: 105/74 (07/14 0641) Pulse Rate: 73 (07/14 0641)  Labs: Recent Labs    03/28/24 2256  HGB 12.6  HCT 35.7*  PLT 106*  CREATININE 1.46*    Estimated Creatinine Clearance: 67.6 mL/min (A) (by C-G formula based on SCr of 1.46 mg/dL (H)).   Medical History: Past Medical History:  Diagnosis Date   DVT (deep venous thrombosis) (HCC)      Assessment: 36 yoF found to have pulmonary embolism. Patient was previously prescribed Xarelto , but the patient stated she does not have insurance and could not afford the medication. The patient advocate team ran a benefits check and she does have prescription coverage the copay is $4 for either Xarelto  or Eliquis . CBC WNL. Pharmacy consulted to manage heparin  infusion. Noted one Xarelto  15mg  tablet was given this morning (7/14 ~0400).  Goal of Therapy:  Heparin  level 0.3-0.7 units/ml aPTT 66-102 seconds Monitor platelets by anticoagulation protocol: Yes   Plan:  Give 4000 units bolus x 1 Start heparin  infusion at 1500 units/hr Check anti-Xa level in 6 hours and daily while on heparin  Continue to monitor H&H and platelets  Denise Santana 03/29/2024,12:52 PM

## 2024-03-29 NOTE — ED Provider Notes (Signed)
  Physical Exam  BP 105/74 (BP Location: Left Wrist)   Pulse 73   Temp 98.7 F (37.1 C) (Oral)   Resp 18   Ht 5' 6 (1.676 m)   Wt 124.2 kg   LMP 03/01/2024   SpO2 97%   BMI 44.19 kg/m   Physical Exam  Procedures  Procedures  ED Course / MDM    Medical Decision Making Amount and/or Complexity of Data Reviewed Labs: ordered. Radiology: ordered.  Risk Prescription drug management. Decision regarding hospitalization.   Medical Decision Making:   Assumed care from [facility], PA-C.  Denise Santana is a 43 y.o. female who presented to the ED today with right lower extremity pain that has been persistently worsening over the last week.  Detailed above.   Extensive workup undertaken prior to symptom of care includes basic lab work, has received single dose of Xarelto , also pain managed with oral hydrocodone .  At time of assumption of care, pending ultrasound of the right lower extremity to rule out DVT.  Based on findings of exam, disposition is to discharge on outpatient course of DOAC, though may need to consider alternatives as it was reported that the patient may have issues with affording their medications.   Reassessment and Plan:   Preliminary results from the DVT study did demonstrate acute occlusive DVT in the left posterior tibial and peroneal veins, partially occlusive acute DVT in the right popliteal vein, acute intermuscular thrombus in the left gastrocnemius vein, age Indeterminate DVT in the left posterior tibial peroneal veins, and chronic DVT in the right popliteal vein.  During the study, the ultrasonographer noted that the patient was short of breath.  Per the patient, this has been ongoing for the last month though she had felt that this was only due to her being larger and body habitus.  As a result, concern for pulmonary embolus secondary to bilateral DVTs and previous history of previous thromboembolism.  Reviewed results of CT angiography of the chest, results do  show that she has multiple significant pulmonary emboli.  Along with her new found bilateral DVTs, will begin heparin  on this patient and consult with hospitalist team for admission.   Discussed case with family medicine who accepts patient for admission.  Further this patient has started on heparin  bolus and heparin  infusion as noted.       Myriam Dorn BROCKS, PA 03/29/24 1303    Dasie Faden, MD 03/30/24 [phone]

## 2024-03-29 NOTE — H&P (Shared)
     Hospital Admission History and Physical Service Pager: [phone]  Patient name: Denise Santana Medical record number: [id number] Date of Birth: 27-Aug-1981 Age: 43 y.o. Gender: female  Primary Care Provider: Waylan Almarie SAUNDERS, MD Consultants: *** Code Status: *** which was confirmed with family if patient unable to confirm ***  Preferred Emergency Contact:  Contact Information     Name Relation Home Work Mobile   BOWERS,PATRICIA Mother   [phone]      Other Contacts   None on File      Chief Complaint: ***  Differential and Medical Decision Making Denise Santana is a 43 y.o. female presenting with *** . Differential for this patient's presentation of this includes ***, ***, and ***.  (***describe here why less likely than primary diagnosis-delete this once complete***)  Assessment & Plan    Chronic and Stable Conditions: ***  FEN/GI: *** VTE Prophylaxis: ***  Disposition: ***  History of Present Illness:  Denise Santana is a 43 y.o. female presenting with ***  Multiple prior PE, b/l DVTs. Started on heparin , perhaps vascular surgery consult.   In the ED, ***  Review Of Systems: Per HPI.   Pertinent Past Medical History: DVT  Remainder reviewed in history tab.   Pertinent Past Surgical History: Appendectomy Kidney surgery per note  Remainder reviewed in history tab.   Pertinent Social History: Tobacco use: Yes/No/Former Alcohol use: *** Other Substance use: *** Lives with ***  Pertinent Family History: Mother, alive, HTN HLD T2DM HF  Remainder reviewed in history tab.   Important Outpatient Medications: *** Remainder reviewed in medication history.   Objective: BP 105/74 (BP Location: Left Wrist)   Pulse 73   Temp 98.7 F (37.1 C) (Oral)   Resp 18   Ht 5' 6 (1.676 m)   Wt 124.2 kg   LMP 03/01/2024   SpO2 97%   BMI 44.19 kg/m  Exam: General: *** Eyes: *** ENTM: *** Neck: *** Cardiovascular: *** Respiratory:  *** Gastrointestinal: *** MSK: *** Derm: *** Neuro: *** Psych: ***  Labs:  CBC BMET  Recent Labs  Lab 03/28/24 2256  WBC 7.9  HGB 12.6  HCT 35.7*  PLT 106*   Recent Labs  Lab 03/28/24 2256  NA 138  K 3.5  CL 105  CO2 23  BUN 13  CREATININE 1.46*  GLUCOSE 107*  CALCIUM 8.8*    Pertinent additional labs ***.  EKG: My own interpretation (not copied from electronic read) ***    Imaging Studies Performed:  CTA PE My interpretation of radiologist read: Bilateral PE with some appearing possibly chronic in nature.   Manon Jester, DO 03/29/2024, 12:29 PM PGY-1, [facility] Family Medicine  FPTS Intern pager: [phone], text pages welcome Secure chat group [facility] Teaching Service

## 2024-03-29 NOTE — ED Provider Notes (Signed)
 [place] EMERGENCY DEPARTMENT AT [facility] Provider Note   CSN: [id number] Arrival date & time: 03/28/24  2234     Patient presents with: dvts   Denise Santana is a 43 y.o. female.  Patient with past medical history significant for history of DVT presents the emergency department complaining of pain in the right leg which has been worsening for the past week or more.  She states she has had chronic pain in both legs since 2018.  She used to take Eliquis  but states that she probably had her last dose 2 to 3 years ago due to affordability reasons.  She currently [place] shortness of breath, chest pain, abdominal pain, nausea, vomiting, fever.  She states that her right leg is swollen when compared to the left leg.   HPI     Prior to Admission medications   Medication Sig Start Date End Date Taking? Authorizing Provider  ondansetron  (ZOFRAN ) 4 MG tablet Take 1 tablet (4 mg total) by mouth every 6 (six) hours as needed for nausea. 09/27/21   Cheryle Page, MD  pantoprazole  (PROTONIX ) 40 MG tablet Take 1 tablet (40 mg total) by mouth daily for 15 days. 09/27/21 10/12/21  Cheryle Page, MD  rivaroxaban  (XARELTO ) 20 MG TABS tablet Take 1 tablet (20 mg total) by mouth daily with supper. 03/24/21 09/25/21  Layden, Lindsey A, PA-C    Allergies: Patient has no known allergies.    Review of Systems  Updated Vital Signs BP 124/84 (BP Location: Right Arm)   Pulse 82   Temp 98.5 F (36.9 C)   Resp 19   Ht 5' 6 (1.676 m)   Wt 124.2 kg   LMP 03/01/2024   SpO2 97%   BMI 44.19 kg/m   Physical Exam Vitals and nursing note reviewed.  Constitutional:      General: She is not in acute distress.    Appearance: She is obese.  HENT:     Head: Normocephalic and atraumatic.  Eyes:     Pupils: Pupils are equal, round, and reactive to light.  Cardiovascular:     Rate and Rhythm: Normal rate and regular rhythm.  Pulmonary:     Effort: Pulmonary effort is normal. No respiratory distress.      Breath sounds: Normal breath sounds.  Musculoskeletal:        General: No signs of injury.     Cervical back: Normal range of motion.     Comments: Right lower leg swollen compared to the contralateral extremity.  Positive Homans' sign on the right side.  Palpable pedal pulses bilaterally  Skin:    General: Skin is dry.  Neurological:     Mental Status: She is alert.  Psychiatric:        Speech: Speech normal.        Behavior: Behavior normal.     (all labs ordered are listed, but only abnormal results are displayed) Labs Reviewed  COMPREHENSIVE METABOLIC PANEL WITH GFR - Abnormal; Notable for the following components:      Result Value   Glucose, Bld 107 (*)    Creatinine, Ser 1.46 (*)    Calcium 8.8 (*)    Total Protein 6.3 (*)    Total Bilirubin 1.3 (*)    GFR, Estimated 46 (*)    All other components within normal limits  CBC - Abnormal; Notable for the following components:   RBC 3.50 (*)    HCT 35.7 (*)    MCV 102.0 (*)  MCH 36.0 (*)    Platelets 106 (*)    All other components within normal limits  HCG, SERUM, QUALITATIVE    EKG: None  Radiology: No results found.   Procedures   Medications Ordered in the ED - No data to display                                  Medical Decision Making  This patient presents to the ED for concern of right lower extremity pain and swelling, this involves an extensive number of treatment options, and is a complaint that carries with it a high risk of complications and morbidity.  The differential diagnosis includes DVT, cellulitis, fracture, dislocation, others   Co morbidities / Chronic conditions that complicate the patient evaluation  History of DVT   Additional history obtained:  Additional history obtained from EMR External records from outside source obtained and reviewed including previous notes from vascular surgery   Lab Tests:  I Ordered, and personally interpreted labs.  The pertinent results  include: Creatinine 1.46.  Unclear baseline.  Last creatinine from 2 years ago was significantly elevated when compared to this value.   Imaging Studies ordered:  I ordered imaging studies including DVT study Imaging pending   Problem List / ED Course / Critical interventions / Medication management   I ordered medication including Norco, Xarelto  Reevaluation of the patient after these medicines showed that the patient improved I have reviewed the patients home medicines and have made adjustments as needed   Social Determinants of Health:  Patient is a daily smoker, has Medicaid for her primary health insurance type.  States she has had issues with affording medication   Test / Admission - Considered:  Patient care being transferred to Dorn Dec, PA-C at shift handoff with plans for DVT study.  If positive patient will need further anticoagulation.  No injury to suggest soft tissue injury, fracture, dislocation.      Final diagnoses:  None    ED Discharge Orders     None          Denise Santana Denise Santana 03/29/24 9366    Raford Lenis, MD 03/29/24 [phone]

## 2024-03-29 NOTE — H&P (Cosign Needed Addendum)
 Hospital Admission History and Physical Service Pager: [phone]  Patient name: Denise Santana Medical record number: [id number] Date of Birth: 26-Apr-1981 Age: 43 y.o. Gender: female  Primary Care Provider: Waylan Almarie SAUNDERS, MD Consultants: None Code Status: FULL, which was confirmed with patient at bedside Preferred Emergency Contact: Mother (reportedly also currently hospitalized at [facility]) Contact Information     Name Relation Home Work Mobile   BOWERS,PATRICIA Mother   [phone]      Other Contacts   None on File    Chief Complaint: L leg pain  Assessment and Plan: Denise Santana is a 43 y.o. female presenting with left lower extremity pain and shortness of breath.  Diagnosis of PE given CTA chest findings and DVT given LE US  findings.  Also considered ACS given shortness of breath, substernal chest pain that feels like tightening, will get troponin/EKG to rule out.  Etiology of PE and DVT is unclear, patient appears to have had unprovoked DVT in 2022 and has not been compliant with Eliquis  since due to cost.  Suspect patient may have underlying hypercoagulable condition given prior DVT, history of miscarriages, and PE in sister.  Will anticoagulate and determine affordable medication regimen prior to discharge, then refer to Heme/Onc for outpatient workup. 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 - Admit to FMTS for anticoagulation therapy, Med Tele unit, attending Dr. McDiarmid - Continue heparin  gtt - Pain regimen: Acetaminophen  650 mg Q6h SCH, Oxycodone  5 mg Q6h PRN - Continuous cardiac monitoring - EKG, troponins ordered to rule out ACS - Inpatient pharmacy assistance for medication affordability. - Plan for outpatient Heme referral - AM CBC DVT (deep venous thrombosis) (HCC) Left lower extremity pain and swelling.  Patient states this is a previous pain to her prior right DVT.  DVT  ultrasound shows acute on chronic right DVT and acute left DVT. - Heparin  gtt - Pain management as above AKI (acute kidney injury) (HCC) Creatinine 1.46.  Unknown baseline, but suspect AKI 2/2 dehydration in the setting of vomiting/diarrhea. - LR bolus 500 mL x1, then mIVF LR @ 100 mL/hr - Zofran  Q6 PRN for nausea - GIPP and enteric precautions - AM BMP  FEN/GI: Regular VTE Prophylaxis: Heparin   Disposition: Home pending clinical improvement  History of Present Illness: Denise Santana is a 43 y.o. female with a pertinent PMH of prior PEs, right DVT.  Presenting with left lower extremity pain.   Patient has a history of R lower leg and lung clots in 2022 per chart review. 2 weeks ago, patient started feeling L calf pain, but was worried about coming into the hospital. Patient cannot afford Eliquis , and has not taken it since 2022. She developed shortness of breath that began 3 days ago and has been progressively worsening.  Patient also endorses chest pain that began 3 days ago.  She describes her chest pain as tightening. Patient states that her legs intermittently swell up and get painful, then resolve on their own.  She states this has been the case for few years now.  Patient does not go to the doctor when this happens, but suspects these are recurrent clots.  Of note, patient also has a history of 2 miscarriages.  Patient has also been experiencing vomiting and diarrhea starting on 7/9.  Patient reports x4 daily episodes of vomitus.  Patient has also been having constant diarrhea the past week since 7/9, reportedly anytime I eat or drink anything.  Patient states she has not really been worried about her vomiting or diarrhea as her mother has been in the hospital.  In the ED, patient is hemodynamically stable.  CT angio chest shows bilateral pulmonary emboli.  Patient started on heparin  drip.  FMTS consulted for admission for anti-coagulation.  Review Of Systems: Per HPI    Pertinent Past Medical History: - Hx DVTs and Pes - Does not access medical care often, possibly other medical conditions undiagnosed  Remainder reviewed in history tab.   Pertinent Past Surgical History: - Stent in R kidney several years ago  Remainder reviewed in history tab.  Pertinent Social History: Tobacco use: 1 pack every 1.5 weeks, smoked since age 74 Alcohol use: On special occasions Other Substance use: None Lives with 3 kids and her mother. Patient's mother is currently hospitalized here at [facility].  Pertinent Family History: - Sister died in her sleep from blood clots, no other family members - Mom has CHF - Mom and dad had MIs - Aunt had a stroke - Aunt had stomach cancer - Mom had a miscarriage  Remainder reviewed in history tab.    Important Outpatient Medications: Not taking any medications, including Eliquis  5 mg BID  Remainder reviewed in medication history.    Objective: BP (!) 107/51 (BP Location: Right Arm)   Pulse 91   Temp 97.9 F (36.6 C)   Resp 19   Ht 5' 7 (1.702 m)   Wt 124.2 kg   LMP 03/01/2024   SpO2 93%   BMI 42.88 kg/m   Physical Exam: General: Awake, alert, NAD. Eyes: Nonicteric, PERRLA. ENTM: Mucous membranes mildly dry. Cardiovascular: RRR, no m/g/r. Respiratory: CTAB, normal work of breathing on room air. Gastrointestinal: Soft, nondistended, nontender to palpation, bowel sounds normoactive. Extremities: Nonpitting lower extremity edema bilaterally R>L, 2+ dorsalis pedis pulses bilaterally with 2-3 second capillary refill distally. Derm: No rash or skin lesions noted. Neuro: No gross focal deficits. Psych: Mood and affect appropriate.  Labs:  CBC BMET  Recent Labs  Lab 03/28/24 2256  WBC 7.9  HGB 12.6  HCT 35.7*  PLT 106*   Recent Labs  Lab 03/28/24 2256  NA 138  K 3.5  CL 105  CO2 23  BUN 13  CREATININE 1.46*  GLUCOSE 107*  CALCIUM 8.8*      EKG: Ordered, pending  Imaging Studies: - CT  angio chest: Bilateral pulmonary emboli in   Bilateral lower extremity venous ultrasound: Right: - Findings consistent with acute on chronic deep vein thrombosis involving  the right posterior tibial veins, and right peroneal veins.  - Findings consistent with chronic deep vein thrombosis involving the  right popliteal vein.  - No cystic structure found in the popliteal fossa. Left: - Findings consistent with acute deep vein thrombosis involving the left  popliteal vein, left posterior tibial veins, and left peroneal veins.  Findings consistent with acute intramuscular thrombosis involving the left  gastrocnemius veins.  - No cystic structure found in the popliteal fossa.   Lennie Raguel MATSU, DO 03/29/2024, 3:26 PM PGY-1, [place] [facility]   FPTS Intern pager: [phone], text pages welcome Secure chat group [facility] Teaching Service   FMTS Upper-Level Resident Attestation I have independently interviewed and examined the patient. I have discussed the above with Dr. Lennie and agree with the documented plan. My edits for correction/addition/clarification are included above. Please see any attending notes.  Ameirah Khatoon Toma, MD PGY-2, [place] [facility] 03/29/2024 9:00 PM FPTS  Service pager: [phone] (text pages welcome through [facility])

## 2024-03-29 NOTE — Assessment & Plan Note (Addendum)
 Left lower extremity pain and swelling.  Patient states this is a previous pain to her prior right DVT.  DVT ultrasound shows acute on chronic right DVT and acute left DVT. - Heparin  gtt - Pain management as above

## 2024-03-29 NOTE — TOC Benefit Eligibility Note (Signed)
 Transition of Care [facility]) Benefit Eligibility Note    Patient Details  Name: Tonna Palazzi MRN: [id number] Date of Birth: Sep 03, 1981   Medication/Dose: xarelto  and/or eliquis   Covered?: Yes     Prescription Coverage Preferred Pharmacy: [facility] [address]  Spoke with Person/Company/Phone Number:: [employer]  Co-Pay: $4             Debarah Saunas, RN Phone Number: 03/29/2024, 1:17 PM

## 2024-03-29 NOTE — Progress Notes (Signed)
 Patient transported to 64M from the ED. The patient had a bag of clothes along with a phone charger. When transferring the patient to 2W, the patient stated that she was missing her phone, and that it was last seen on the ED stretcher. This RN and tech searched the room, and linen bag. This RN called the ED and the ED said they would talk to ED RN. Patient and 2W RN notified.

## 2024-03-29 NOTE — Assessment & Plan Note (Addendum)
 Creatinine 1.46.  Unknown baseline, but suspect AKI 2/2 dehydration in the setting of vomiting/diarrhea. - LR bolus 500 mL x1, then mIVF LR @ 100 mL/hr - Zofran  Q6 PRN for nausea - GIPP and enteric precautions - AM BMP

## 2024-03-29 NOTE — Plan of Care (Signed)
 FMTS Interim Progress Note  S: Rounded on pt overnight. She is reporting significant left leg pain; awaiting prn meds. Denies trouble breathing at rest, though reports some with movement.  Also reporting some nausea, no vomiting, no diarrhea currently. Last episode of vomiting Sunday night; last diarrhea Saturday. Symptoms began Wednesday 7/9. 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  O: BP 107/64   Pulse 77   Temp 98.3 F (36.8 C) (Oral)   Resp (!) 27   Ht 5' 7 (1.702 m)   Wt 124.2 kg   LMP 03/01/2024   SpO2 97%   BMI 42.88 kg/m   Physical Exam General: Pt lying down in bed, no acute distress. Cardiovascular: Regular rate and rhythm, no murmurs/rubs/gallops. Respiratory: Normal work of breathing on room air. Clear to auscultation bilaterally; no wheezes, crackles. Abdomen: Bowel sounds present and normoactive bilaterally. Soft, nondistended, nontender. Extremities: Skin warm, dry. None to minimal BLE edema. Pain to light palpation of left calf. No pain to palpation of right calf. Neuro: Pt alert, appropriately responding to questions.  A/P: PE Pt resting well, stable. - Heparin  drip - Pain regimen: tylenol  650 mg q6h, oxycodone  5 mg q6h prn - Continuous telemetry  DVT Left calf pain, known DVT. - Treatment as above (heparin , pain regimen)  N/V/D Suspect gastroenteritis. - Enteric precautions ordered - Pt on LR infusion at 100 mL/hr; also awaiting 500 mL bolus  Larraine Palma, MD 03/29/2024, 8:33 PM PGY-1, [facility] Family Medicine Service pager [phone]

## 2024-03-29 NOTE — ED Notes (Signed)
 Received report on patient. Pt off the floor at this time. Pt may have went to the cafeteria.SABRASABRA

## 2024-03-29 NOTE — ED Notes (Signed)
 Patient not in her bed, she did not tell anyone that she was going off the floor.

## 2024-03-29 NOTE — ED Notes (Signed)
 Pt still not located at this time.

## 2024-03-29 NOTE — ED Notes (Signed)
 Verified pt is not in US .

## 2024-03-29 NOTE — Telephone Encounter (Signed)
Patient Product/process development scientist completed.    The patient is insured through [facility] MEDICAID.     Ran test claim for Eliquis 5 mg and the current 30 day co-pay is $4.00.  Ran test claim for Xarelto 20 mg and the current 30 day co-pay is $4.00.  This test claim was processed through [facility]- copay amounts may vary at other pharmacies due to pharmacy/plan contracts, or as the patient moves through the different stages of their insurance plan.     Roland Earl, CPHT Pharmacy Technician III Certified Patient Advocate [facility] Pharm[facility] Patient Advocate Team Direct Number: [phone]  Fax: [phone]

## 2024-03-29 NOTE — Progress Notes (Signed)
 VASCULAR LAB    Bilateral lower extremity venous duplex has been performed.  See CV proc for preliminary results.  Relayed results to Dorn Dec, PA-C and Dr. Dasie via secure chat.  Jahziah Simonin, RVT 03/29/2024, 10:16 AM

## 2024-03-30 ENCOUNTER — Other Ambulatory Visit (HOSPITAL_COMMUNITY): Payer: Self-pay

## 2024-03-30 DIAGNOSIS — I2694 Multiple subsegmental pulmonary emboli without acute cor pulmonale: Secondary | ICD-10-CM | POA: Diagnosis not present

## 2024-03-30 DIAGNOSIS — I82433 Acute embolism and thrombosis of popliteal vein, bilateral: Secondary | ICD-10-CM

## 2024-03-30 DIAGNOSIS — N179 Acute kidney failure, unspecified: Secondary | ICD-10-CM | POA: Diagnosis not present

## 2024-03-30 LAB — BASIC METABOLIC PANEL WITH GFR
Anion gap: 8 (ref 5–15)
Anion gap: 9 (ref 5–15)
BUN: 10 mg/dL (ref 6–20)
BUN: 8 mg/dL (ref 6–20)
CO2: 24 mmol/L (ref 22–32)
CO2: 22 mmol/L (ref 22–32)
Calcium: 8.5 mg/dL — ABNORMAL LOW (ref 8.9–10.3)
Calcium: 8.4 mg/dL — ABNORMAL LOW (ref 8.9–10.3)
Chloride: 107 mmol/L (ref 98–111)
Chloride: 107 mmol/L (ref 98–111)
Creatinine, Ser: 1.21 mg/dL — ABNORMAL HIGH (ref 0.44–1.00)
Creatinine, Ser: 1.24 mg/dL — ABNORMAL HIGH (ref 0.44–1.00)
GFR, Estimated: 57 mL/min — ABNORMAL LOW (ref >60)
GFR, Estimated: 56 mL/min — ABNORMAL LOW (ref >60)
Glucose, Bld: 122 mg/dL — ABNORMAL HIGH (ref 70–99)
Glucose, Bld: 104 mg/dL — ABNORMAL HIGH (ref 70–99)
Potassium: 3.2 mmol/L — ABNORMAL LOW (ref 3.5–5.1)
Potassium: 3.5 mmol/L (ref 3.5–5.1)
Sodium: 139 mmol/L (ref 135–145)
Sodium: 138 mmol/L (ref 135–145)

## 2024-03-30 LAB — CBC
HCT: 32.4 % — ABNORMAL LOW (ref 36.0–46.0)
Hemoglobin: 11.2 g/dL — ABNORMAL LOW (ref 12.0–15.0)
MCH: 35.4 pg — ABNORMAL HIGH (ref 26.0–34.0)
MCHC: 34.6 g/dL (ref 30.0–36.0)
MCV: 102.5 fL — ABNORMAL HIGH (ref 80.0–100.0)
Platelets: 88 K/uL — ABNORMAL LOW (ref 150–400)
RBC: 3.16 MIL/uL — ABNORMAL LOW (ref 3.87–5.11)
RDW: 13.7 % (ref 11.5–15.5)
WBC: 5.4 K/uL (ref 4.0–10.5)
nRBC: 0 % (ref 0.0–0.2)

## 2024-03-30 LAB — LIPID PANEL
Cholesterol: 142 mg/dL (ref 0–200)
HDL: 43 mg/dL (ref >40)
LDL Cholesterol: 82 mg/dL (ref 0–99)
Total CHOL/HDL Ratio: 3.3 ratio
Triglycerides: 87 mg/dL (ref <150)
VLDL: 17 mg/dL (ref 0–40)

## 2024-03-30 LAB — HEPARIN LEVEL (UNFRACTIONATED): Heparin Unfractionated: 0.68 [IU]/mL (ref 0.30–0.70)

## 2024-03-30 LAB — FOLATE: Folate: 5 ng/mL — ABNORMAL LOW (ref >5.9)

## 2024-03-30 LAB — APTT: aPTT: 92 s — ABNORMAL HIGH (ref 24–36)

## 2024-03-30 LAB — TSH: TSH: 1.183 u[IU]/mL (ref 0.350–4.500)

## 2024-03-30 LAB — VITAMIN B12: Vitamin B-12: 96 pg/mL — ABNORMAL LOW (ref 180–914)

## 2024-03-30 MED ORDER — APIXABAN (ELIQUIS) VTE STARTER PACK (10MG AND 5MG)
ORAL_TABLET | ORAL | 0 refills | Status: DC
  Filled 2024-03-30: qty 74, 30d supply, fill #0

## 2024-03-30 MED ORDER — OXYCODONE HCL 5 MG PO TABS
5.0000 mg | ORAL_TABLET | Freq: Four times a day (QID) | ORAL | Status: DC
  Administered 2024-03-30: 5 mg via ORAL
  Filled 2024-03-30: qty 1

## 2024-03-30 MED ORDER — APIXABAN 5 MG PO TABS
10.0000 mg | ORAL_TABLET | Freq: Two times a day (BID) | ORAL | Status: DC
  Administered 2024-03-30: 10 mg via ORAL
  Filled 2024-03-30: qty 2

## 2024-03-30 MED ORDER — POTASSIUM CHLORIDE CRYS ER 20 MEQ PO TBCR
40.0000 meq | EXTENDED_RELEASE_TABLET | Freq: Once | ORAL | Status: AC
  Administered 2024-03-30: 40 meq via ORAL
  Filled 2024-03-30: qty 2

## 2024-03-30 MED ORDER — APIXABAN 5 MG PO TABS: 5.0000 mg | ORAL_TABLET | Freq: Two times a day (BID) | ORAL | Status: DC

## 2024-03-30 MED ORDER — ONDANSETRON 4 MG PO TBDP
4.0000 mg | ORAL_TABLET | Freq: Three times a day (TID) | ORAL | 0 refills | Status: DC | PRN
  Filled 2024-03-30: qty 20, 7d supply, fill #0

## 2024-03-30 MED ORDER — ACETAMINOPHEN 325 MG PO TABS: 650.0000 mg | ORAL_TABLET | Freq: Four times a day (QID) | ORAL | Status: AC

## 2024-03-30 MED ORDER — MUPIROCIN 2 % EX OINT
1.0000 | TOPICAL_OINTMENT | Freq: Two times a day (BID) | CUTANEOUS | 0 refills | Status: AC
  Filled 2024-03-30: qty 22, 11d supply, fill #0

## 2024-03-30 MED ORDER — OXYCODONE HCL 5 MG PO TABS
5.0000 mg | ORAL_TABLET | Freq: Four times a day (QID) | ORAL | 0 refills | Status: DC | PRN
  Filled 2024-03-30: qty 20, 5d supply, fill #0

## 2024-03-30 NOTE — Discharge Instructions (Addendum)
 Dear Denise Santana,   Thank you so much for allowing us  to be part of your care!  You were admitted to [facility] for clots in your legs that traveled to your lungs.  We gave you blood thinners and restarted you on your Eliquis  long term.  POST-HOSPITAL & CARE INSTRUCTIONS Please take your Eliquis  as described in the starter packet, your refills should be very affordable, please do NOT miss doses, as you will likely get more clots Please let PCP/Specialists know of any changes that were made.  Please see medications section of this packet for any medication changes.   DOCTOR'S APPOINTMENT & FOLLOW UP CARE INSTRUCTIONS    Follow-up Information     Denise Almarie SAUNDERS, MD. Schedule an appointment as soon as possible for a visit in 1 week(s).   Specialty: Family Medicine Contact information: [address] [place] [address] [phone]                RETURN PRECAUTIONS: Please come back if you experience worsening swelling and/or pain in your legs Also return if you experience worsening chest pain or shortness of breath  Take care and be well!  Family Medicine Teaching Service Inpatient Team [facility]  [address] [place], [place] [address] [phone]   Information on my medicine - ELIQUIS  (apixaban )  This medication education was reviewed with me or my healthcare representative as part of my discharge preparation.    Why was Eliquis  prescribed for you? Eliquis  was prescribed to treat blood clots that may have been found in the veins of your legs (deep vein thrombosis) or in your lungs (pulmonary embolism) and to reduce the risk of them occurring again.  What do You need to know about Eliquis  ? The starting dose is 10 mg (two 5 mg tablets) taken TWICE daily for the FIRST SEVEN (7) DAYS, then on (enter date)  04/06/2024  the dose is reduced to ONE 5 mg tablet taken TWICE daily.  Eliquis  may be taken with or without food.   Try to  take the dose about the same time in the morning and in the evening. If you have difficulty swallowing the tablet whole please discuss with your pharmacist how to take the medication safely.  Take Eliquis  exactly as prescribed and DO NOT stop taking Eliquis  without talking to the doctor who prescribed the medication.  Stopping may increase your risk of developing a new blood clot.  Refill your prescription before you run out.  After discharge, you should have regular check-up appointments with your healthcare provider that is prescribing your Eliquis .    What do you do if you miss a dose? If a dose of ELIQUIS  is not taken at the scheduled time, take it as soon as possible on the same day and twice-daily administration should be resumed. The dose should not be doubled to make up for a missed dose.  Important Safety Information A possible side effect of Eliquis  is bleeding. You should call your healthcare provider right away if you experience any of the following: Bleeding from an injury or your nose that does not stop. Unusual colored urine (red or dark brown) or unusual colored stools (red or black). Unusual bruising for unknown reasons. A serious fall or if you hit your head (even if there is no bleeding).  Some medicines may interact with Eliquis  and might increase your risk of bleeding or clotting while on Eliquis . To help avoid this, consult your healthcare  provider or pharmacist prior to using any new prescription or non-prescription medications, including herbals, vitamins, non-steroidal anti-inflammatory drugs (NSAIDs) and supplements.  This website has more information on Eliquis  (apixaban ): http://[email or web address] .com/eliquis dena

## 2024-03-30 NOTE — Plan of Care (Signed)
 Assumed care at 1900. This RN was notified by lab that results are positive for MRSA in the nares. MD notified. Isolation precautions were ordered. Pt had one emesis episode overnight. NO BM overnight. Pt has daughter at the bedside. Heparin  gtt continued. No significant events overnight. Pt pain was controlled overnight.    Problem: Education: Goal: Knowledge of General Education information will improve Description: Including pain rating scale, medication(s)/side effects and non-pharmacologic comfort measures Outcome: Progressing   Problem: Health Behavior/Discharge Planning: Goal: Ability to manage health-related needs will improve Outcome: Progressing   Problem: Clinical Measurements: Goal: Ability to maintain clinical measurements within normal limits will improve Outcome: Progressing Goal: Will remain free from infection Outcome: Progressing Goal: Diagnostic test results will improve Outcome: Progressing Goal: Respiratory complications will improve Outcome: Progressing Goal: Cardiovascular complication will be avoided Outcome: Progressing   Problem: Activity: Goal: Risk for activity intolerance will decrease Outcome: Progressing   Problem: Pain Managment: Goal: General experience of comfort will improve and/or be controlled Outcome: Progressing   Problem: Safety: Goal: Ability to remain free from injury will improve Outcome: Progressing

## 2024-03-30 NOTE — Progress Notes (Signed)
 Transition of Care [facility]) - Inpatient Brief Assessment   Patient Details  Name: Denise Santana MRN: [id number] Date of Birth: December 11, 1980  Transition of Care [facility]) CM/SW Contact:    Rosaline JONELLE Joe, RN Phone Number: 03/30/2024, 10:16 AM   Clinical Narrative: Patient admitted to the hospital from home with PE - patient currently on Heparin  drip.  Plan for discharge meds through [facility] pharmacy.  Patient is current smoker - resources provided in the AVS.  No IP Care management needs at this time.  CM will continue to follow the patient for needs as patient progresses.   Transition of Care Asessment: Insurance and Status: (P) Insurance coverage has been reviewed Patient has primary care physician: (P) Yes Home environment has been reviewed: (P) from home with family Prior level of function:: (P) Independent Prior/Current Home Services: (P) No current home services Social Drivers of Health Review: (P) SDOH reviewed no interventions necessary Readmission risk has been reviewed: (P) Yes Transition of care needs: (P) no transition of care needs at this time

## 2024-03-30 NOTE — Plan of Care (Signed)

## 2024-03-30 NOTE — Assessment & Plan Note (Deleted)
 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 - Continue heparin  gtt - Pain regimen: Acetaminophen  650 mg Q6h SCH, Oxycodone  5 mg Q6h [facility] - Continuous cardiac monitoring - EKG T-wave inversions  - Troponin I 54 --> 40 - Inpatient pharmacy assistance --> Xarelto  or Eliquis  $4 copay - Plan for outpatient Heme referral - AM CBC

## 2024-03-30 NOTE — Assessment & Plan Note (Deleted)
 Left lower extremity pain and swelling.  Patient states this is a previous pain to her prior right DVT.  DVT ultrasound shows acute on chronic right DVT and acute left DVT. - Heparin  gtt - Pain management as above

## 2024-03-30 NOTE — Discharge Summary (Cosign Needed Addendum)
 Family Medicine Teaching [facility] Discharge Summary  Patient name: Denise Santana Medical record number: [id number] Date of birth: 03-May-1981 Age: 43 y.o. Gender: female Date of Admission: 03/28/2024  Date of Discharge: 03/30/2024 Admitting Physician: Raguel KANDICE Lee, DO  Primary Care Provider: Waylan Almarie SAUNDERS, MD Consultants: None  Indication for Hospitalization: Bilateral PEs  Discharge Diagnoses/Problem List:  Principal Problem for Admission: Bilateral PEs and acute bilateral DVTs Other Problems addressed during stay:  Principal Problem:   Pulmonary embolism (HCC) Active Problems:   AKI (acute kidney injury) (HCC)   DVT (deep venous thrombosis) [facility])   Brief Hospital Course:  Denise Santana is a 43 y.o. year old with a history of DVTs and PEs who presented with lower extremity pain and shortness of breath and was admitted to the [facility] Medicine Teaching Service for DVT and PE.  Pulmonary Embolism Patient with known history of PEs presenting with dyspnea. CT Angio chest showed bilateral pulmonary emboli. Patient treated with heparin  drip and transitioned to Eliquis  10 mg BID for 7 days followed by 5 mg BID.  DVT Patient with known history of DVTs presenting with lower extremity pain, L>R; B/l LE venous US  showed acute on chronic DVT of R posterior tibial and peroneal veins and acute DVT of L popliteal, posterior tibial, and peroneal veins; also with chronic DVT of R popliteal vein. Patient treated with heparin  drip and transitioned to Eliquis .  Pain treated with Tylenol  and Oxycodone .  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 One episode of vomiting while inpatient, no episodes of diarrhea, patient was treated with IV fluids (see AKI for details). Discharged with Zofran -ODT.    AKI Cr on admission of 1.46; unknown baseline. Suspected AKI in setting of  dehydration given recent vomiting/diarrhea. Given LR bolus of 400 mL and then maintenance fluids with LR 100 mL/hr. Cr improved to 1.21 at time of discharge.  MRSA Detected on nares swab. Prescribed Mupirocin  ointment 2% BID for 5 days.  PCP Follow-up Recommendations: Age and sex appropriate malignancy screening. Outpatient hematology referral for hypercoagulability workup. Consider supplementing folate and Vitamin B12 with macrocytic anemia.   Results/Tests Pending at Time of Discharge:  Unresulted Labs (From admission, onward)     Start     Ordered   03/30/24 1103  Basic metabolic panel with GFR  Once,   R       Question:  Specimen collection method  Answer:  Lab=Lab collect   03/30/24 1103   03/30/24 1103  TSH  Once,   R       Question:  Specimen collection method  Answer:  Lab=Lab collect   03/30/24 1103   03/30/24 1103  Lipid panel  Once,   R       Question:  Specimen collection method  Answer:  Lab=Lab collect   03/30/24 1103   03/29/24 1942  Gastrointestinal Panel by PCR , Stool  (Gastrointestinal Panel by PCR, Stool                                                                                                                                                     **  Does Not include CLOSTRIDIUM DIFFICILE testing. **If CDIFF testing is needed, place order from the C Difficile Testing order set.**)  Once,   R        03/29/24 1941           Disposition: Home  Discharge Condition: Medically stable for discharge on Eliquis  for PEs and DVTs  Discharge Exam:  Vitals:   03/30/24 0819 03/30/24 1220  BP: 105/69 104/61  Pulse: 78 80  Resp:    Temp: 98.5 F (36.9 C) 98.6 F (37 C)  SpO2: 94% 94%   General: Awake, alert, no acute distress Cardiovascular: RRR, no M/R/G Respiratory: CTAB, normal work of breathing on room air Abdomen: Nondistended, soft, bowel sounds present, nontender to palpation Extremities: Nonpitting edema R>L  Significant Labs and Imaging:  Recent  Labs  Lab 03/28/24 2256 03/30/24 0433  WBC 7.9 5.4  HGB 12.6 11.2*  HCT 35.7* 32.4*  PLT 106* 88*   Recent Labs  Lab 03/28/24 2256 03/30/24 0433  NA 138 139  K 3.5 3.2*  CL 105 107  CO2 23 24  GLUCOSE 107* 122*  BUN 13 10  CREATININE 1.46* 1.21*  CALCIUM 8.8* 8.5*  ALKPHOS 91  --   AST 15  --   ALT 14  --   ALBUMIN  3.6  --     Pertinent Imaging    CTA chest Bilateral pulmonary emboli as described below Filling defects in the right lower lobe pulmonary artery and right upper lobar and segmental pulmonary arteries as well as distal left lower lobar and segmental/subsegmental branches compatible with pulmonary emboli. Some of these are peripheral/wall adherent in nature and may represent some chronic PE.  Bilateral lower extremity venous ultrasound Right: - Findings consistent with acute on chronic deep vein thrombosis involving  the right posterior tibial veins, and right peroneal veins.  - Findings consistent with chronic deep vein thrombosis involving the  right popliteal vein.  - No cystic structure found in the popliteal fossa.   Left: - Findings consistent with acute deep vein thrombosis involving the left  popliteal vein, left posterior tibial veins, and left peroneal veins.  Findings consistent with acute intramuscular thrombosis involving the left  gastrocnemius veins.  - No cystic structure found in the popliteal fossa.   Discharge Medications:  Allergies as of 03/30/2024   No Known Allergies      Medication List     TAKE these medications    acetaminophen  325 MG tablet Commonly known as: TYLENOL  Take 2 tablets (650 mg total) by mouth every 6 (six) hours.   Eliquis  DVT/PE Starter Pack Generic drug: Apixaban  Starter Pack (10mg  and 5mg ) Take as directed on package: start with two-5mg  tablets twice daily for 7 days. On day 8, switch to one-5mg  tablet twice daily.   mupirocin  ointment 2 % Commonly known as: BACTROBAN  Place 1 Application into  the nose 2 (two) times daily for 5 days.   ondansetron  4 MG disintegrating tablet Commonly known as: ZOFRAN -ODT Take 1 tablet (4 mg total) by mouth every 8 (eight) hours as needed for nausea or vomiting.   oxyCODONE  5 MG immediate release tablet Commonly known as: Oxy IR/ROXICODONE  Take 1 tablet (5 mg total) by mouth every 6 (six) hours as needed for severe pain (pain score 7-10).        Discharge Instructions: Please refer to Patient Instructions section of EMR for full details.  Patient was counseled important signs and symptoms that should prompt return to medical care, changes in  medications, dietary instructions, activity restrictions, and follow up appointments.   Follow-Up Appointments:  Follow-up Information     Waylan Almarie SAUNDERS, MD. Schedule an appointment as soon as possible for a visit in 1 week(s).   Specialty: Family Medicine Contact information: [address] [place] [address] [phone]                 Lennie Raguel MATSU, DO 03/30/2024, 1:51 PM PGY-1, [facility]   FMTS Upper-Level Resident Addendum   I have independently interviewed and examined the patient. I have discussed the above with Dr. Lennie and agree with the documented plan. My edits for correction/addition/clarification are included above. Please see any attending notes.   Kathrine Melena, DO PGY-2, [facility] 03/30/2024 2:34 PM

## 2024-03-30 NOTE — Assessment & Plan Note (Deleted)
 Creatinine improving, 1.21 today.  Unknown baseline, but suspect AKI 2/2 dehydration in the setting of vomiting/diarrhea. - Zofran  Q6 PRN for nausea - GIPP and enteric precautions - AM BMP

## 2024-04-25 ENCOUNTER — Emergency Department (HOSPITAL_COMMUNITY)

## 2024-04-25 ENCOUNTER — Encounter (HOSPITAL_COMMUNITY): Payer: Self-pay | Admitting: *Deleted

## 2024-04-25 ENCOUNTER — Emergency Department (HOSPITAL_COMMUNITY)
Admission: EM | Admit: 2024-04-25 | Discharge: 2024-04-25 | Disposition: A | Discharge status: Home / Self Care | Attending: Emergency Medicine | Admitting: Emergency Medicine

## 2024-04-25 ENCOUNTER — Other Ambulatory Visit: Payer: Self-pay

## 2024-04-25 DIAGNOSIS — R600 Localized edema: Secondary | ICD-10-CM | POA: Diagnosis not present

## 2024-04-25 DIAGNOSIS — R079 Chest pain, unspecified: Secondary | ICD-10-CM

## 2024-04-25 DIAGNOSIS — Z7901 Long term (current) use of anticoagulants: Secondary | ICD-10-CM | POA: Insufficient documentation

## 2024-04-25 DIAGNOSIS — I2699 Other pulmonary embolism without acute cor pulmonale: Secondary | ICD-10-CM | POA: Diagnosis not present

## 2024-04-25 LAB — URINALYSIS, ROUTINE W REFLEX MICROSCOPIC
Bacteria, UA: NONE SEEN
Bilirubin Urine: NEGATIVE
Glucose, UA: NEGATIVE mg/dL
Ketones, ur: NEGATIVE mg/dL
Nitrite: NEGATIVE
Protein, ur: 30 mg/dL — AB
Specific Gravity, Urine: 1.01 (ref 1.005–1.030)
pH: 7 (ref 5.0–8.0)

## 2024-04-25 LAB — LIPASE, BLOOD: Lipase: 23 U/L (ref 11–51)

## 2024-04-25 LAB — COMPREHENSIVE METABOLIC PANEL WITH GFR
ALT: 13 U/L (ref 0–44)
AST: 12 U/L — ABNORMAL LOW (ref 15–41)
Albumin: 3.9 g/dL (ref 3.5–5.0)
Alkaline Phosphatase: 92 U/L (ref 38–126)
Anion gap: 12 (ref 5–15)
BUN: 8 mg/dL (ref 6–20)
CO2: 24 mmol/L (ref 22–32)
Calcium: 9.1 mg/dL (ref 8.9–10.3)
Chloride: 105 mmol/L (ref 98–111)
Creatinine, Ser: 1.19 mg/dL — ABNORMAL HIGH (ref 0.44–1.00)
GFR, Estimated: 59 mL/min — ABNORMAL LOW (ref >60)
Glucose, Bld: 102 mg/dL — ABNORMAL HIGH (ref 70–99)
Potassium: 3.5 mmol/L (ref 3.5–5.1)
Sodium: 141 mmol/L (ref 135–145)
Total Bilirubin: 1.3 mg/dL — ABNORMAL HIGH (ref 0.0–1.2)
Total Protein: 6.7 g/dL (ref 6.5–8.1)

## 2024-04-25 LAB — CBC
HCT: 35 % — ABNORMAL LOW (ref 36.0–46.0)
Hemoglobin: 12.1 g/dL (ref 12.0–15.0)
MCH: 35.3 pg — ABNORMAL HIGH (ref 26.0–34.0)
MCHC: 34.6 g/dL (ref 30.0–36.0)
MCV: 102 fL — ABNORMAL HIGH (ref 80.0–100.0)
Platelets: 164 K/uL (ref 150–400)
RBC: 3.43 MIL/uL — ABNORMAL LOW (ref 3.87–5.11)
RDW: 14.1 % (ref 11.5–15.5)
WBC: 10.5 K/uL (ref 4.0–10.5)
nRBC: 0 % (ref 0.0–0.2)

## 2024-04-25 LAB — TROPONIN I (HIGH SENSITIVITY)
Troponin I (High Sensitivity): 6 ng/L (ref <18)
Troponin I (High Sensitivity): 6 ng/L (ref <18)

## 2024-04-25 LAB — HCG, SERUM, QUALITATIVE: Preg, Serum: NEGATIVE

## 2024-04-25 MED ORDER — OXYCODONE-ACETAMINOPHEN 5-325 MG PO TABS
1.0000 | ORAL_TABLET | Freq: Once | ORAL | Status: AC
  Administered 2024-04-25: 1 via ORAL
  Filled 2024-04-25: qty 1

## 2024-04-25 MED ORDER — IOHEXOL 350 MG/ML SOLN
75.0000 mL | Freq: Once | INTRAVENOUS | Status: AC | PRN
  Administered 2024-04-25: 75 mL via INTRAVENOUS

## 2024-04-25 NOTE — Discharge Instructions (Signed)
 As we discussed, clots appear improved on today's CT.  We do need to continue your Eliquis  as directed. Follow-up with your doctor. Return here for new concerns.

## 2024-04-25 NOTE — ED Triage Notes (Signed)
 The pt reports that she has had multiple blood clots and her most recent is in her lt leg  she has had clots everywhere since 2019  tonight ?? At work she felt dizzy nauseated hyperventilating at present

## 2024-04-25 NOTE — ED Provider Notes (Signed)
 [place] [facility] AT [facility] Provider Note   CSN: [id number] Arrival date & time: 04/25/24  0153     Patient presents with: Dizziness, Nausea, and Near Syncope   Denise Santana is a 43 y.o. female.   The history is provided by the patient and medical records.  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  States she has been feeling this way over the past 2 days but acutely worse today.  States she is worried she has another blood clot.  She denies any cough, fever, chills, or other infectious symptoms.  She reports compliance with her Eliquis  without any issues aside from some easy bruising.  She has not had any active bleeding.  She has not had any outpatient follow-up since her last hospitalization for DVT and PE last month.  Does have some LE edema as well-- has this chronically but thinks may be a little worse than usual.  Prior to Admission medications   Medication Sig Start Date End Date Taking? Authorizing Provider  acetaminophen  (TYLENOL ) 325 MG tablet Take 2 tablets (650 mg total) by mouth every 6 (six) hours. 03/30/24   Gomes, Adriana, DO  APIXABAN  (ELIQUIS ) VTE STARTER PACK (10MG  AND 5MG ) Take as directed on package: start with two-5mg  tablets twice daily for 7 days. On day 8, switch to one-5mg  tablet twice daily. 03/30/24   Gomes, Adriana, DO  ondansetron  (ZOFRAN -ODT) 4 MG disintegrating tablet Take 1 tablet (4 mg total) by mouth every 8 (eight) hours as needed for nausea or vomiting. 03/30/24   Janna Ferrier, DO  oxyCODONE  (OXY IR/ROXICODONE ) 5 MG immediate release tablet Take 1 tablet (5 mg total) by mouth every 6 (six) hours as needed for severe pain (pain score 7-10). 03/30/24   Janna Ferrier, DO    Allergies: Patient has no known allergies.    Review of Systems  Cardiovascular:  Positive for  near-syncope.  Neurological:  Positive for dizziness.  All other systems reviewed and are negative.   Updated Vital Signs BP (!) 143/83 (BP Location: Right Arm)   Pulse 88   Temp 99.5 F (37.5 C)   Resp 18   Ht 5' 7 (1.702 m)   Wt 124.2 kg   LMP 03/01/2024   SpO2 96%   BMI 42.88 kg/m   Physical Exam Vitals and nursing note reviewed.  Constitutional:      Appearance: She is well-developed.     Comments: Hood of sweatshirt pulled over face, NAD  HENT:     Head: Normocephalic and atraumatic.  Eyes:     Conjunctiva/sclera: Conjunctivae normal.     Pupils: Pupils are equal, round, and reactive to light.  Cardiovascular:     Rate and Rhythm: Normal rate and regular rhythm.     Heart sounds: Normal heart sounds.  Pulmonary:     Effort: Pulmonary effort is normal.     Breath sounds: Normal breath sounds. No stridor. No wheezing.  Musculoskeletal:        General: Normal range of motion.     Cervical back: Normal range of motion.     Comments: Chronic appearing edema of BLE, grossly symmetric  Skin:    General: Skin is warm and dry.  Neurological:     Mental Status: She is alert and oriented to person, place, and time.     (  all labs ordered are listed, but only abnormal results are displayed) Labs Reviewed  COMPREHENSIVE METABOLIC PANEL WITH GFR - Abnormal; Notable for the following components:      Result Value   Glucose, Bld 102 (*)    Creatinine, Ser 1.19 (*)    AST 12 (*)    Total Bilirubin 1.3 (*)    GFR, Estimated 59 (*)    All other components within normal limits  CBC - Abnormal; Notable for the following components:   RBC 3.43 (*)    HCT 35.0 (*)    MCV 102.0 (*)    MCH 35.3 (*)    All other components within normal limits  URINALYSIS, ROUTINE W REFLEX MICROSCOPIC - Abnormal; Notable for the following components:   Hgb urine dipstick MODERATE (*)    Protein, ur 30 (*)    Leukocytes,Ua SMALL (*)    All other components within normal limits  LIPASE,  BLOOD  HCG, SERUM, QUALITATIVE  TROPONIN I (HIGH SENSITIVITY)  TROPONIN I (HIGH SENSITIVITY)    EKG: None  Radiology: CT Angio Chest PE W and/or Wo Contrast Result Date: 04/25/2024 CLINICAL DATA:  Recurrent pulmonary embolism, chest pain, dyspnea, dizziness EXAM: CT ANGIOGRAPHY CHEST WITH CONTRAST TECHNIQUE: Multidetector CT imaging of the chest was performed using the standard protocol during bolus administration of intravenous contrast. Multiplanar CT image reconstructions and MIPs were obtained to evaluate the vascular anatomy. RADIATION DOSE REDUCTION: This exam was performed according to the departmental dose-optimization program which includes automated exposure control, adjustment of the mA and/or kV according to patient size and/or use of iterative reconstruction technique. CONTRAST:  75mL OMNIPAQUE  IOHEXOL  350 MG/ML SOLN COMPARISON:  03/29/2024 FINDINGS: Cardiovascular: There is slightly suboptimal opacification of the pulmonary arterial tree, however, the examination is still diagnostic. Multiple intraluminal filling defects persist in keeping with chronic pulmonary embolism, slightly improved since prior examination in keeping partial clot lysis. No new filling defects are identified to suggest recurrent pulmonary embolism. Stable borderline enlargement of the central pulmonary arteries in keeping with changes of pulmonary arterial hypertension. Cardiac size is mildly enlarged, however, global cardiac size has decreased since prior examination and there is no further CT evidence of right heart strain. No pericardial effusion. The thoracic aorta is unremarkable. Mediastinum/Nodes: No enlarged mediastinal, hilar, or axillary lymph nodes. Thyroid gland, trachea, and esophagus demonstrate no significant findings. Lungs/Pleura: Bibasilar atelectasis. No confluent pulmonary infiltrate. No pneumothorax or pleural effusion. No central obstructing lesion. Upper Abdomen: No acute abnormality.  Musculoskeletal: No chest wall abnormality. No acute or significant osseous findings. Review of the MIP images confirms the above findings. IMPRESSION: 1. Slight interval improvement in chronic pulmonary embolism. No new filling defects are identified to suggest recurrent pulmonary embolism. 2. Stable borderline enlargement of the central pulmonary arteries in keeping with changes of pulmonary arterial hypertension. 3. Mild cardiomegaly persists. However, global cardiac size has decreased since prior examination and there is no further CT evidence of right heart strain. Electronically Signed   By: Dorethia Molt M.D.   On: 04/25/2024 03:54     Procedures   Medications Ordered in the ED  oxyCODONE -acetaminophen  (PERCOCET/ROXICET) 5-325 MG per tablet 1 tablet (1 tablet Oral Given 04/25/24 0324)  iohexol  (OMNIPAQUE ) 350 MG/ML injection 75 mL (75 mLs Intravenous Contrast Given 04/25/24 0344)                                    Medical Decision  Making Amount and/or Complexity of Data Reviewed Labs: ordered. Radiology: ordered and independent interpretation performed. ECG/medicine tests: ordered and independent interpretation performed.  Risk Prescription drug management.   43 year old female presenting to the ED with chest pain, shortness of breath, and lightheadedness.  Does have history of DVT and PE, currently on Eliquis .  She reports compliance with this.  States symptoms over the past few days but acutely worse this evening.  She is afebrile and nontoxic in appearance here.  Her vitals are stable.  She does seem a bit anxious.  Labs have been sent.  Given her history, will repeat CTA.  She does have some lower extremity edema, however this appears more chronic.  There is no gross asymmetry or palpable cord so I have lower suspicion for acute DVT at this time.  Labs as above--no leukocytosis or significant electrolyte derangement.  Troponin negative x 2.  CTA with improved areas of prior PE,  no new clot identified.  Prior heart strain appears to have resolved as well.  Remains HD stable.  Appears more calm on repeat assessment.  Feel she is stable for discharge.  Encouraged close PCP follow-up.  Can return here for new concerns.  Final diagnoses:  Chest pain in adult    ED Discharge Orders     None          Jarold Olam HERO, PA-C 04/25/24 0516    Theadore Ozell HERO, MD 04/25/24 [phone]

## 2024-05-08 ENCOUNTER — Emergency Department (HOSPITAL_COMMUNITY)

## 2024-05-08 ENCOUNTER — Inpatient Hospital Stay (HOSPITAL_COMMUNITY)
Admission: EM | Admit: 2024-05-08 | Discharge: 2024-05-10 | DRG: 173 | Discharge status: Against Medical Advice | Attending: Internal Medicine | Admitting: Internal Medicine

## 2024-05-08 ENCOUNTER — Encounter (HOSPITAL_COMMUNITY): Payer: Self-pay | Admitting: Emergency Medicine

## 2024-05-08 DIAGNOSIS — Z832 Family history of diseases of the blood and blood-forming organs and certain disorders involving the immune mechanism: Secondary | ICD-10-CM

## 2024-05-08 DIAGNOSIS — E538 Deficiency of other specified B group vitamins: Secondary | ICD-10-CM | POA: Diagnosis present

## 2024-05-08 DIAGNOSIS — Z91141 Patient's other noncompliance with medication regimen due to financial hardship: Secondary | ICD-10-CM | POA: Diagnosis not present

## 2024-05-08 DIAGNOSIS — I2721 Secondary pulmonary arterial hypertension: Secondary | ICD-10-CM | POA: Diagnosis present

## 2024-05-08 DIAGNOSIS — E876 Hypokalemia: Secondary | ICD-10-CM | POA: Diagnosis present

## 2024-05-08 DIAGNOSIS — F17210 Nicotine dependence, cigarettes, uncomplicated: Secondary | ICD-10-CM | POA: Diagnosis present

## 2024-05-08 DIAGNOSIS — Z79899 Other long term (current) drug therapy: Secondary | ICD-10-CM

## 2024-05-08 DIAGNOSIS — I2699 Other pulmonary embolism without acute cor pulmonale: Secondary | ICD-10-CM | POA: Diagnosis present

## 2024-05-08 DIAGNOSIS — Z86711 Personal history of pulmonary embolism: Secondary | ICD-10-CM

## 2024-05-08 DIAGNOSIS — Z86718 Personal history of other venous thrombosis and embolism: Secondary | ICD-10-CM

## 2024-05-08 DIAGNOSIS — Z83438 Family history of other disorder of lipoprotein metabolism and other lipidemia: Secondary | ICD-10-CM

## 2024-05-08 DIAGNOSIS — Z833 Family history of diabetes mellitus: Secondary | ICD-10-CM | POA: Diagnosis not present

## 2024-05-08 DIAGNOSIS — J9601 Acute respiratory failure with hypoxia: Secondary | ICD-10-CM | POA: Diagnosis present

## 2024-05-08 DIAGNOSIS — Z7901 Long term (current) use of anticoagulants: Secondary | ICD-10-CM | POA: Diagnosis not present

## 2024-05-08 DIAGNOSIS — Z6841 Body Mass Index (BMI) 40.0 and over, adult: Secondary | ICD-10-CM

## 2024-05-08 DIAGNOSIS — Z1152 Encounter for screening for COVID-19: Secondary | ICD-10-CM | POA: Diagnosis not present

## 2024-05-08 DIAGNOSIS — T45516A Underdosing of anticoagulants, initial encounter: Secondary | ICD-10-CM | POA: Diagnosis present

## 2024-05-08 DIAGNOSIS — I2609 Other pulmonary embolism with acute cor pulmonale: Secondary | ICD-10-CM | POA: Diagnosis not present

## 2024-05-08 DIAGNOSIS — Z8249 Family history of ischemic heart disease and other diseases of the circulatory system: Secondary | ICD-10-CM | POA: Diagnosis not present

## 2024-05-08 LAB — CBC
HCT: 34.3 % — ABNORMAL LOW (ref 36.0–46.0)
Hemoglobin: 11.7 g/dL — ABNORMAL LOW (ref 12.0–15.0)
MCH: 34.5 pg — ABNORMAL HIGH (ref 26.0–34.0)
MCHC: 34.1 g/dL (ref 30.0–36.0)
MCV: 101.2 fL — ABNORMAL HIGH (ref 80.0–100.0)
Platelets: 169 K/uL (ref 150–400)
RBC: 3.39 MIL/uL — ABNORMAL LOW (ref 3.87–5.11)
RDW: 13.7 % (ref 11.5–15.5)
WBC: 8.1 K/uL (ref 4.0–10.5)
nRBC: 0 % (ref 0.0–0.2)

## 2024-05-08 LAB — BASIC METABOLIC PANEL WITH GFR
Anion gap: 10 (ref 5–15)
BUN: 7 mg/dL (ref 6–20)
CO2: 21 mmol/L — ABNORMAL LOW (ref 22–32)
Calcium: 8.9 mg/dL (ref 8.9–10.3)
Chloride: 106 mmol/L (ref 98–111)
Creatinine, Ser: 1.15 mg/dL — ABNORMAL HIGH (ref 0.44–1.00)
GFR, Estimated: >60 mL/min (ref >60)
Glucose, Bld: 100 mg/dL — ABNORMAL HIGH (ref 70–99)
Potassium: 2.9 mmol/L — ABNORMAL LOW (ref 3.5–5.1)
Sodium: 137 mmol/L (ref 135–145)

## 2024-05-08 LAB — RESP PANEL BY RT-PCR (RSV, FLU A&B, COVID)  RVPGX2
Influenza A by PCR: NEGATIVE
Influenza B by PCR: NEGATIVE
Resp Syncytial Virus by PCR: NEGATIVE
SARS Coronavirus 2 by RT PCR: NEGATIVE

## 2024-05-08 LAB — TROPONIN I (HIGH SENSITIVITY)
Troponin I (High Sensitivity): 25 ng/L — ABNORMAL HIGH (ref <18)
Troponin I (High Sensitivity): 27 ng/L — ABNORMAL HIGH (ref <18)

## 2024-05-08 LAB — D-DIMER, QUANTITATIVE: D-Dimer, Quant: 4.61 ug{FEU}/mL — ABNORMAL HIGH (ref 0.00–0.50)

## 2024-05-08 LAB — HCG, SERUM, QUALITATIVE: Preg, Serum: NEGATIVE

## 2024-05-08 MED ORDER — POTASSIUM CHLORIDE 10 MEQ/100ML IV SOLN
10.0000 meq | Freq: Once | INTRAVENOUS | Status: AC
  Administered 2024-05-09: 10 meq via INTRAVENOUS
  Filled 2024-05-08: qty 100

## 2024-05-08 MED ORDER — ACETAMINOPHEN 325 MG PO TABS: 650.0000 mg | ORAL_TABLET | Freq: Four times a day (QID) | ORAL | Status: DC | PRN

## 2024-05-08 MED ORDER — APIXABAN 5 MG PO TABS
5.0000 mg | ORAL_TABLET | Freq: Once | ORAL | Status: AC
  Administered 2024-05-08: 5 mg via ORAL
  Filled 2024-05-08: qty 1

## 2024-05-08 MED ORDER — MORPHINE SULFATE (PF) 2 MG/ML IV SOLN
1.0000 mg | Freq: Once | INTRAVENOUS | Status: AC
  Administered 2024-05-08: 1 mg via INTRAVENOUS
  Filled 2024-05-08: qty 1

## 2024-05-08 MED ORDER — LIDOCAINE 5 % EX PTCH
1.0000 | MEDICATED_PATCH | Freq: Once | CUTANEOUS | Status: AC
  Administered 2024-05-08: 1 via TRANSDERMAL
  Filled 2024-05-08: qty 1

## 2024-05-08 MED ORDER — HEPARIN (PORCINE) 25000 UT/250ML-% IV SOLN
1150.0000 [IU]/h | INTRAVENOUS | Status: DC
  Administered 2024-05-08 – 2024-05-09 (×2): 1500 [IU]/h via INTRAVENOUS
  Administered 2024-05-10: 1050 [IU]/h via INTRAVENOUS
  Filled 2024-05-08 (×3): qty 250

## 2024-05-08 MED ORDER — LACTATED RINGERS IV BOLUS: 1000.0000 mL | Freq: Once | INTRAVENOUS | Status: DC

## 2024-05-08 MED ORDER — ACETAMINOPHEN 650 MG RE SUPP: 650.0000 mg | Freq: Four times a day (QID) | RECTAL | Status: DC | PRN

## 2024-05-08 MED ORDER — POLYETHYLENE GLYCOL 3350 17 G PO PACK: 17.0000 g | PACK | Freq: Every day | ORAL | Status: DC | PRN

## 2024-05-08 MED ORDER — OXYCODONE-ACETAMINOPHEN 5-325 MG PO TABS
1.0000 | ORAL_TABLET | Freq: Once | ORAL | Status: AC
  Administered 2024-05-08: 1 via ORAL
  Filled 2024-05-08: qty 1

## 2024-05-08 MED ORDER — PROCHLORPERAZINE EDISYLATE 10 MG/2ML IJ SOLN: 5.0000 mg | Freq: Four times a day (QID) | INTRAMUSCULAR | Status: DC | PRN

## 2024-05-08 MED ORDER — SODIUM CHLORIDE 0.9 % IV SOLN: INTRAVENOUS | Status: AC

## 2024-05-08 MED ORDER — IOHEXOL 350 MG/ML SOLN
80.0000 mL | Freq: Once | INTRAVENOUS | Status: AC | PRN
  Administered 2024-05-08: 80 mL via INTRAVENOUS

## 2024-05-08 MED ORDER — POTASSIUM CHLORIDE CRYS ER 20 MEQ PO TBCR
40.0000 meq | EXTENDED_RELEASE_TABLET | Freq: Once | ORAL | Status: AC
  Administered 2024-05-08: 40 meq via ORAL

## 2024-05-08 MED ORDER — SODIUM CHLORIDE 0.9% FLUSH
3.0000 mL | Freq: Two times a day (BID) | INTRAVENOUS | Status: DC
  Administered 2024-05-09 – 2024-05-10 (×4): 3 mL via INTRAVENOUS

## 2024-05-08 MED ORDER — OXYCODONE HCL 5 MG PO TABS
5.0000 mg | ORAL_TABLET | ORAL | Status: DC | PRN
  Administered 2024-05-09 – 2024-05-10 (×4): 10 mg via ORAL
  Filled 2024-05-08 (×4): qty 2

## 2024-05-08 NOTE — ED Provider Notes (Signed)
 [place] [facility] DEPARTMENT AT [facility] Provider Note   CSN: [id number] Arrival date & time: 05/08/24  1727  Patient presents with: Chest Pain  Denise Santana is a 43 y.o. female.   -L sided lateral chest pain beginning yesterday, radiates to L shoulder -Pain worse with breathing, moving  -Coughing, productive of phlegm, no blood -Feels SOB with moving  -Takes eliquis  BID for hx PE, DVT - no recent missed doses, last took this morning  -Current smoker -No recent fever -Endorses N/V, no diarrhea  -Reports chronic BLE edema   Chest Pain Associated symptoms: cough, nausea, shortness of breath and vomiting   Associated symptoms: no fever    Prior to Admission medications   Medication Sig Start Date End Date Taking? Authorizing Provider  acetaminophen  (TYLENOL ) 325 MG tablet Take 2 tablets (650 mg total) by mouth every 6 (six) hours. 03/30/24  Yes Janna Ferrier, DO  APIXABAN  (ELIQUIS ) VTE STARTER PACK (10MG  AND 5MG ) Take as directed on package: start with two-5mg  tablets twice daily for 7 days. On day 8, switch to one-5mg  tablet twice daily. Patient taking differently: Take 5 mg by mouth in the morning and at bedtime. 03/30/24  Yes Janna Ferrier, DO  ondansetron  (ZOFRAN -ODT) 4 MG disintegrating tablet Take 1 tablet (4 mg total) by mouth every 8 (eight) hours as needed for nausea or vomiting. Patient not taking: Reported on 05/08/2024 03/30/24   Gomes, Adriana, DO  oxyCODONE  (OXY IR/ROXICODONE ) 5 MG immediate release tablet Take 1 tablet (5 mg total) by mouth every 6 (six) hours as needed for severe pain (pain score 7-10). Patient not taking: Reported on 05/08/2024 03/30/24   Gomes, Adriana, DO    Allergies: Patient has no known allergies.    Review of Systems  Constitutional:  Negative for fever.  Respiratory:  Positive for cough and shortness of breath.   Cardiovascular:  Positive for chest pain.  Gastrointestinal:  Positive for nausea and vomiting. Negative for  diarrhea.    Updated Vital Signs BP 119/77   Pulse 87   Temp 98 F (36.7 C) (Oral)   Resp 18   SpO2 100%   Physical Exam Cardiovascular:     Rate and Rhythm: Normal rate and regular rhythm.     Heart sounds: Normal heart sounds.  Pulmonary:     Effort: Pulmonary effort is normal. No tachypnea or accessory muscle usage.     Breath sounds: Examination of the right-lower field reveals decreased breath sounds. Examination of the left-lower field reveals decreased breath sounds. Decreased breath sounds present.  Chest:     Chest wall: Tenderness (L lateral chest wall) present.  Abdominal:     Palpations: Abdomen is soft.     Tenderness: There is no abdominal tenderness.  Musculoskeletal:     Right lower leg: Edema (chronic) present.     Left lower leg: Edema (chronic) present.  Skin:    General: Skin is warm and dry.    (all labs ordered are listed, but only abnormal results are displayed) Labs Reviewed  BASIC METABOLIC PANEL WITH GFR - Abnormal; Notable for the following components:      Result Value   Potassium 2.9 (*)    CO2 21 (*)    Glucose, Bld 100 (*)    Creatinine, Ser 1.15 (*)    All other components within normal limits  CBC - Abnormal; Notable for the following components:   RBC 3.39 (*)    Hemoglobin 11.7 (*)    HCT 34.3 (*)  MCV 101.2 (*)    MCH 34.5 (*)    All other components within normal limits  D-DIMER, QUANTITATIVE - Abnormal; Notable for the following components:   D-Dimer, Quant 4.61 (*)    All other components within normal limits  TROPONIN I (HIGH SENSITIVITY) - Abnormal; Notable for the following components:   Troponin I (High Sensitivity) 25 (*)    All other components within normal limits  TROPONIN I (HIGH SENSITIVITY) - Abnormal; Notable for the following components:   Troponin I (High Sensitivity) 27 (*)    All other components within normal limits  RESP PANEL BY RT-PCR (RSV, FLU A&B, COVID)  RVPGX2  HCG, SERUM, QUALITATIVE     EKG: EKG Interpretation Date/Time:  Saturday May 08 2024 19:02:12 EDT Ventricular Rate:  89 PR Interval:  129 QRS Duration:  89 QT Interval:  402 QTC Calculation: 490 R Axis:   26  Text Interpretation: Sinus rhythm Abnormal T, consider ischemia, anterior leads No significant change since last tracing Confirmed by Doretha Folks ([id number]) on 05/08/2024 7:10:34 PM  Radiology: CT Angio Chest PE W and/or Wo Contrast Result Date: 05/08/2024 CLINICAL DATA:  Pulmonary embolus suspected with low to intermediate probability. Positive D-dimer. Chest pain. EXAM: CT ANGIOGRAPHY CHEST WITH CONTRAST TECHNIQUE: Multidetector CT imaging of the chest was performed using the standard protocol during bolus administration of intravenous contrast. Multiplanar CT image reconstructions and MIPs were obtained to evaluate the vascular anatomy. RADIATION DOSE REDUCTION: This exam was performed according to the departmental dose-optimization program which includes automated exposure control, adjustment of the mA and/or kV according to patient size and/or use of iterative reconstruction technique. CONTRAST:  80mL OMNIPAQUE  IOHEXOL  350 MG/ML SOLN COMPARISON:  Chest radiograph 05/08/2024.  CT chest 04/25/2024 FINDINGS: Cardiovascular: Technically adequate study with good opacification of the central and segmental pulmonary arteries. Mild motion artifact. Multiple filling defects are demonstrated within the distal main pulmonary arteries with extension into bilateral upper and lower lobe segmental pulmonary arteries. Compared with the previous study, there is significant increase in the clot burden and in the number of pulmonary arteries involved. This is consistent with progression of pulmonary embolus. Mild cardiac enlargement. RV to LV ratio is increased at 1.1 suggesting evidence of right heart strain. This also has progressed since the prior study. Normal caliber thoracic aorta. Great vessel origins are patent. No  aortic dissection. Mediastinum/Nodes: No enlarged mediastinal, hilar, or axillary lymph nodes. Thyroid gland, trachea, and esophagus demonstrate no significant findings. Lungs/Pleura: Patchy areas of airspace consolidation in the lungs, mostly peripheral. This is progressing since prior study and may indicate progressive pulmonary infarcts. Infectious or inflammatory process could also have this appearance. Follow-up after resolution of acute process to exclude underlying pulmonary nodules. Upper Abdomen: Surgical absence of the gallbladder. No acute abnormalities. Musculoskeletal: No acute bony abnormalities. Review of the MIP images confirms the above findings. IMPRESSION: 1. Positive examination for multiple bilateral pulmonary emboli. There is progression of clot burden since previous study. CT evidence of right heart strain with RV to LV ratio 1.1, also representing progression since previous study. 2. Patchy areas of airspace consolidation in the lungs, mostly in the periphery, progressing since prior study. This likely represents progressive pulmonary infarcts but infectious or inflammatory process could also have this appearance. Follow-up after resolution of acute process recommended to exclude underlying pulmonary nodules. Critical Value/emergent results were called by telephone at the time of interpretation on 05/08/2024 at 9:43 pm to provider WHITNEY PLUNKETT , who verbally acknowledged these results. Electronically Signed  By: Elsie Gravely M.D.   On: 05/08/2024 21:56   DG Chest 2 View Result Date: 05/08/2024 CLINICAL DATA:  Chest pain.  Left rib pain. EXAM: CHEST - 2 VIEW COMPARISON:  09/24/2021 FINDINGS: Heart size and pulmonary vascularity are normal. Linear scarring in the lung bases. No pleural effusion or pneumothorax. No consolidation or airspace disease in the lungs. Mediastinal contours appear intact. Visualized ribs are nondisplaced. IMPRESSION: Scarring in the lung bases. No evidence of  active pulmonary disease. Electronically Signed   By: Elsie Gravely M.D.   On: 05/08/2024 18:57     Procedures   Medications Ordered in the ED  lidocaine  (LIDODERM ) 5 % 1 patch (1 patch Transdermal Patch Applied 05/08/24 2145)  morphine  (PF) 2 MG/ML injection 1 mg (has no administration in time range)  potassium chloride  SA (KLOR-CON  M) CR tablet 40 mEq (40 mEq Oral Given 05/08/24 2027)  apixaban  (ELIQUIS ) tablet 5 mg (5 mg Oral Given 05/08/24 2145)  oxyCODONE -acetaminophen  (PERCOCET/ROXICET) 5-325 MG per tablet 1 tablet (1 tablet Oral Given 05/08/24 2145)  iohexol  (OMNIPAQUE ) 350 MG/ML injection 80 mL (80 mLs Intravenous Contrast Given 05/08/24 2125)    Medical Decision Making Amount and/or Complexity of Data Reviewed Labs: ordered. Radiology: ordered.  Risk Prescription drug management.   Lab Tests:  CBC: WBC 8.1, Hgb 11.7 BMP: Cr 1.15, K 2.9 Troponin: 25 > 27 Ddimer: 4.61 (Wells score 4.5) Quad Resp screen: Negative  EKG: T wave inversions seen on prior EKG.   Imaging Studies ordered:  CXR: Scarring in lung bases. No acute findings.  CTPE: Multiple bilateral pulmonary emboli with progression of clot burden since previous study. Evidence of right heart strain.   Medicines ordered:  Oxycodone -acetaminophen  5-325 x1 Morphine  1mg  x1  K 40 mEq   Plan [facility] 43 y.o. with hx of recurrent PE, DVTs presenting for pleuritic chest pain and SOB. Was seen in ED on 04/25/24 for similar symptoms with stable CTPE at the time. CTPE today demonstrating bilateral PR with progression of clot burden and evidence of R heart strain. Patient given pain control and home dose of nighttime Eliquis . Heparin  ordered per pharmacy. Called for unassigned admission. Hospitalist team to admit patient.   Final diagnoses:  Bilateral pulmonary embolism [facility])    ED Discharge Orders     None          Diona Perkins, MD 05/08/24 7744    Doretha Folks, MD 05/10/24 [phone]

## 2024-05-08 NOTE — ED Notes (Signed)
 Pt back from ct, in NAD at this time.

## 2024-05-08 NOTE — ED Notes (Signed)
 Patient transported to CT

## 2024-05-08 NOTE — H&P (Signed)
 History and Physical    Denise Santana FMW:[id number] DOB: 28-Sep-1980 DOA: 05/08/2024  PCP: Pcp, No   Patient coming from: Home   Chief Complaint: Pleuritic chest pain, DOE  HPI: Denise Santana is a 43 y.o. female with medical history significant for recurrent DVT and PE who presents with pleuritic left-sided chest pain.  Patient was admitted to the hospital on March 28, 2024 and found to have recurrent DVT and PE.  She had not been taking Eliquis  as prescribed at that time due to financial constraints, was treated with IV heparin  in the hospital, and discharged back on Eliquis .  Patient reports that she has been strictly adherent with her Eliquis  since the recent discharge and has not missed any doses.  She has chest pain with deep breath, cough, and certain movements, and also feels short of breath with activity.  She denies any fevers, chills, lightheadedness, or syncope.  ED Course: Upon arrival to the ED, patient is found to be afebrile and saturating mid 90s on room air with mild tachycardia and stable blood pressure.  Labs are most notable for potassium 2.9, creatinine 1.15, normal WBC, hemoglobin 11.7, troponin 25, and D-dimer 4.61.  CTA chest is concerning for bilateral pulmonary emboli with increased clot burden and increased RV/LV ratio which is now 1.1.  Patient was treated with Percocet, morphine , Eliquis , and oral potassium in the emergency department and pharmacy was consulted to assist with IV heparin .  Review of Systems:  All other systems reviewed and apart from HPI, are negative.  Past Medical History:  Diagnosis Date   DVT (deep venous thrombosis) (HCC)     Past Surgical History:  Procedure Laterality Date   APPENDECTOMY     KIDNEY SURGERY      Social History:   reports that she has been smoking cigarettes. She has never used smokeless tobacco. She reports that she does not drink alcohol and does not use drugs.  No Known Allergies  Family History  Problem Relation  Age of Onset   Hypertension Mother    Hyperlipidemia Mother    Diabetes Mother    Heart failure Mother      Prior to Admission medications   Medication Sig Start Date End Date Taking? Authorizing Provider  acetaminophen  (TYLENOL ) 325 MG tablet Take 2 tablets (650 mg total) by mouth every 6 (six) hours. 03/30/24  Yes Janna Ferrier, DO  APIXABAN  (ELIQUIS ) VTE STARTER PACK (10MG  AND 5MG ) Take as directed on package: start with two-5mg  tablets twice daily for 7 days. On day 8, switch to one-5mg  tablet twice daily. Patient taking differently: Take 5 mg by mouth in the morning and at bedtime. 03/30/24  Yes Janna Ferrier, DO    Physical Exam: Vitals:   05/08/24 2000 05/08/24 2030 05/08/24 2146 05/08/24 2231  BP: 118/70 116/74 119/77   Pulse: 95 92 87   Resp: (!) 27 (!) 29 18   Temp:    98 F (36.7 C)  TempSrc:    Oral  SpO2: 91% 93% 100%     Constitutional: NAD, no pallor or diaphoresis   Eyes: PERTLA, lids and conjunctivae normal ENMT: Mucous membranes are moist. Posterior pharynx clear of any exudate or lesions.   Neck: supple, no masses  Respiratory: no wheezing, no crackles. No accessory muscle use.  Cardiovascular: S1 & S2 heard, regular rate and rhythm. No JVD. Abdomen: No tenderness, soft. Bowel sounds active.  Musculoskeletal: no clubbing / cyanosis. No joint deformity upper and lower extremities. No phlegmasia.  Skin:  no significant rashes, lesions, ulcers. Warm, dry, well-perfused. Neurologic: CN 2-12 grossly intact. Moving all extremities. Alert and oriented.  Psychiatric: Calm. Cooperative.    Labs and Imaging on Admission: I have personally reviewed following labs and imaging studies  CBC: Recent Labs  Lab 05/08/24 1755  WBC 8.1  HGB 11.7*  HCT 34.3*  MCV 101.2*  PLT 169   Basic Metabolic Panel: Recent Labs  Lab 05/08/24 1755  NA 137  K 2.9*  CL 106  CO2 21*  GLUCOSE 100*  BUN 7  CREATININE 1.15*  CALCIUM 8.9   GFR: Estimated Creatinine  Clearance: 87.1 mL/min (A) (by C-G formula based on SCr of 1.15 mg/dL (H)). Liver Function Tests: No results for input(s): AST, ALT, ALKPHOS, BILITOT, PROT, ALBUMIN  in the last 168 hours. No results for input(s): LIPASE, AMYLASE in the last 168 hours. No results for input(s): AMMONIA in the last 168 hours. Coagulation Profile: No results for input(s): INR, PROTIME in the last 168 hours. Cardiac Enzymes: No results for input(s): CKTOTAL, CKMB, CKMBINDEX, TROPONINI in the last 168 hours. BNP (last 3 results) No results for input(s): PROBNP in the last 8760 hours. HbA1C: No results for input(s): HGBA1C in the last 72 hours. CBG: No results for input(s): GLUCAP in the last 168 hours. Lipid Profile: No results for input(s): CHOL, HDL, LDLCALC, TRIG, CHOLHDL, LDLDIRECT in the last 72 hours. Thyroid Function Tests: No results for input(s): TSH, T4TOTAL, FREET4, T3FREE, THYROIDAB in the last 72 hours. Anemia Panel: No results for input(s): VITAMINB12, FOLATE, FERRITIN, TIBC, IRON, RETICCTPCT in the last 72 hours. Urine analysis:    Component Value Date/Time   COLORURINE YELLOW 04/25/2024 0236   APPEARANCEUR CLEAR 04/25/2024 0236   LABSPEC 1.010 04/25/2024 0236   PHURINE 7.0 04/25/2024 0236   GLUCOSEU NEGATIVE 04/25/2024 0236   HGBUR MODERATE (A) 04/25/2024 0236   BILIRUBINUR NEGATIVE 04/25/2024 0236   KETONESUR NEGATIVE 04/25/2024 0236   PROTEINUR 30 (A) 04/25/2024 0236   NITRITE NEGATIVE 04/25/2024 0236   LEUKOCYTESUR SMALL (A) 04/25/2024 0236   Sepsis Labs: @LABRCNTIP (procalcitonin:4,lacticidven:4) ) Recent Results (from the past 240 hours)  Resp panel by RT-PCR (RSV, Flu A&B, Covid) Anterior Nasal Swab     Status: None   Collection Time: 05/08/24  7:13 PM   Specimen: Anterior Nasal Swab  Result Value Ref Range Status   SARS Coronavirus 2 by RT PCR NEGATIVE NEGATIVE Final   Influenza A by PCR NEGATIVE  NEGATIVE Final   Influenza B by PCR NEGATIVE NEGATIVE Final    Comment: (NOTE) The Xpert Xpress SARS-CoV-2/FLU/RSV plus assay is intended as an aid in the diagnosis of influenza from Nasopharyngeal swab specimens and should not be used as a sole basis for treatment. Nasal washings and aspirates are unacceptable for Xpert Xpress SARS-CoV-2/FLU/RSV testing.  Fact Sheet for Patients: BloggerCourse.com  Fact Sheet for Healthcare Providers: SeriousBroker.it  This test is not yet approved or cleared by the [place]  FDA and has been authorized for detection and/or diagnosis of SARS-CoV-2 by FDA under an Emergency Use Authorization (EUA). This EUA will remain in effect (meaning this test can be used) for the duration of the COVID-19 declaration under Section 564(b)(1) of the Act, 21 U.S.C. section 360bbb-3(b)(1), unless the authorization is terminated or revoked.     Resp Syncytial Virus by PCR NEGATIVE NEGATIVE Final    Comment: (NOTE) Fact Sheet for Patients: BloggerCourse.com  Fact Sheet for Healthcare Providers: SeriousBroker.it  This test is not yet approved or cleared by the [place]   FDA and has been authorized for detection and/or diagnosis of SARS-CoV-2 by FDA under an Emergency Use Authorization (EUA). This EUA will remain in effect (meaning this test can be used) for the duration of the COVID-19 declaration under Section 564(b)(1) of the Act, 21 U.S.C. section 360bbb-3(b)(1), unless the authorization is terminated or revoked.  Performed at [facility], [address], [place], [place] [address]      Radiological Exams on Admission: CT Angio Chest PE W and/or Wo Contrast Result Date: 05/08/2024 CLINICAL DATA:  Pulmonary embolus suspected with low to intermediate probability. Positive D-dimer. Chest pain. EXAM: CT ANGIOGRAPHY CHEST WITH CONTRAST TECHNIQUE:  Multidetector CT imaging of the chest was performed using the standard protocol during bolus administration of intravenous contrast. Multiplanar CT image reconstructions and MIPs were obtained to evaluate the vascular anatomy. RADIATION DOSE REDUCTION: This exam was performed according to the departmental dose-optimization program which includes automated exposure control, adjustment of the mA and/or kV according to patient size and/or use of iterative reconstruction technique. CONTRAST:  80mL OMNIPAQUE  IOHEXOL  350 MG/ML SOLN COMPARISON:  Chest radiograph 05/08/2024.  CT chest 04/25/2024 FINDINGS: Cardiovascular: Technically adequate study with good opacification of the central and segmental pulmonary arteries. Mild motion artifact. Multiple filling defects are demonstrated within the distal main pulmonary arteries with extension into bilateral upper and lower lobe segmental pulmonary arteries. Compared with the previous study, there is significant increase in the clot burden and in the number of pulmonary arteries involved. This is consistent with progression of pulmonary embolus. Mild cardiac enlargement. RV to LV ratio is increased at 1.1 suggesting evidence of right heart strain. This also has progressed since the prior study. Normal caliber thoracic aorta. Great vessel origins are patent. No aortic dissection. Mediastinum/Nodes: No enlarged mediastinal, hilar, or axillary lymph nodes. Thyroid gland, trachea, and esophagus demonstrate no significant findings. Lungs/Pleura: Patchy areas of airspace consolidation in the lungs, mostly peripheral. This is progressing since prior study and may indicate progressive pulmonary infarcts. Infectious or inflammatory process could also have this appearance. Follow-up after resolution of acute process to exclude underlying pulmonary nodules. Upper Abdomen: Surgical absence of the gallbladder. No acute abnormalities. Musculoskeletal: No acute bony abnormalities. Review of the  MIP images confirms the above findings. IMPRESSION: 1. Positive examination for multiple bilateral pulmonary emboli. There is progression of clot burden since previous study. CT evidence of right heart strain with RV to LV ratio 1.1, also representing progression since previous study. 2. Patchy areas of airspace consolidation in the lungs, mostly in the periphery, progressing since prior study. This likely represents progressive pulmonary infarcts but infectious or inflammatory process could also have this appearance. Follow-up after resolution of acute process recommended to exclude underlying pulmonary nodules. Critical Value/emergent results were called by telephone at the time of interpretation on 05/08/2024 at 9:43 pm to provider WHITNEY PLUNKETT , who verbally acknowledged these results. Electronically Signed   By: Elsie Gravely M.D.   On: 05/08/2024 21:56   DG Chest 2 View Result Date: 05/08/2024 CLINICAL DATA:  Chest pain.  Left rib pain. EXAM: CHEST - 2 VIEW COMPARISON:  09/24/2021 FINDINGS: Heart size and pulmonary vascularity are normal. Linear scarring in the lung bases. No pleural effusion or pneumothorax. No consolidation or airspace disease in the lungs. Mediastinal contours appear intact. Visualized ribs are nondisplaced. IMPRESSION: Scarring in the lung bases. No evidence of active pulmonary disease. Electronically Signed   By: Elsie Gravely M.D.   On: 05/08/2024 18:57    EKG: Independently  reviewed. Sinus rhythm, anterior T-wave inversions.   Assessment/Plan   1. Recurrent PE  - Bilateral PE with RV/LV 1.1 and suspected infarcts noted on CTA in ED  - She reports strict adherence with Eliquis  since the recent hospitalization  - Continue IV heparin  for now, continue cardiac monitoring, check echocardiogram   2. Hypokalemia  - Replacing    DVT prophylaxis: IV heparin   Code Status: Full  Level of Care: Level of care: Telemetry Cardiac Family Communication: None present   Disposition Plan:  Patient is from: home  Anticipated d/c is to: Home  Anticipated d/c date is: 05/10/24 Patient currently: Pending echocardiogram, transition to Lovenox  or oral anticoagulant  Consults called: None  Admission status: Inpatient     Evalene GORMAN Sprinkles, MD [facility]  05/08/2024, 11:39 PM

## 2024-05-08 NOTE — Progress Notes (Signed)
 PHARMACY - ANTICOAGULATION CONSULT NOTE  Pharmacy Consult for heparin   Indication: pulmonary embolus  No Known Allergies  Patient Measurements:    Vital Signs: Temp: 98 F (36.7 C) (08/23 2231) Temp Source: Oral (08/23 2231) BP: 119/77 (08/23 2146) Pulse Rate: 87 (08/23 2146)  Labs: Recent Labs    05/08/24 1755 05/08/24 1944  HGB 11.7*  --   HCT 34.3*  --   PLT 169  --   CREATININE 1.15*  --   TROPONINIHS 25* 27*    Estimated Creatinine Clearance: 87.1 mL/min (A) (by C-G formula based on SCr of 1.15 mg/dL (H)).   Medical History: Past Medical History:  Diagnosis Date   DVT (deep venous thrombosis) [facility])    Assessment: 43 y.o female on Eliquis  PTA, started on a Eliquis   VTE Starter Pack on 7/ 15/2025 for RLE DVT.  Pharmacy consulted for IV heparin  for PE.   ED notes indicate the patient reports that she has had multiple blood clots  since 2019.  Hgb 11.7, pltc 169.     Apixaban  5mg  dose given in ED tonight 8/23 @ 21:45   CT angio chest:  significant increase in clot burden.   Will start IV heparin  now.  No bolus.   Goal of Therapy:  Heparin  level 0.3-0.7 units/ml aPTT 66-102 seconds Monitor platelets by anticoagulation protocol: Yes   Plan:  Start IV heparin  drip at 1500 units/hr Check antiXa level om 6 hrs Daily aPTT,  HL , CBC    Levorn Gaskins, RPh Clinical Pharmacist  05/08/2024,10:41 PM

## 2024-05-08 NOTE — ED Triage Notes (Signed)
 Pt here from home with c/o left rib area pain states that whole left side hurts , pt also is asking for a work note upon discharge

## 2024-05-09 ENCOUNTER — Inpatient Hospital Stay (HOSPITAL_COMMUNITY)

## 2024-05-09 ENCOUNTER — Other Ambulatory Visit: Payer: Self-pay

## 2024-05-09 DIAGNOSIS — I2699 Other pulmonary embolism without acute cor pulmonale: Secondary | ICD-10-CM | POA: Diagnosis not present

## 2024-05-09 DIAGNOSIS — I2609 Other pulmonary embolism with acute cor pulmonale: Secondary | ICD-10-CM

## 2024-05-09 HISTORY — PX: IR ANGIOGRAM SELECTIVE EACH ADDITIONAL VESSEL: IMG667

## 2024-05-09 HISTORY — PX: IR ANGIOGRAM PULMONARY BILATERAL SELECTIVE: IMG664

## 2024-05-09 HISTORY — PX: IR INFUSION THROMBOL ARTERIAL INITIAL (MS): IMG5376

## 2024-05-09 HISTORY — PX: IR IVC FILTER PLMT / S&I /IMG GUID/MOD SED: IMG701

## 2024-05-09 HISTORY — PX: IR US GUIDE VASC ACCESS RIGHT: IMG2390

## 2024-05-09 LAB — BASIC METABOLIC PANEL WITH GFR
Anion gap: 11 (ref 5–15)
Anion gap: 10 (ref 5–15)
BUN: 6 mg/dL (ref 6–20)
BUN: 6 mg/dL (ref 6–20)
CO2: 21 mmol/L — ABNORMAL LOW (ref 22–32)
CO2: 20 mmol/L — ABNORMAL LOW (ref 22–32)
Calcium: 8.8 mg/dL — ABNORMAL LOW (ref 8.9–10.3)
Calcium: 8.4 mg/dL — ABNORMAL LOW (ref 8.9–10.3)
Chloride: 107 mmol/L (ref 98–111)
Chloride: 108 mmol/L (ref 98–111)
Creatinine, Ser: 1.08 mg/dL — ABNORMAL HIGH (ref 0.44–1.00)
Creatinine, Ser: 1.04 mg/dL — ABNORMAL HIGH (ref 0.44–1.00)
GFR, Estimated: >60 mL/min (ref >60)
GFR, Estimated: >60 mL/min (ref >60)
Glucose, Bld: 129 mg/dL — ABNORMAL HIGH (ref 70–99)
Glucose, Bld: 105 mg/dL — ABNORMAL HIGH (ref 70–99)
Potassium: 3.3 mmol/L — ABNORMAL LOW (ref 3.5–5.1)
Potassium: 3.4 mmol/L — ABNORMAL LOW (ref 3.5–5.1)
Sodium: 139 mmol/L (ref 135–145)
Sodium: 138 mmol/L (ref 135–145)

## 2024-05-09 LAB — ECHOCARDIOGRAM COMPLETE
AR max vel: 2.9 cm2
AV Area VTI: 2.91 cm2
AV Area mean vel: 2.95 cm2
AV Mean grad: 3.3 mmHg
AV Peak grad: 6.8 mmHg
Ao pk vel: 1.31 m/s
Area-P 1/2: 3.47 cm2
Height: 67 in
S' Lateral: 3.4 cm
Weight: 4232 [oz_av]

## 2024-05-09 LAB — FIBRINOGEN: Fibrinogen: 584 mg/dL — ABNORMAL HIGH (ref 210–475)

## 2024-05-09 LAB — CBC
HCT: 34 % — ABNORMAL LOW (ref 36.0–46.0)
HCT: 33.4 % — ABNORMAL LOW (ref 36.0–46.0)
Hemoglobin: 11.9 g/dL — ABNORMAL LOW (ref 12.0–15.0)
Hemoglobin: 11.4 g/dL — ABNORMAL LOW (ref 12.0–15.0)
MCH: 34.7 pg — ABNORMAL HIGH (ref 26.0–34.0)
MCH: 34.3 pg — ABNORMAL HIGH (ref 26.0–34.0)
MCHC: 35 g/dL (ref 30.0–36.0)
MCHC: 34.1 g/dL (ref 30.0–36.0)
MCV: 99.1 fL (ref 80.0–100.0)
MCV: 100.6 fL — ABNORMAL HIGH (ref 80.0–100.0)
Platelets: 154 K/uL (ref 150–400)
Platelets: 141 K/uL — ABNORMAL LOW (ref 150–400)
RBC: 3.43 MIL/uL — ABNORMAL LOW (ref 3.87–5.11)
RBC: 3.32 MIL/uL — ABNORMAL LOW (ref 3.87–5.11)
RDW: 13.7 % (ref 11.5–15.5)
RDW: 13.7 % (ref 11.5–15.5)
WBC: 7.8 K/uL (ref 4.0–10.5)
WBC: 8 K/uL (ref 4.0–10.5)
nRBC: 0 % (ref 0.0–0.2)
nRBC: 0 % (ref 0.0–0.2)

## 2024-05-09 LAB — FOLATE: Folate: 6.4 ng/mL (ref >5.9)

## 2024-05-09 LAB — VITAMIN B12: Vitamin B-12: 77 pg/mL — ABNORMAL LOW (ref 180–914)

## 2024-05-09 LAB — MAGNESIUM: Magnesium: 1.8 mg/dL (ref 1.7–2.4)

## 2024-05-09 LAB — HEPARIN LEVEL (UNFRACTIONATED)
Heparin Unfractionated: >1.1 [IU]/mL — ABNORMAL HIGH (ref 0.30–0.70)
Heparin Unfractionated: 0.9 [IU]/mL — ABNORMAL HIGH (ref 0.30–0.70)

## 2024-05-09 LAB — APTT
aPTT: 73 s — ABNORMAL HIGH (ref 24–36)
aPTT: 106 s — ABNORMAL HIGH (ref 24–36)

## 2024-05-09 LAB — GLUCOSE, CAPILLARY: Glucose-Capillary: 107 mg/dL — ABNORMAL HIGH (ref 70–99)

## 2024-05-09 LAB — MRSA NEXT GEN BY PCR, NASAL: MRSA by PCR Next Gen: NOT DETECTED

## 2024-05-09 MED ORDER — MIDAZOLAM HCL 2 MG/2ML IJ SOLN
INTRAMUSCULAR | Status: AC
  Filled 2024-05-09: qty 2

## 2024-05-09 MED ORDER — SODIUM CHLORIDE 0.9% FLUSH: 3.0000 mL | INTRAVENOUS | Status: DC | PRN

## 2024-05-09 MED ORDER — HYDROMORPHONE HCL 1 MG/ML IJ SOLN
1.0000 mg | INTRAMUSCULAR | Status: DC | PRN
  Administered 2024-05-09 – 2024-05-10 (×3): 1 mg via INTRAVENOUS
  Filled 2024-05-09 (×3): qty 1

## 2024-05-09 MED ORDER — IOHEXOL 300 MG/ML  SOLN
100.0000 mL | Freq: Once | INTRAMUSCULAR | Status: AC | PRN
  Administered 2024-05-09: 40 mL via INTRAVENOUS

## 2024-05-09 MED ORDER — POTASSIUM CHLORIDE CRYS ER 20 MEQ PO TBCR
40.0000 meq | EXTENDED_RELEASE_TABLET | Freq: Once | ORAL | Status: AC
  Administered 2024-05-09: 40 meq via ORAL
  Filled 2024-05-09: qty 2

## 2024-05-09 MED ORDER — CHLORHEXIDINE GLUCONATE CLOTH 2 % EX PADS
6.0000 | MEDICATED_PAD | Freq: Every day | CUTANEOUS | Status: DC
  Administered 2024-05-09 – 2024-05-10 (×2): 6 via TOPICAL

## 2024-05-09 MED ORDER — LIDOCAINE HCL 1 % IJ SOLN
INTRAMUSCULAR | Status: AC
  Filled 2024-05-09: qty 20

## 2024-05-09 MED ORDER — SODIUM CHLORIDE 0.9 % IV SOLN: 250.0000 mL | INTRAVENOUS | Status: AC | PRN

## 2024-05-09 MED ORDER — SODIUM CHLORIDE 0.9% FLUSH
3.0000 mL | Freq: Two times a day (BID) | INTRAVENOUS | Status: DC
  Administered 2024-05-09 – 2024-05-10 (×4): 3 mL via INTRAVENOUS

## 2024-05-09 MED ORDER — FENTANYL CITRATE (PF) 100 MCG/2ML IJ SOLN
INTRAMUSCULAR | Status: AC
Start: 2024-05-09 — End: 2024-05-09
  Filled 2024-05-09: qty 2

## 2024-05-09 MED ORDER — MIDAZOLAM HCL 2 MG/2ML IJ SOLN
INTRAMUSCULAR | Status: AC | PRN
Start: 2024-05-09 — End: 2024-05-09
  Administered 2024-05-09 (×4): 1 mg via INTRAVENOUS

## 2024-05-09 MED ORDER — FUROSEMIDE 10 MG/ML IJ SOLN
20.0000 mg | Freq: Once | INTRAMUSCULAR | Status: AC
  Administered 2024-05-09: 20 mg via INTRAVENOUS
  Filled 2024-05-09: qty 2

## 2024-05-09 MED ORDER — SODIUM CHLORIDE 0.9 % IV SOLN
12.0000 mg | Freq: Once | INTRAVENOUS | Status: AC
  Administered 2024-05-09: 12 mg via INTRAVENOUS
  Filled 2024-05-09: qty 12

## 2024-05-09 MED ORDER — FENTANYL CITRATE (PF) 100 MCG/2ML IJ SOLN
INTRAMUSCULAR | Status: AC
  Filled 2024-05-09: qty 2

## 2024-05-09 MED ORDER — SODIUM CHLORIDE 0.9 % IV SOLN: INTRAVENOUS | Status: DC

## 2024-05-09 MED ORDER — IOHEXOL 300 MG/ML  SOLN
100.0000 mL | Freq: Once | INTRAMUSCULAR | Status: AC | PRN
  Administered 2024-05-09: 50 mL via INTRA_ARTERIAL

## 2024-05-09 MED ORDER — LIDOCAINE HCL (PF) 1 % IJ SOLN
20.0000 mL | Freq: Once | INTRAMUSCULAR | Status: AC
  Administered 2024-05-09: 20 mL

## 2024-05-09 MED ORDER — FENTANYL CITRATE (PF) 100 MCG/2ML IJ SOLN
INTRAMUSCULAR | Status: AC | PRN
  Administered 2024-05-09: 25 ug via INTRAVENOUS
  Administered 2024-05-09 (×3): 50 ug via INTRAVENOUS
  Administered 2024-05-09: 25 ug via INTRAVENOUS

## 2024-05-09 NOTE — Progress Notes (Signed)
 Press photographer at bedside, SW services were offered. Patient declined and requested to leave.

## 2024-05-09 NOTE — Procedures (Signed)
  Procedure:  1. IVC filter placement 2. Pulmonary artery catheter directed thrombolysis bilateral, via R IJx2 Preprocedure diagnosis: The encounter diagnosis was Bilateral pulmonary embolism (HCC). Postprocedure diagnosis: same EBL:    minimal Complications:   none immediate  See full dictation in [employer].  CHARM Toribio Faes MD Main # [phone] Pager  [phone] Mobile [phone]

## 2024-05-09 NOTE — Progress Notes (Signed)
 Called to bedside by primary RN, Alfonse; patient wanting to leave AMA and has acute bilateral PE, receiving IV heparin  and currently with mild dyspnea at rest.  S/W patient directly and addressed her risk for leaving AMA d/t acute VTE. She is adamant that issues she has ongoing at home outweigh her risk. She has no assistance for her family at home and her daughter needs clothing for school starting tomorrow. Attempted to cajole her into staying for treatment; explained her health is more important and that if she dies, none of the issues she is concerned about with her daughter will matter. Also explained that insurance will not cover her stay if she leaves AMA. She understands that and remains adamant. She declines speaking with SW to offer any potential assistance for her issues also. She does agree to come back to the hospital ED as soon as she is done working out her issues at home.  Communicated to provider, Dr. 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  Included THEOPOLIS Garrel Mose for also.  Will continue to monitor situation/outcome.

## 2024-05-09 NOTE — Progress Notes (Signed)
 Patient is requesting to speak with the Doctor in charge of her care. She has things to take care of and needs to leave    Primary team informed.

## 2024-05-09 NOTE — Progress Notes (Addendum)
   05/09/24 0731  Vitals  Pulse Rate 93  ECG Heart Rate 94  Level of Consciousness  Level of Consciousness Alert  Oxygen Therapy  SpO2 94 %  O2 Device Nasal Cannula  MEWS Score  MEWS Temp 0  MEWS Systolic 0  MEWS Pulse 0  MEWS RR 0  MEWS LOC 0  MEWS Score 0  MEWS Score Color Green     SOB, noted during bedside report. Patient endorses chest pain, similar to her baseline. See MAR for interventions.

## 2024-05-09 NOTE — Progress Notes (Signed)
 Patient is SOB, crying and visibly upset. She is on the phone with her family.    AMA form is signed.

## 2024-05-09 NOTE — Progress Notes (Signed)
 PHARMACY - ANTICOAGULATION CONSULT NOTE  Pharmacy Consult for heparin   Indication: pulmonary embolus  No Known Allergies  Patient Measurements: Height: 5' 7 (170.2 cm) Weight: 120 kg (264 lb 8 oz) IBW/kg (Calculated) : 61.6 HEPARIN  DW (KG): 89.9  Vital Signs: Temp: 97.7 F (36.5 C) (08/24 0830) Temp Source: Oral (08/24 0830) BP: 125/91 (08/24 0830) Pulse Rate: 100 (08/24 0940)  Labs: Recent Labs    05/08/24 1755 05/08/24 1944 05/09/24 0844  HGB 11.7*  --  11.9*  HCT 34.3*  --  34.0*  PLT 169  --  154  APTT  --   --  73*  HEPARINUNFRC  --   --  >1.10*  CREATININE 1.15*  --  1.08*  TROPONINIHS 25* 27*  --    Estimated Creatinine Clearance: 91.1 mL/min (A) (by C-G formula based on SCr of 1.08 mg/dL (H)).  Medical History: Past Medical History:  Diagnosis Date   DVT (deep venous thrombosis) (HCC)    Assessment: 43 y.o female presenting with PE. Pt was started on an Eliquis  Starter Pack 03/30/24 for RLE DVT. Per ED notes, pt reported has had multiple blood clots since 2019. Apixaban  5mg  dose was given in ED 8/23 @ 21:45 CT angio chest was significant increase in clot burden. Pharmacy consulted for IV heparin  for PE.   Heparin  infusion was started at 1500 units/h. Both HL and aPTT checked given recent DOAC exposure. Hgb and plt stable. No issues with heparin  infusion or bleeding documented.   8/24 AM HL and aPTT not correlating, therefore, will rely on aPTT for monitoring for now. The aPTT value was therapeutic at 73 seconds while on heparin  infusion of 1500 units/h.   Goal of Therapy:  Heparin  level 0.3-0.7 units/ml aPTT 66-102 seconds Monitor platelets by anticoagulation protocol: Yes   Plan:  Continue heparin  infusion at 1500 units/h.  Check 2nd aPTT/HL in 6h; continue to monitor aPTT and heparin  levels until levels correlate and can use heparin  level alone.  Continue to monitor CBC and for s/sx bleeding daily.   Maurilio Patten, PharmD PGY1 Pharmacy Resident [facility] 05/09/2024 9:50 AM

## 2024-05-09 NOTE — Progress Notes (Signed)
 PROGRESS NOTE    Denise Santana  FMW:[id number] DOB: [birth year] DOA: 05/08/2024 PCP: Pcp, No    Brief Narrative:   From admission h and p   Denise Santana is a 43 y.o. female with medical history significant for recurrent DVT and PE who presents with pleuritic left-sided chest pain.   Patient was admitted to the hospital on March 28, 2024 and found to have recurrent DVT and PE.  She had not been taking Eliquis  as prescribed at that time due to financial constraints, was treated with IV heparin  in the hospital, and discharged back on Eliquis .  Patient reports that she has been strictly adherent with her Eliquis  since the recent discharge and has not missed any doses.  She has chest pain with deep breath, cough, and certain movements, and also feels short of breath with activity.  She denies any fevers, chills, lightheadedness, or syncope.  Assessment & Plan:   Principal Problem:   Recurrent pulmonary emboli (HCC) Active Problems:   Hypokalemia   # Acute PE with treatment failure Here with hypoxia and pleuritic left sided chest pain. CTA shows multiple b/l PEs with progression of clot burden since last month. Has been compliant with apixaban  since last month's hospitalization for acute PE/DVT (previously had not). Patient's sister died of a PE, thus inherited hypercoagulable disorder is quite possible. CT shows evidence of right heart strain. Discussed with Dr. Tina of oncology who advises antiphospholipid eval. Doesn't recommend inherited thrombophilia panel as won't change mgmt and wouldn't explain oral treatment failure. Dr. Tina does advise switch to lovenox  at d/c (1 mg/kg bid) - continue IV heparin  - check PVL for DVT, would consult vascular if significant clot burden there - will reach out to IR about thrombectomy - ordering antiphospholipid syndrome panel - TTE  # Acute hypoxic respiratory failure 2/2 PE, possible CHF - TTE as above - continue O2 - heparin  as above - lasix  20  IV once  # Hypokalemia # Hypomagnesemia Improving - monitor and replete as needed  # Morbid obesity noted   DVT prophylaxis: IV heparin  Code Status: full Family Communication: none at bedside  Level of care: Telemetry Cardiac Status is: Inpatient Remains inpatient appropriate because: severity of illness    Consultants:  IR  Procedures: None thus far  Antimicrobials:  none    Subjective: Reports ongoing left sided pleuritic chest pain  Objective: Vitals:   05/09/24 0830 05/09/24 0843 05/09/24 0940 05/09/24 0946  BP: (!) 125/91     Pulse: 84 92 100 (!) 102  Resp: (!) 23     Temp: 97.7 F (36.5 C)     TempSrc: Oral     SpO2: 97% 96% 91% 90%  Weight:      Height:        Intake/Output Summary (Last 24 hours) at 05/09/2024 1037 Last data filed at 05/09/2024 0835 Gross per 24 hour  Intake 300.59 ml  Output --  Net 300.59 ml   Filed Weights   05/09/24 0000  Weight: 120 kg    Examination:  General exam: Appears calm and comfortable  Respiratory system: Clear to auscultation. Respiratory effort normal. Cardiovascular system: S1 & S2 heard, RRR.   Gastrointestinal system: Abdomen is nondistended, soft and nontender.   Central nervous system: Alert and oriented. No focal neurological deficits. Extremities: Symmetric 5 x 5 power. Pitting edema LEs bilaterally R > L Skin: No rashes, lesions or ulcers Psychiatry: Judgement and insight appear normal. Mood & affect appropriate.     Data  Reviewed: I have personally reviewed following labs and imaging studies  CBC: Recent Labs  Lab 05/08/24 1755 05/09/24 0844  WBC 8.1 7.8  HGB 11.7* 11.9*  HCT 34.3* 34.0*  MCV 101.2* 99.1  PLT 169 154   Basic Metabolic Panel: Recent Labs  Lab 05/08/24 1755 05/09/24 0844  NA 137 139  K 2.9* 3.3*  CL 106 107  CO2 21* 21*  GLUCOSE 100* 129*  BUN 7 6  CREATININE 1.15* 1.08*  CALCIUM 8.9 8.8*  MG  --  1.8   GFR: Estimated Creatinine Clearance: 91.1 mL/min  (A) (by C-G formula based on SCr of 1.08 mg/dL (H)). Liver Function Tests: No results for input(s): AST, ALT, ALKPHOS, BILITOT, PROT, ALBUMIN  in the last 168 hours. No results for input(s): LIPASE, AMYLASE in the last 168 hours. No results for input(s): AMMONIA in the last 168 hours. Coagulation Profile: No results for input(s): INR, PROTIME in the last 168 hours. Cardiac Enzymes: No results for input(s): CKTOTAL, CKMB, CKMBINDEX, TROPONINI in the last 168 hours. BNP (last 3 results) No results for input(s): PROBNP in the last 8760 hours. HbA1C: No results for input(s): HGBA1C in the last 72 hours. CBG: No results for input(s): GLUCAP in the last 168 hours. Lipid Profile: No results for input(s): CHOL, HDL, LDLCALC, TRIG, CHOLHDL, LDLDIRECT in the last 72 hours. Thyroid Function Tests: No results for input(s): TSH, T4TOTAL, FREET4, T3FREE, THYROIDAB in the last 72 hours. Anemia Panel: No results for input(s): VITAMINB12, FOLATE, FERRITIN, TIBC, IRON, RETICCTPCT in the last 72 hours. Urine analysis:    Component Value Date/Time   COLORURINE YELLOW 04/25/2024 0236   APPEARANCEUR CLEAR 04/25/2024 0236   LABSPEC 1.010 04/25/2024 0236   PHURINE 7.0 04/25/2024 0236   GLUCOSEU NEGATIVE 04/25/2024 0236   HGBUR MODERATE (A) 04/25/2024 0236   BILIRUBINUR NEGATIVE 04/25/2024 0236   KETONESUR NEGATIVE 04/25/2024 0236   PROTEINUR 30 (A) 04/25/2024 0236   NITRITE NEGATIVE 04/25/2024 0236   LEUKOCYTESUR SMALL (A) 04/25/2024 0236   Sepsis Labs: @LABRCNTIP (procalcitonin:4,lacticidven:4)  ) Recent Results (from the past 240 hours)  Resp panel by RT-PCR (RSV, Flu A&B, Covid) Anterior Nasal Swab     Status: None   Collection Time: 05/08/24  7:13 PM   Specimen: Anterior Nasal Swab  Result Value Ref Range Status   SARS Coronavirus 2 by RT PCR NEGATIVE NEGATIVE Final   Influenza A by PCR NEGATIVE NEGATIVE Final   Influenza  B by PCR NEGATIVE NEGATIVE Final    Comment: (NOTE) The Xpert Xpress SARS-CoV-2/FLU/RSV plus assay is intended as an aid in the diagnosis of influenza from Nasopharyngeal swab specimens and should not be used as a sole basis for treatment. Nasal washings and aspirates are unacceptable for Xpert Xpress SARS-CoV-2/FLU/RSV testing.  Fact Sheet for Patients: BloggerCourse.com  Fact Sheet for Healthcare Providers: SeriousBroker.it  This test is not yet approved or cleared by the [place]  FDA and has been authorized for detection and/or diagnosis of SARS-CoV-2 by FDA under an Emergency Use Authorization (EUA). This EUA will remain in effect (meaning this test can be used) for the duration of the COVID-19 declaration under Section 564(b)(1) of the Act, 21 U.S.C. section 360bbb-3(b)(1), unless the authorization is terminated or revoked.     Resp Syncytial Virus by PCR NEGATIVE NEGATIVE Final    Comment: (NOTE) Fact Sheet for Patients: BloggerCourse.com  Fact Sheet for Healthcare Providers: SeriousBroker.it  This test is not yet approved or cleared by the [place]  FDA and has been authorized for  detection and/or diagnosis of SARS-CoV-2 by FDA under an Emergency Use Authorization (EUA). This EUA will remain in effect (meaning this test can be used) for the duration of the COVID-19 declaration under Section 564(b)(1) of the Act, 21 U.S.C. section 360bbb-3(b)(1), unless the authorization is terminated or revoked.  Performed at [facility], [address] [place], [place] [address]          Radiology Studies: CT Angio Chest PE W and/or Wo Contrast Result Date: 05/08/2024 CLINICAL DATA:  Pulmonary embolus suspected with low to intermediate probability. Positive D-dimer. Chest pain. EXAM: CT ANGIOGRAPHY CHEST WITH CONTRAST TECHNIQUE: Multidetector CT imaging of the  chest was performed using the standard protocol during bolus administration of intravenous contrast. Multiplanar CT image reconstructions and MIPs were obtained to evaluate the vascular anatomy. RADIATION DOSE REDUCTION: This exam was performed according to the departmental dose-optimization program which includes automated exposure control, adjustment of the mA and/or kV according to patient size and/or use of iterative reconstruction technique. CONTRAST:  80mL OMNIPAQUE  IOHEXOL  350 MG/ML SOLN COMPARISON:  Chest radiograph 05/08/2024.  CT chest 04/25/2024 FINDINGS: Cardiovascular: Technically adequate study with good opacification of the central and segmental pulmonary arteries. Mild motion artifact. Multiple filling defects are demonstrated within the distal main pulmonary arteries with extension into bilateral upper and lower lobe segmental pulmonary arteries. Compared with the previous study, there is significant increase in the clot burden and in the number of pulmonary arteries involved. This is consistent with progression of pulmonary embolus. Mild cardiac enlargement. RV to LV ratio is increased at 1.1 suggesting evidence of right heart strain. This also has progressed since the prior study. Normal caliber thoracic aorta. Great vessel origins are patent. No aortic dissection. Mediastinum/Nodes: No enlarged mediastinal, hilar, or axillary lymph nodes. Thyroid gland, trachea, and esophagus demonstrate no significant findings. Lungs/Pleura: Patchy areas of airspace consolidation in the lungs, mostly peripheral. This is progressing since prior study and may indicate progressive pulmonary infarcts. Infectious or inflammatory process could also have this appearance. Follow-up after resolution of acute process to exclude underlying pulmonary nodules. Upper Abdomen: Surgical absence of the gallbladder. No acute abnormalities. Musculoskeletal: No acute bony abnormalities. Review of the MIP images confirms the above  findings. IMPRESSION: 1. Positive examination for multiple bilateral pulmonary emboli. There is progression of clot burden since previous study. CT evidence of right heart strain with RV to LV ratio 1.1, also representing progression since previous study. 2. Patchy areas of airspace consolidation in the lungs, mostly in the periphery, progressing since prior study. This likely represents progressive pulmonary infarcts but infectious or inflammatory process could also have this appearance. Follow-up after resolution of acute process recommended to exclude underlying pulmonary nodules. Critical Value/emergent results were called by telephone at the time of interpretation on 05/08/2024 at 9:43 pm to provider WHITNEY PLUNKETT , who verbally acknowledged these results. Electronically Signed   By: Elsie Gravely M.D.   On: 05/08/2024 21:56   DG Chest 2 View Result Date: 05/08/2024 CLINICAL DATA:  Chest pain.  Left rib pain. EXAM: CHEST - 2 VIEW COMPARISON:  09/24/2021 FINDINGS: Heart size and pulmonary vascularity are normal. Linear scarring in the lung bases. No pleural effusion or pneumothorax. No consolidation or airspace disease in the lungs. Mediastinal contours appear intact. Visualized ribs are nondisplaced. IMPRESSION: Scarring in the lung bases. No evidence of active pulmonary disease. Electronically Signed   By: Elsie Gravely M.D.   On: 05/08/2024 18:57        Scheduled Meds:  sodium chloride  flush  3 mL Intravenous Q12H   Continuous Infusions:  heparin  1,500 Units/hr (05/08/24 2337)     LOS: 1 day     Devaughn KATHEE Ban, MD [facility]   If 7PM-7AM, please contact night-coverage [email or web address] Password [email or web address] 05/09/2024, 10:37 AM

## 2024-05-09 NOTE — Consult Note (Signed)
 Chief Complaint: L chest pain  Referring Provider(s): Dr. Kandis  Supervising Physician: Johann Sieving  Patient Status: [facility] - In-pt  History of Present Illness: Denise Santana is a 43 y.o. female with PMH significant for recurrent DVT and PE. She was initially treated for PE and DVT 03/28/24, discharged on eliquis . She ended up returning to after financial barriers prevented her from taking the prescribed AC. Denise Santana was treated with IV heparin  and discharged back on eliquis  8/10 from ED.  She has been strictly adherent with AC since then. Despite this, she developed L sided pleuritic CP and SOB yesterday which led her to present to ED. CTA obtained: Positive examination for multiple bilateral pulmonary emboli. There is progression of clot burden since previous study. CT evidence of right heart strain with RV to LV ratio 1.1, also representing progression since previous study.  She was started on IV heparin  and admitted. IR consulted for consideration for intervention.   Last oral intake was breakfast at 8am.   Does not wear CPAP or use supplemental home O2. She has required 1-2L [place] while inpatient.   Denies fever, chills, sore throat, N/V, abd pain, blood in stool or urine, abnormal bruising. Endorses L sided pleuritic chest pain, shob, leg swelling.   Allergies Reviewed:  Patient has no known allergies.   Patient is Full Code  Past Medical History:  Diagnosis Date   DVT (deep venous thrombosis) (HCC)     Past Surgical History:  Procedure Laterality Date   APPENDECTOMY     KIDNEY SURGERY        Medications: Prior to Admission medications   Medication Sig Start Date End Date Taking? Authorizing Provider  acetaminophen  (TYLENOL ) 325 MG tablet Take 2 tablets (650 mg total) by mouth every 6 (six) hours. 03/30/24  Yes Janna Ferrier, DO  APIXABAN  (ELIQUIS ) VTE STARTER PACK (10MG  AND 5MG ) Take as directed on package: start with two-5mg  tablets twice daily for 7 days. On  day 8, switch to one-5mg  tablet twice daily. Patient taking differently: Take 5 mg by mouth in the morning and at bedtime. 03/30/24  Yes Janna Ferrier, DO     Family History  Problem Relation Age of Onset   Hypertension Mother    Hyperlipidemia Mother    Diabetes Mother    Heart failure Mother     Social History   Socioeconomic History   Marital status: Single    Spouse name: Not on file   Number of children: Not on file   Years of education: Not on file   Highest education level: Not on file  Occupational History   Not on file  Tobacco Use   Smoking status: Every Day    Types: Cigarettes   Smokeless tobacco: Never  Vaping Use   Vaping status: Never Used  Substance and Sexual Activity   Alcohol use: Never   Drug use: Never   Sexual activity: Not on file  Other Topics Concern   Not on file  Social History Narrative   Not on file   Social Drivers of Health   Financial Resource Strain: Not on file  Food Insecurity: No Food Insecurity (05/09/2024)   Hunger Vital Sign    Worried About Running Out of Food in the Last Year: Never true    Ran Out of Food in the Last Year: Never true  Transportation Needs: No Transportation Needs (05/09/2024)   PRAPARE - Transportation    Lack of Transportation (Medical): No    Lack  of Transportation (Non-Medical): No  Physical Activity: Not on file  Stress: Not on file  Social Connections: Not on file     Review of Systems: A 12 point ROS discussed and pertinent positives are indicated in the HPI above.  All other systems are negative.   Vital Signs: BP 110/77 (BP Location: Right Arm)   Pulse 90   Temp 98.6 F (37 C) (Oral)   Resp (!) 23   Ht 5' 7 (1.702 m)   Wt 264 lb 8 oz (120 kg)   SpO2 95%   BMI 41.43 kg/m     Physical Exam HENT:     Mouth/Throat:     Mouth: Mucous membranes are moist.     Pharynx: Oropharynx is clear.  Cardiovascular:     Rate and Rhythm: Normal rate and regular rhythm.     Pulses:           Radial pulses are 2+ on the right side and 2+ on the left side.     Heart sounds: Normal heart sounds.  Pulmonary:     Comments: Increased WOB, shallow respirations, diminished  bilat, wearing [place] Abdominal:     Palpations: Abdomen is soft.     Tenderness: There is no abdominal tenderness.  Musculoskeletal:     Right lower leg: No tenderness. Edema present.     Left lower leg: No tenderness. Edema present.  Skin:    General: Skin is warm and dry.  Neurological:     Mental Status: She is alert and oriented to person, place, and time.  Psychiatric:        Mood and Affect: Mood normal.        Behavior: Behavior normal.        Thought Content: Thought content normal.        Judgment: Judgment normal.     Imaging: CT Angio Chest PE W and/or Wo Contrast Result Date: 05/08/2024 CLINICAL DATA:  Pulmonary embolus suspected with low to intermediate probability. Positive D-dimer. Chest pain. EXAM: CT ANGIOGRAPHY CHEST WITH CONTRAST TECHNIQUE: Multidetector CT imaging of the chest was performed using the standard protocol during bolus administration of intravenous contrast. Multiplanar CT image reconstructions and MIPs were obtained to evaluate the vascular anatomy. RADIATION DOSE REDUCTION: This exam was performed according to the departmental dose-optimization program which includes automated exposure control, adjustment of the mA and/or kV according to patient size and/or use of iterative reconstruction technique. CONTRAST:  80mL OMNIPAQUE  IOHEXOL  350 MG/ML SOLN COMPARISON:  Chest radiograph 05/08/2024.  CT chest 04/25/2024 FINDINGS: Cardiovascular: Technically adequate study with good opacification of the central and segmental pulmonary arteries. Mild motion artifact. Multiple filling defects are demonstrated within the distal main pulmonary arteries with extension into bilateral upper and lower lobe segmental pulmonary arteries. Compared with the previous study, there is significant increase in the  clot burden and in the number of pulmonary arteries involved. This is consistent with progression of pulmonary embolus. Mild cardiac enlargement. RV to LV ratio is increased at 1.1 suggesting evidence of right heart strain. This also has progressed since the prior study. Normal caliber thoracic aorta. Great vessel origins are patent. No aortic dissection. Mediastinum/Nodes: No enlarged mediastinal, hilar, or axillary lymph nodes. Thyroid gland, trachea, and esophagus demonstrate no significant findings. Lungs/Pleura: Patchy areas of airspace consolidation in the lungs, mostly peripheral. This is progressing since prior study and may indicate progressive pulmonary infarcts. Infectious or inflammatory process could also have this appearance. Follow-up after resolution of acute process to exclude  underlying pulmonary nodules. Upper Abdomen: Surgical absence of the gallbladder. No acute abnormalities. Musculoskeletal: No acute bony abnormalities. Review of the MIP images confirms the above findings. IMPRESSION: 1. Positive examination for multiple bilateral pulmonary emboli. There is progression of clot burden since previous study. CT evidence of right heart strain with RV to LV ratio 1.1, also representing progression since previous study. 2. Patchy areas of airspace consolidation in the lungs, mostly in the periphery, progressing since prior study. This likely represents progressive pulmonary infarcts but infectious or inflammatory process could also have this appearance. Follow-up after resolution of acute process recommended to exclude underlying pulmonary nodules. Critical Value/emergent results were called by telephone at the time of interpretation on 05/08/2024 at 9:43 pm to provider WHITNEY PLUNKETT , who verbally acknowledged these results. Electronically Signed   By: Elsie Gravely M.D.   On: 05/08/2024 21:56   DG Chest 2 View Result Date: 05/08/2024 CLINICAL DATA:  Chest pain.  Left rib pain. EXAM: CHEST  - 2 VIEW COMPARISON:  09/24/2021 FINDINGS: Heart size and pulmonary vascularity are normal. Linear scarring in the lung bases. No pleural effusion or pneumothorax. No consolidation or airspace disease in the lungs. Mediastinal contours appear intact. Visualized ribs are nondisplaced. IMPRESSION: Scarring in the lung bases. No evidence of active pulmonary disease. Electronically Signed   By: Elsie Gravely M.D.   On: 05/08/2024 18:57   CT Angio Chest PE W and/or Wo Contrast Result Date: 04/25/2024 CLINICAL DATA:  Recurrent pulmonary embolism, chest pain, dyspnea, dizziness EXAM: CT ANGIOGRAPHY CHEST WITH CONTRAST TECHNIQUE: Multidetector CT imaging of the chest was performed using the standard protocol during bolus administration of intravenous contrast. Multiplanar CT image reconstructions and MIPs were obtained to evaluate the vascular anatomy. RADIATION DOSE REDUCTION: This exam was performed according to the departmental dose-optimization program which includes automated exposure control, adjustment of the mA and/or kV according to patient size and/or use of iterative reconstruction technique. CONTRAST:  75mL OMNIPAQUE  IOHEXOL  350 MG/ML SOLN COMPARISON:  03/29/2024 FINDINGS: Cardiovascular: There is slightly suboptimal opacification of the pulmonary arterial tree, however, the examination is still diagnostic. Multiple intraluminal filling defects persist in keeping with chronic pulmonary embolism, slightly improved since prior examination in keeping partial clot lysis. No new filling defects are identified to suggest recurrent pulmonary embolism. Stable borderline enlargement of the central pulmonary arteries in keeping with changes of pulmonary arterial hypertension. Cardiac size is mildly enlarged, however, global cardiac size has decreased since prior examination and there is no further CT evidence of right heart strain. No pericardial effusion. The thoracic aorta is unremarkable. Mediastinum/Nodes: No  enlarged mediastinal, hilar, or axillary lymph nodes. Thyroid gland, trachea, and esophagus demonstrate no significant findings. Lungs/Pleura: Bibasilar atelectasis. No confluent pulmonary infiltrate. No pneumothorax or pleural effusion. No central obstructing lesion. Upper Abdomen: No acute abnormality. Musculoskeletal: No chest wall abnormality. No acute or significant osseous findings. Review of the MIP images confirms the above findings. IMPRESSION: 1. Slight interval improvement in chronic pulmonary embolism. No new filling defects are identified to suggest recurrent pulmonary embolism. 2. Stable borderline enlargement of the central pulmonary arteries in keeping with changes of pulmonary arterial hypertension. 3. Mild cardiomegaly persists. However, global cardiac size has decreased since prior examination and there is no further CT evidence of right heart strain. Electronically Signed   By: Dorethia Molt M.D.   On: 04/25/2024 03:54    Labs:  CBC: Recent Labs    03/30/24 0433 04/25/24 0225 05/08/24 1755 05/09/24 0844  WBC 5.4 10.5  8.1 7.8  HGB 11.2* 12.1 11.7* 11.9*  HCT 32.4* 35.0* 34.3* 34.0*  PLT 88* 164 169 154    COAGS: Recent Labs    03/29/24 2048 03/30/24 0433 05/09/24 0844  APTT 87* 92* 73*    BMP: Recent Labs    03/30/24 1139 04/25/24 0225 05/08/24 1755 05/09/24 0844  NA 138 141 137 139  K 3.5 3.5 2.9* 3.3*  CL 107 105 106 107  CO2 22 24 21* 21*  GLUCOSE 104* 102* 100* 129*  BUN 8 8 7 6   CALCIUM 8.4* 9.1 8.9 8.8*  CREATININE 1.24* 1.19* 1.15* 1.08*  GFRNONAA 56* 59* >60 >60    LIVER FUNCTION TESTS: Recent Labs    03/28/24 2256 04/25/24 0225  BILITOT 1.3* 1.3*  AST 15 12*  ALT 14 13  ALKPHOS 91 92  PROT 6.3* 6.7  ALBUMIN  3.6 3.9    TUMOR MARKERS: No results for input(s): AFPTM, CEA, CA199, CHROMGRNA in the last 8760 hours.  Assessment and Plan:  Case reviewed by Dr. Johann who recommends image guided PE lysis and IVC filter  placement.  No contraindications for procedure identified in ROS, physical exam. Patient ate at 8am which will be taken into consideration with sedation, but procedure is urgent. Placed NPO at this time.  Trops 25 >27 Labs reviewed and acceptable for procedure 8/23 CTA chest imaging available and reviewed Afebrile. 95% on 1.5L [place]. RR elevated.  Continue heparin .  PESI is low risk at 82 points. However, patient's clinical picture suggests would benefit from intervention.  She will need follow up with IR clinic in 3 mo for IVC filter management.    Risks and benefits of PE lysis discussed with the patient including, but not limited to bleeding, pulmonary hemorrhage, perforation or dissection of cardiovascular structures, vascular injury, stroke, contrast induced renal failure, and infection.   Risks and benefits discussed with the patient including, but not limited to bleeding, infection, contrast induced renal failure, filter fracture or migration which can lead to emergency surgery or even death, strut penetration with damage or irritation to adjacent structures and caval thrombosis.  All of the patient's questions were answered, patient is agreeable to proceed. Consent signed and in chart.   Thank you for allowing our service to participate in Denise Santana 's care.    Electronically Signed: Laymon Coast, NP   05/09/2024, 11:46 AM     I spent a total of 20 Minutes    in face to face in clinical consultation, greater than 50% of which was counseling/coordinating care for PE lysis and filter.    (A copy of this note was sent to the referring provider and the time of visit.)

## 2024-05-09 NOTE — Progress Notes (Signed)
 Echocardiogram 2D Echocardiogram has been performed.  Denise Santana 05/09/2024, 2:55 PM

## 2024-05-09 NOTE — Plan of Care (Signed)
 Paged by ICU RN for patient's potassium.  Called back discussed that this is not a family medicine patient and our inpatient service is not taking care of this patient.  Patient was admitted to our service in August but it has been over 30 days and therefore would not be a bounce back.  Instructed RN to reach out to the listed attending Dr.Wouk, as this patient is already admitted.

## 2024-05-09 NOTE — Progress Notes (Signed)
 PHARMACY - ANTICOAGULATION CONSULT NOTE  Pharmacy Consult for heparin   Indication: pulmonary embolus  No Known Allergies  Patient Measurements: Height: 5' 7 (170.2 cm) Weight: 120 kg (264 lb 8 oz) IBW/kg (Calculated) : 61.6 HEPARIN  DW (KG): 89.9  Vital Signs: Temp: 98.5 F (36.9 C) (08/24 1546) Temp Source: Oral (08/24 1546) BP: 119/86 (08/24 1900) Pulse Rate: 76 (08/24 1900)  Labs: Recent Labs    05/08/24 1755 05/08/24 1944 05/09/24 0844 05/09/24 1600 05/09/24 1632  HGB 11.7*  --  11.9* 11.4*  --   HCT 34.3*  --  34.0* 33.4*  --   PLT 169  --  154 141*  --   APTT  --   --  73* 106*  --   HEPARINUNFRC  --   --  >1.10*  --  0.90*  CREATININE 1.15*  --  1.08* 1.04*  --   TROPONINIHS 25* 27*  --   --   --    Estimated Creatinine Clearance: 94.6 mL/min (A) (by C-G formula based on SCr of 1.04 mg/dL (H)).  Medical History: Past Medical History:  Diagnosis Date   DVT (deep venous thrombosis) (HCC)    Assessment: 43 y.o female presenting with PE. Pt was started on an Eliquis  Starter Pack 03/30/24 for RLE DVT. Per ED notes, pt reported has had multiple blood clots since 2019. Apixaban  5mg  dose was given in ED 8/23 @ 21:45 CT angio chest was significant increase in clot burden. Pharmacy consulted for IV heparin  for PE.   8/24 evening: heparin  level and aptt not correlating - aPTT slightly above goal, no issues with bleeding per RN. Patient is now s/p thrombolysis.  Goal of Therapy:  Heparin  level 0.3-0.7 units/ml aPTT 66-102 seconds Monitor platelets by anticoagulation protocol: Yes   Plan:  Reduce heparin  infusion to 1350 units/h.  Check aPTT/HL in morning; continue to monitor aPTT and heparin  levels until levels correlate and can use heparin  level alone.  Continue to monitor CBC and for s/sx bleeding daily.   Koren Or, PharmD Clinical Pharmacist 05/09/2024 7:25 PM Please check AMION for all [facility] Pharm[facility] numbers

## 2024-05-09 NOTE — Progress Notes (Signed)
   05/09/24 0940  Assess: MEWS Score  Pulse Rate 100  ECG Heart Rate (!) 104  SpO2 91 %  O2 Device Nasal Cannula  O2 Flow Rate (L/min) 2 L/min  Assess: MEWS Score  MEWS Temp 0  MEWS Systolic 0  MEWS Pulse 1  MEWS RR 1  MEWS LOC 0  MEWS Score 2  MEWS Score Color Yellow  Assess: if the MEWS score is Yellow or Red  Were vital signs accurate and taken at a resting state? No, vital signs rechecked  Assess: SIRS CRITERIA  SIRS Temperature  0  SIRS Respirations  1  SIRS Pulse 1  SIRS WBC 0  SIRS Score Sum  2

## 2024-05-09 NOTE — Consult Note (Incomplete)
 [facility] Hematology and oncology consult note   Patient Care Team: Pcp, No as PCP - General   ASSESSMENT & PLAN:  43 y.o.female with past medical history of ***consulted for ***.  Assessment & Plan Recurrent pulmonary emboli (HCC)  Hypokalemia    Code Status ***  Goals of care ***  Discharge planning ***  All questions were answered. The patient knows to call the clinic with any problems, questions or concerns.  I spent a total of *** minutes including review of chart and various tests results, face-to-face time with the patient, discussions about results, plan of care and coordination of care plan with other providers and staff members.   Denise JAYSON Chihuahua, MD 05/09/2024 3:52 PM   CHIEF COMPLAINTS/PURPOSE OF ADMISSION ***  HISTORY OF PRESENTING ILLNESS:  Denise Santana 43 y.o. female consulted for ***. Patient is admitted for   Summary of oncologic history as follows: Oncology History   No history exists.    MEDICAL HISTORY:  Past Medical History:  Diagnosis Date   DVT (deep venous thrombosis) (HCC)     SURGICAL HISTORY: Past Surgical History:  Procedure Laterality Date   APPENDECTOMY     IR ANGIOGRAM PULMONARY BILATERAL SELECTIVE  05/09/2024   IR ANGIOGRAM SELECTIVE EACH ADDITIONAL VESSEL  05/09/2024   IR ANGIOGRAM SELECTIVE EACH ADDITIONAL VESSEL  05/09/2024   IR INFUSION THROMBOL ARTERIAL INITIAL (MS)  05/09/2024   IR INFUSION THROMBOL ARTERIAL INITIAL (MS)  05/09/2024   IR IVC FILTER PLMT / S&I /IMG GUID/MOD SED  05/09/2024   IR US  GUIDE VASC ACCESS RIGHT  05/09/2024   KIDNEY SURGERY      SOCIAL HISTORY: Social History   Socioeconomic History   Marital status: Single    Spouse name: Not on file   Number of children: Not on file   Years of education: Not on file   Highest education level: Not on file  Occupational History   Not on file  Tobacco Use   Smoking status: Every Day    Types: Cigarettes   Smokeless tobacco: Never   Vaping Use   Vaping status: Never Used  Substance and Sexual Activity   Alcohol use: Never   Drug use: Never   Sexual activity: Not on file  Other Topics Concern   Not on file  Social History Narrative   Not on file   Social Drivers of Health   Financial Resource Strain: Not on file  Food Insecurity: No Food Insecurity (05/09/2024)   Hunger Vital Sign    Worried About Running Out of Food in the Last Year: Never true    Ran Out of Food in the Last Year: Never true  Transportation Needs: No Transportation Needs (05/09/2024)   PRAPARE - Administrator, Civil Service (Medical): No    Lack of Transportation (Non-Medical): No  Physical Activity: Not on file  Stress: Not on file  Social Connections: Not on file  Intimate Partner Violence: Not At Risk (05/09/2024)   Humiliation, Afraid, Rape, and Kick questionnaire    Fear of Current or Ex-Partner: No    Emotionally Abused: No    Physically Abused: No    Sexually Abused: No    FAMILY HISTORY: Family History  Problem Relation Age of Onset   Hypertension Mother    Hyperlipidemia Mother    Diabetes Mother    Heart failure Mother     ALLERGIES:  has no known allergies.  MEDICATIONS:  Current Facility-Administered Medications  Medication Dose  Route Frequency Provider Last Rate Last Admin   0.9 %  sodium chloride  infusion  250 mL Intravenous PRN Johann Sieving, MD       NOREEN ON 05/10/2024] 0.9 %  sodium chloride  infusion   Intravenous Continuous Johann Sieving, MD       NOREEN ON 05/10/2024] 0.9 %  sodium chloride  infusion   Intravenous Continuous Johann Sieving, MD       acetaminophen  (TYLENOL ) tablet 650 mg  650 mg Oral Q6H PRN Opyd, Evalene RAMAN, MD       Or   acetaminophen  (TYLENOL ) suppository 650 mg  650 mg Rectal Q6H PRN Opyd, Evalene RAMAN, MD       alteplase  (tPA/ACTIVASE ) 12 mg in sodium chloride  0.9 % 238 mL  12 mg Intravenous Once Johann Sieving, MD   12 mg at 05/09/24 1528   alteplase  (tPA/ACTIVASE ) 12 mg  in sodium chloride  0.9 % 238 mL  12 mg Intravenous Once Johann Sieving, MD   12 mg at 05/09/24 1527   Chlorhexidine  Gluconate Cloth 2 % PADS 6 each  6 each Topical Daily Kandis Devaughn Sayres, MD   6 each at 05/09/24 1546   heparin  ADULT infusion 100 units/mL (25000 units/250mL)  1,500 Units/hr Intravenous Continuous Gretta Levorn SQUIBB, RPH 15 mL/hr at 05/09/24 1500 1,500 Units/hr at 05/09/24 1500   oxyCODONE  (Oxy IR/ROXICODONE ) immediate release tablet 5-10 mg  5-10 mg Oral Q4H PRN Opyd, Timothy S, MD   10 mg at 05/09/24 1537   polyethylene glycol (MIRALAX  / GLYCOLAX ) packet 17 g  17 g Oral Daily PRN Opyd, Evalene RAMAN, MD       prochlorperazine  (COMPAZINE ) injection 5 mg  5 mg Intravenous Q6H PRN Opyd, Evalene RAMAN, MD       sodium chloride  flush (NS) 0.9 % injection 3 mL  3 mL Intravenous Q12H Opyd, Evalene RAMAN, MD   3 mL at 05/09/24 0038   sodium chloride  flush (NS) 0.9 % injection 3 mL  3 mL Intravenous Q12H Johann Sieving, MD       sodium chloride  flush (NS) 0.9 % injection 3 mL  3 mL Intravenous PRN Johann Sieving, MD        REVIEW OF SYSTEMS:   Constitutional: Denies fevers, weight loss or abnormal night sweats Eyes: Denies visual change Ears, nose, mouth, throat, and face: Denies sore throat or enlarged tongue Respiratory: Denies cough, shortness of breath or wheezes Cardiovascular: Denies palpitation, chest discomfort or chest pain Gastrointestinal:  Denies nausea, vomiting, diarrhea, constipation, heartburn or abdominal pain GU: Denies any hesitancy, dysuria, frequency, hematuria Skin: Denies abnormal skin rashes Lymphatics: Denies new lymphadenopathy or mass Neurological: Denies numbness, tingling or new weaknesses All other systems were reviewed with the patient and are negative.  PHYSICAL EXAMINATION:  Vitals:   05/09/24 1500 05/09/24 1546  BP: 118/84 126/81  Pulse: 87 92  Resp: 18 (!) 24  Temp:  98.5 F (36.9 C)  SpO2: 96% 97%   Filed Weights   05/09/24 0000  Weight: 264 lb 8  oz (120 kg)    GENERAL:alert, no distress and comfortable SKIN: skin color normal. No jaundice EYES: normal, conjunctiva normal, sclera clear OROPHARYNX: no exudate, moist NECK: supple. No mass LYMPH:  no palpable cervical, axillary or inguinal lymphadenopathy LUNGS: clear to auscultation and normal breathing effort.  No wheeze or rales HEART: regular rate & rhythm and no murmurs ABDOMEN:abdomen soft, non-tender and normal bowel sounds Musculoskeletal:  no lower extremity edema NEURO: alert & oriented x 3 with fluent speech;  no focal motor/sensory deficits Strength and sensation equal bilaterally  LABORATORY DATA:  I have reviewed the data as listed Lab Results  Component Value Date   WBC 7.8 05/09/2024   HGB 11.9 (L) 05/09/2024   HCT 34.0 (L) 05/09/2024   MCV 99.1 05/09/2024   PLT 154 05/09/2024   Recent Labs    03/28/24 2256 03/30/24 0433 04/25/24 0225 05/08/24 1755 05/09/24 0844  NA 138   < > 141 137 139  K 3.5   < > 3.5 2.9* 3.3*  CL 105   < > 105 106 107  CO2 23   < > 24 21* 21*  GLUCOSE 107*   < > 102* 100* 129*  BUN 13   < > 8 7 6   CREATININE 1.46*   < > 1.19* 1.15* 1.08*  CALCIUM 8.8*   < > 9.1 8.9 8.8*  GFRNONAA 46*   < > 59* >60 >60  PROT 6.3*  --  6.7  --   --   ALBUMIN  3.6  --  3.9  --   --   AST 15  --  12*  --   --   ALT 14  --  13  --   --   ALKPHOS 91  --  92  --   --   BILITOT 1.3*  --  1.3*  --   --    < > = values in this interval not displayed.    RADIOGRAPHIC STUDIES: I have personally reviewed the radiological images as listed and agreed with the findings in the report. IR INFUSION THROMBOL ARTERIAL INITIAL (MS) Result Date: 05/09/2024 INDICATION: Recurrent sub massive pulmonary emboli, despite anticoagulation regimen. See consultation. Caval filtration to prevent further embolic activity, as well as pulmonary embolus thrombolysis is requested. EXAM: 1. ULTRASOUND GUIDANCE FOR VENOUS ACCESS X2 2. INFERIOR VENA CAVAGRAM 3. IVC FILTER  PLACEMENT (RETRIEVABLE) 4. PULMONARY ARTERIAL PRESSURE MEASUREMENT 5. FLUOROSCOPIC GUIDED PLACEMENT OF BILATERAL PULMONARY ARTERIAL LYTIC INFUSION CATHETERS COMPARISON:  CTA chest from overnight, and earlier studies MEDICATIONS: Intravenous Fentanyl  200mcg and Versed  4mg  were administered by RN during a total moderate (conscious) sedation time of 41 minutes; the patient's level of consciousness and physiological / cardiorespiratory status were monitored continuously by radiology RN under my direct supervision. CONTRAST:  50mL OMNIPAQUE  IOHEXOL  300 MG/ML SOLN, 40mL OMNIPAQUE  IOHEXOL  300 MG/ML SOLN51mL OMNIPAQUE  IOHEXOL  300 MG/ML SOLN, 40mL OMNIPAQUE  IOHEXOL  300 MG/ML SOLN72mL OMNIPAQUE  IOHEXOL  300 MG/ML SOLN, 40mL OMNIPAQUE  IOHEXOL  300 MG/ML SOLN FLUOROSCOPY TIME:  Radiation Exposure Index (as provided by the fluoroscopic device): 166 mGy air Kerma COMPLICATIONS: None immediate TECHNIQUE: Informed written consent was obtained from the patient after a discussion of the risks, benefits and alternatives to treatment. Questions regarding the procedure were encouraged and answered. A timeout was performed prior to the initiation of the procedure. Ultrasound interrogation of the right internal jugular vein showed no clot, and vessel was selected for vascular access. The right neck was prepped and draped in the usual sterile fashion, and a sterile drape was applied covering the operative field. Maximum barrier sterile technique with sterile gowns and gloves were used for the procedure. A timeout was performed prior to the initiation of the procedure. Local anesthesia was provided with 1% lidocaine . Under real-time ultrasound guidance, micropuncture set was utilized to access the right IJ vein, and a 6 [place] vascular sheath was placed. Under real-time ultrasound guidance cranial to the initial access, the right IJ vein was accessed with a 21 gauge micropuncture needle; the  needle tip within the vein was confirmed with  ultrasound image documentation. The needle was exchanged over a 018 guidewire for a transitional dilator, which allow advancement of the [facility] wire into the IVC. A long 6 French vascular sheath was placed for inferior venacavography. This demonstrated no caval thrombus. Renal vein inflows were evident. The retrievable Denali IVC filter was advanced through the sheath and successfully deployed under fluoroscopy at the L2 level. Followup cavagram demonstrates stable filter position and no evident complication. Sheath was exchanged for a short 9 [place] vascular sheath. With the use of a Rosen wire, an angled pigtail catheter was advanced into the left main pulmonary artery . Pressure measurements were then obtained from the left pulmonary artery. Over an exchange length Rosen wire, the pigtail catheter was exchanged for a 105/15 cm multi side-hole infusion catheter. With the use of a glidewire via the second sheath, the pigtail catheter was then advanced into the right main pulmonary artery . Over an exchange length Rosen wire, the pigtail catheter was exchanged for a 105/10 cm multi side-hole infusion catheter. A postprocedural fluoroscopic image was obtained of the check demonstrating final catheter positioning. Both vascular sheaths were secured at the right neck with 0 Prolene suture. The external catheter tubing was secured at the right chest and the lytic therapy was initiated. The patient tolerated the procedure well without immediate postprocedural complication. FINDINGS: IVC widely patent with single renal vein inflows at the L1-2 level. Technically successful IVC filter deployment. This is a retrievable model. Acquired pressure measurements: Left main pulmonary artery - 64/21(41)mmHg (normal: < 25/10) Following the procedure, both infusion catheter tips terminate within the distal aspects of the bilateral lower lobe sub segmental pulmonary arteries. IMPRESSION: 1. Normal  inferior vena cavagram. 2.  Successful IVC filter deployment.  This is a retrievable model. 3. Fluoroscopic guided initiation of bilateral catheter directed pulmonary arterial lysis for sub massive pulmonary embolism and right-sided heart strain. 4. Markedly elevated pressure measurements within the left main pulmonary artery compatible with critical pulmonary arterial hypertension. PLAN: -bedside pulmonary arterial pressure measurements in the morning after overnight thrombolytic infusion. -The IVC filter is potentially retrievable. The patient will be assessed for filter retrieval by Interventional Radiology in approximately 8-12 weeks. Further recommendations regarding filter retrieval, continued surveillance or declaration of device permanence, will be made at that time. Electronically Signed   By: JONETTA Faes M.D.   On: 05/09/2024 15:19   IR IVC FILTER PLMT / S&I PORTER GUID/MOD SED Result Date: 05/09/2024 INDICATION: Recurrent sub massive pulmonary emboli, despite anticoagulation regimen. See consultation. Caval filtration to prevent further embolic activity, as well as pulmonary embolus thrombolysis is requested. EXAM: 1. ULTRASOUND GUIDANCE FOR VENOUS ACCESS X2 2. INFERIOR VENA CAVAGRAM 3. IVC FILTER PLACEMENT (RETRIEVABLE) 4. PULMONARY ARTERIAL PRESSURE MEASUREMENT 5. FLUOROSCOPIC GUIDED PLACEMENT OF BILATERAL PULMONARY ARTERIAL LYTIC INFUSION CATHETERS COMPARISON:  CTA chest from overnight, and earlier studies MEDICATIONS: Intravenous Fentanyl  200mcg and Versed  4mg  were administered by RN during a total moderate (conscious) sedation time of 41 minutes; the patient's level of consciousness and physiological / cardiorespiratory status were monitored continuously by radiology RN under my direct supervision. CONTRAST:  50mL OMNIPAQUE  IOHEXOL  300 MG/ML SOLN, 40mL OMNIPAQUE  IOHEXOL  300 MG/ML SOLN29mL OMNIPAQUE  IOHEXOL  300 MG/ML SOLN, 40mL OMNIPAQUE  IOHEXOL  300 MG/ML SOLN56mL OMNIPAQUE  IOHEXOL  300 MG/ML SOLN, 40mL OMNIPAQUE  IOHEXOL  300  MG/ML SOLN FLUOROSCOPY TIME:  Radiation Exposure Index (as provided by the fluoroscopic device): 166 mGy air Kerma COMPLICATIONS: None immediate TECHNIQUE: Informed written consent was obtained  from the patient after a discussion of the risks, benefits and alternatives to treatment. Questions regarding the procedure were encouraged and answered. A timeout was performed prior to the initiation of the procedure. Ultrasound interrogation of the right internal jugular vein showed no clot, and vessel was selected for vascular access. The right neck was prepped and draped in the usual sterile fashion, and a sterile drape was applied covering the operative field. Maximum barrier sterile technique with sterile gowns and gloves were used for the procedure. A timeout was performed prior to the initiation of the procedure. Local anesthesia was provided with 1% lidocaine . Under real-time ultrasound guidance, micropuncture set was utilized to access the right IJ vein, and a 6 [place] vascular sheath was placed. Under real-time ultrasound guidance cranial to the initial access, the right IJ vein was accessed with a 21 gauge micropuncture needle; the needle tip within the vein was confirmed with ultrasound image documentation. The needle was exchanged over a 018 guidewire for a transitional dilator, which allow advancement of the [facility] wire into the IVC. A long 6 French vascular sheath was placed for inferior venacavography. This demonstrated no caval thrombus. Renal vein inflows were evident. The retrievable Denali IVC filter was advanced through the sheath and successfully deployed under fluoroscopy at the L2 level. Followup cavagram demonstrates stable filter position and no evident complication. Sheath was exchanged for a short 9 [place] vascular sheath. With the use of a Rosen wire, an angled pigtail catheter was advanced into the left main pulmonary artery . Pressure measurements were then obtained from the left pulmonary  artery. Over an exchange length Rosen wire, the pigtail catheter was exchanged for a 105/15 cm multi side-hole infusion catheter. With the use of a glidewire via the second sheath, the pigtail catheter was then advanced into the right main pulmonary artery . Over an exchange length Rosen wire, the pigtail catheter was exchanged for a 105/10 cm multi side-hole infusion catheter. A postprocedural fluoroscopic image was obtained of the check demonstrating final catheter positioning. Both vascular sheaths were secured at the right neck with 0 Prolene suture. The external catheter tubing was secured at the right chest and the lytic therapy was initiated. The patient tolerated the procedure well without immediate postprocedural complication. FINDINGS: IVC widely patent with single renal vein inflows at the L1-2 level. Technically successful IVC filter deployment. This is a retrievable model. Acquired pressure measurements: Left main pulmonary artery - 64/21(41)mmHg (normal: < 25/10) Following the procedure, both infusion catheter tips terminate within the distal aspects of the bilateral lower lobe sub segmental pulmonary arteries. IMPRESSION: 1. Normal  inferior vena cavagram. 2. Successful IVC filter deployment.  This is a retrievable model. 3. Fluoroscopic guided initiation of bilateral catheter directed pulmonary arterial lysis for sub massive pulmonary embolism and right-sided heart strain. 4. Markedly elevated pressure measurements within the left main pulmonary artery compatible with critical pulmonary arterial hypertension. PLAN: -bedside pulmonary arterial pressure measurements in the morning after overnight thrombolytic infusion. -The IVC filter is potentially retrievable. The patient will be assessed for filter retrieval by Interventional Radiology in approximately 8-12 weeks. Further recommendations regarding filter retrieval, continued surveillance or declaration of device permanence, will be made at that  time. Electronically Signed   By: JONETTA Faes M.D.   On: 05/09/2024 15:19   IR INFUSION THROMBOL ARTERIAL INITIAL (MS) Result Date: 05/09/2024 INDICATION: Recurrent sub massive pulmonary emboli, despite anticoagulation regimen. See consultation. Caval filtration to prevent further embolic activity, as well as pulmonary embolus thrombolysis is  requested. EXAM: 1. ULTRASOUND GUIDANCE FOR VENOUS ACCESS X2 2. INFERIOR VENA CAVAGRAM 3. IVC FILTER PLACEMENT (RETRIEVABLE) 4. PULMONARY ARTERIAL PRESSURE MEASUREMENT 5. FLUOROSCOPIC GUIDED PLACEMENT OF BILATERAL PULMONARY ARTERIAL LYTIC INFUSION CATHETERS COMPARISON:  CTA chest from overnight, and earlier studies MEDICATIONS: Intravenous Fentanyl  200mcg and Versed  4mg  were administered by RN during a total moderate (conscious) sedation time of 41 minutes; the patient's level of consciousness and physiological / cardiorespiratory status were monitored continuously by radiology RN under my direct supervision. CONTRAST:  50mL OMNIPAQUE  IOHEXOL  300 MG/ML SOLN, 40mL OMNIPAQUE  IOHEXOL  300 MG/ML SOLN67mL OMNIPAQUE  IOHEXOL  300 MG/ML SOLN, 40mL OMNIPAQUE  IOHEXOL  300 MG/ML SOLN89mL OMNIPAQUE  IOHEXOL  300 MG/ML SOLN, 40mL OMNIPAQUE  IOHEXOL  300 MG/ML SOLN FLUOROSCOPY TIME:  Radiation Exposure Index (as provided by the fluoroscopic device): 166 mGy air Kerma COMPLICATIONS: None immediate TECHNIQUE: Informed written consent was obtained from the patient after a discussion of the risks, benefits and alternatives to treatment. Questions regarding the procedure were encouraged and answered. A timeout was performed prior to the initiation of the procedure. Ultrasound interrogation of the right internal jugular vein showed no clot, and vessel was selected for vascular access. The right neck was prepped and draped in the usual sterile fashion, and a sterile drape was applied covering the operative field. Maximum barrier sterile technique with sterile gowns and gloves were used for the  procedure. A timeout was performed prior to the initiation of the procedure. Local anesthesia was provided with 1% lidocaine . Under real-time ultrasound guidance, micropuncture set was utilized to access the right IJ vein, and a 6 [place] vascular sheath was placed. Under real-time ultrasound guidance cranial to the initial access, the right IJ vein was accessed with a 21 gauge micropuncture needle; the needle tip within the vein was confirmed with ultrasound image documentation. The needle was exchanged over a 018 guidewire for a transitional dilator, which allow advancement of the [facility] wire into the IVC. A long 6 French vascular sheath was placed for inferior venacavography. This demonstrated no caval thrombus. Renal vein inflows were evident. The retrievable Denali IVC filter was advanced through the sheath and successfully deployed under fluoroscopy at the L2 level. Followup cavagram demonstrates stable filter position and no evident complication. Sheath was exchanged for a short 9 [place] vascular sheath. With the use of a Rosen wire, an angled pigtail catheter was advanced into the left main pulmonary artery . Pressure measurements were then obtained from the left pulmonary artery. Over an exchange length Rosen wire, the pigtail catheter was exchanged for a 105/15 cm multi side-hole infusion catheter. With the use of a glidewire via the second sheath, the pigtail catheter was then advanced into the right main pulmonary artery . Over an exchange length Rosen wire, the pigtail catheter was exchanged for a 105/10 cm multi side-hole infusion catheter. A postprocedural fluoroscopic image was obtained of the check demonstrating final catheter positioning. Both vascular sheaths were secured at the right neck with 0 Prolene suture. The external catheter tubing was secured at the right chest and the lytic therapy was initiated. The patient tolerated the procedure well without immediate postprocedural complication.  FINDINGS: IVC widely patent with single renal vein inflows at the L1-2 level. Technically successful IVC filter deployment. This is a retrievable model. Acquired pressure measurements: Left main pulmonary artery - 64/21(41)mmHg (normal: < 25/10) Following the procedure, both infusion catheter tips terminate within the distal aspects of the bilateral lower lobe sub segmental pulmonary arteries. IMPRESSION: 1. Normal  inferior vena cavagram. 2. Successful IVC filter deployment.  This is a retrievable model. 3. Fluoroscopic guided initiation of bilateral catheter directed pulmonary arterial lysis for sub massive pulmonary embolism and right-sided heart strain. 4. Markedly elevated pressure measurements within the left main pulmonary artery compatible with critical pulmonary arterial hypertension. PLAN: -bedside pulmonary arterial pressure measurements in the morning after overnight thrombolytic infusion. -The IVC filter is potentially retrievable. The patient will be assessed for filter retrieval by Interventional Radiology in approximately 8-12 weeks. Further recommendations regarding filter retrieval, continued surveillance or declaration of device permanence, will be made at that time. Electronically Signed   By: JONETTA Faes M.D.   On: 05/09/2024 15:19   IR US  Guide Vasc Access Right Result Date: 05/09/2024 INDICATION: Recurrent sub massive pulmonary emboli, despite anticoagulation regimen. See consultation. Caval filtration to prevent further embolic activity, as well as pulmonary embolus thrombolysis is requested. EXAM: 1. ULTRASOUND GUIDANCE FOR VENOUS ACCESS X2 2. INFERIOR VENA CAVAGRAM 3. IVC FILTER PLACEMENT (RETRIEVABLE) 4. PULMONARY ARTERIAL PRESSURE MEASUREMENT 5. FLUOROSCOPIC GUIDED PLACEMENT OF BILATERAL PULMONARY ARTERIAL LYTIC INFUSION CATHETERS COMPARISON:  CTA chest from overnight, and earlier studies MEDICATIONS: Intravenous Fentanyl  200mcg and Versed  4mg  were administered by RN during a total  moderate (conscious) sedation time of 41 minutes; the patient's level of consciousness and physiological / cardiorespiratory status were monitored continuously by radiology RN under my direct supervision. CONTRAST:  50mL OMNIPAQUE  IOHEXOL  300 MG/ML SOLN, 40mL OMNIPAQUE  IOHEXOL  300 MG/ML SOLN6mL OMNIPAQUE  IOHEXOL  300 MG/ML SOLN, 40mL OMNIPAQUE  IOHEXOL  300 MG/ML SOLN84mL OMNIPAQUE  IOHEXOL  300 MG/ML SOLN, 40mL OMNIPAQUE  IOHEXOL  300 MG/ML SOLN FLUOROSCOPY TIME:  Radiation Exposure Index (as provided by the fluoroscopic device): 166 mGy air Kerma COMPLICATIONS: None immediate TECHNIQUE: Informed written consent was obtained from the patient after a discussion of the risks, benefits and alternatives to treatment. Questions regarding the procedure were encouraged and answered. A timeout was performed prior to the initiation of the procedure. Ultrasound interrogation of the right internal jugular vein showed no clot, and vessel was selected for vascular access. The right neck was prepped and draped in the usual sterile fashion, and a sterile drape was applied covering the operative field. Maximum barrier sterile technique with sterile gowns and gloves were used for the procedure. A timeout was performed prior to the initiation of the procedure. Local anesthesia was provided with 1% lidocaine . Under real-time ultrasound guidance, micropuncture set was utilized to access the right IJ vein, and a 6 [place] vascular sheath was placed. Under real-time ultrasound guidance cranial to the initial access, the right IJ vein was accessed with a 21 gauge micropuncture needle; the needle tip within the vein was confirmed with ultrasound image documentation. The needle was exchanged over a 018 guidewire for a transitional dilator, which allow advancement of the [facility] wire into the IVC. A long 6 French vascular sheath was placed for inferior venacavography. This demonstrated no caval thrombus. Renal vein inflows were evident. The  retrievable Denali IVC filter was advanced through the sheath and successfully deployed under fluoroscopy at the L2 level. Followup cavagram demonstrates stable filter position and no evident complication. Sheath was exchanged for a short 9 [place] vascular sheath. With the use of a Rosen wire, an angled pigtail catheter was advanced into the left main pulmonary artery . Pressure measurements were then obtained from the left pulmonary artery. Over an exchange length Rosen wire, the pigtail catheter was exchanged for a 105/15 cm multi side-hole infusion catheter. With the use of a glidewire via the second sheath, the pigtail catheter was then advanced into the  right main pulmonary artery . Over an exchange length Rosen wire, the pigtail catheter was exchanged for a 105/10 cm multi side-hole infusion catheter. A postprocedural fluoroscopic image was obtained of the check demonstrating final catheter positioning. Both vascular sheaths were secured at the right neck with 0 Prolene suture. The external catheter tubing was secured at the right chest and the lytic therapy was initiated. The patient tolerated the procedure well without immediate postprocedural complication. FINDINGS: IVC widely patent with single renal vein inflows at the L1-2 level. Technically successful IVC filter deployment. This is a retrievable model. Acquired pressure measurements: Left main pulmonary artery - 64/21(41)mmHg (normal: < 25/10) Following the procedure, both infusion catheter tips terminate within the distal aspects of the bilateral lower lobe sub segmental pulmonary arteries. IMPRESSION: 1. Normal  inferior vena cavagram. 2. Successful IVC filter deployment.  This is a retrievable model. 3. Fluoroscopic guided initiation of bilateral catheter directed pulmonary arterial lysis for sub massive pulmonary embolism and right-sided heart strain. 4. Markedly elevated pressure measurements within the left main pulmonary artery compatible with  critical pulmonary arterial hypertension. PLAN: -bedside pulmonary arterial pressure measurements in the morning after overnight thrombolytic infusion. -The IVC filter is potentially retrievable. The patient will be assessed for filter retrieval by Interventional Radiology in approximately 8-12 weeks. Further recommendations regarding filter retrieval, continued surveillance or declaration of device permanence, will be made at that time. Electronically Signed   By: JONETTA Faes M.D.   On: 05/09/2024 15:19   IR Angiogram Pulmonary Bilateral Selective Result Date: 05/09/2024 INDICATION: Recurrent sub massive pulmonary emboli, despite anticoagulation regimen. See consultation. Caval filtration to prevent further embolic activity, as well as pulmonary embolus thrombolysis is requested. EXAM: 1. ULTRASOUND GUIDANCE FOR VENOUS ACCESS X2 2. INFERIOR VENA CAVAGRAM 3. IVC FILTER PLACEMENT (RETRIEVABLE) 4. PULMONARY ARTERIAL PRESSURE MEASUREMENT 5. FLUOROSCOPIC GUIDED PLACEMENT OF BILATERAL PULMONARY ARTERIAL LYTIC INFUSION CATHETERS COMPARISON:  CTA chest from overnight, and earlier studies MEDICATIONS: Intravenous Fentanyl  200mcg and Versed  4mg  were administered by RN during a total moderate (conscious) sedation time of 41 minutes; the patient's level of consciousness and physiological / cardiorespiratory status were monitored continuously by radiology RN under my direct supervision. CONTRAST:  50mL OMNIPAQUE  IOHEXOL  300 MG/ML SOLN, 40mL OMNIPAQUE  IOHEXOL  300 MG/ML SOLN8mL OMNIPAQUE  IOHEXOL  300 MG/ML SOLN, 40mL OMNIPAQUE  IOHEXOL  300 MG/ML SOLN15mL OMNIPAQUE  IOHEXOL  300 MG/ML SOLN, 40mL OMNIPAQUE  IOHEXOL  300 MG/ML SOLN FLUOROSCOPY TIME:  Radiation Exposure Index (as provided by the fluoroscopic device): 166 mGy air Kerma COMPLICATIONS: None immediate TECHNIQUE: Informed written consent was obtained from the patient after a discussion of the risks, benefits and alternatives to treatment. Questions regarding the procedure  were encouraged and answered. A timeout was performed prior to the initiation of the procedure. Ultrasound interrogation of the right internal jugular vein showed no clot, and vessel was selected for vascular access. The right neck was prepped and draped in the usual sterile fashion, and a sterile drape was applied covering the operative field. Maximum barrier sterile technique with sterile gowns and gloves were used for the procedure. A timeout was performed prior to the initiation of the procedure. Local anesthesia was provided with 1% lidocaine . Under real-time ultrasound guidance, micropuncture set was utilized to access the right IJ vein, and a 6 [place] vascular sheath was placed. Under real-time ultrasound guidance cranial to the initial access, the right IJ vein was accessed with a 21 gauge micropuncture needle; the needle tip within the vein was confirmed with ultrasound image documentation. The needle was exchanged over  a 018 guidewire for a transitional dilator, which allow advancement of the [facility] wire into the IVC. A long 6 French vascular sheath was placed for inferior venacavography. This demonstrated no caval thrombus. Renal vein inflows were evident. The retrievable Denali IVC filter was advanced through the sheath and successfully deployed under fluoroscopy at the L2 level. Followup cavagram demonstrates stable filter position and no evident complication. Sheath was exchanged for a short 9 [place] vascular sheath. With the use of a Rosen wire, an angled pigtail catheter was advanced into the left main pulmonary artery . Pressure measurements were then obtained from the left pulmonary artery. Over an exchange length Rosen wire, the pigtail catheter was exchanged for a 105/15 cm multi side-hole infusion catheter. With the use of a glidewire via the second sheath, the pigtail catheter was then advanced into the right main pulmonary artery . Over an exchange length Rosen wire, the pigtail catheter was  exchanged for a 105/10 cm multi side-hole infusion catheter. A postprocedural fluoroscopic image was obtained of the check demonstrating final catheter positioning. Both vascular sheaths were secured at the right neck with 0 Prolene suture. The external catheter tubing was secured at the right chest and the lytic therapy was initiated. The patient tolerated the procedure well without immediate postprocedural complication. FINDINGS: IVC widely patent with single renal vein inflows at the L1-2 level. Technically successful IVC filter deployment. This is a retrievable model. Acquired pressure measurements: Left main pulmonary artery - 64/21(41)mmHg (normal: < 25/10) Following the procedure, both infusion catheter tips terminate within the distal aspects of the bilateral lower lobe sub segmental pulmonary arteries. IMPRESSION: 1. Normal  inferior vena cavagram. 2. Successful IVC filter deployment.  This is a retrievable model. 3. Fluoroscopic guided initiation of bilateral catheter directed pulmonary arterial lysis for sub massive pulmonary embolism and right-sided heart strain. 4. Markedly elevated pressure measurements within the left main pulmonary artery compatible with critical pulmonary arterial hypertension. PLAN: -bedside pulmonary arterial pressure measurements in the morning after overnight thrombolytic infusion. -The IVC filter is potentially retrievable. The patient will be assessed for filter retrieval by Interventional Radiology in approximately 8-12 weeks. Further recommendations regarding filter retrieval, continued surveillance or declaration of device permanence, will be made at that time. Electronically Signed   By: JONETTA Faes M.D.   On: 05/09/2024 15:19   IR Angiogram Selective Each Additional Vessel Result Date: 05/09/2024 INDICATION: Recurrent sub massive pulmonary emboli, despite anticoagulation regimen. See consultation. Caval filtration to prevent further embolic activity, as well as  pulmonary embolus thrombolysis is requested. EXAM: 1. ULTRASOUND GUIDANCE FOR VENOUS ACCESS X2 2. INFERIOR VENA CAVAGRAM 3. IVC FILTER PLACEMENT (RETRIEVABLE) 4. PULMONARY ARTERIAL PRESSURE MEASUREMENT 5. FLUOROSCOPIC GUIDED PLACEMENT OF BILATERAL PULMONARY ARTERIAL LYTIC INFUSION CATHETERS COMPARISON:  CTA chest from overnight, and earlier studies MEDICATIONS: Intravenous Fentanyl  200mcg and Versed  4mg  were administered by RN during a total moderate (conscious) sedation time of 41 minutes; the patient's level of consciousness and physiological / cardiorespiratory status were monitored continuously by radiology RN under my direct supervision. CONTRAST:  50mL OMNIPAQUE  IOHEXOL  300 MG/ML SOLN, 40mL OMNIPAQUE  IOHEXOL  300 MG/ML SOLN51mL OMNIPAQUE  IOHEXOL  300 MG/ML SOLN, 40mL OMNIPAQUE  IOHEXOL  300 MG/ML SOLN65mL OMNIPAQUE  IOHEXOL  300 MG/ML SOLN, 40mL OMNIPAQUE  IOHEXOL  300 MG/ML SOLN FLUOROSCOPY TIME:  Radiation Exposure Index (as provided by the fluoroscopic device): 166 mGy air Kerma COMPLICATIONS: None immediate TECHNIQUE: Informed written consent was obtained from the patient after a discussion of the risks, benefits and alternatives to treatment. Questions regarding the procedure were encouraged  and answered. A timeout was performed prior to the initiation of the procedure. Ultrasound interrogation of the right internal jugular vein showed no clot, and vessel was selected for vascular access. The right neck was prepped and draped in the usual sterile fashion, and a sterile drape was applied covering the operative field. Maximum barrier sterile technique with sterile gowns and gloves were used for the procedure. A timeout was performed prior to the initiation of the procedure. Local anesthesia was provided with 1% lidocaine . Under real-time ultrasound guidance, micropuncture set was utilized to access the right IJ vein, and a 6 [place] vascular sheath was placed. Under real-time ultrasound guidance cranial to the  initial access, the right IJ vein was accessed with a 21 gauge micropuncture needle; the needle tip within the vein was confirmed with ultrasound image documentation. The needle was exchanged over a 018 guidewire for a transitional dilator, which allow advancement of the [facility] wire into the IVC. A long 6 French vascular sheath was placed for inferior venacavography. This demonstrated no caval thrombus. Renal vein inflows were evident. The retrievable Denali IVC filter was advanced through the sheath and successfully deployed under fluoroscopy at the L2 level. Followup cavagram demonstrates stable filter position and no evident complication. Sheath was exchanged for a short 9 [place] vascular sheath. With the use of a Rosen wire, an angled pigtail catheter was advanced into the left main pulmonary artery . Pressure measurements were then obtained from the left pulmonary artery. Over an exchange length Rosen wire, the pigtail catheter was exchanged for a 105/15 cm multi side-hole infusion catheter. With the use of a glidewire via the second sheath, the pigtail catheter was then advanced into the right main pulmonary artery . Over an exchange length Rosen wire, the pigtail catheter was exchanged for a 105/10 cm multi side-hole infusion catheter. A postprocedural fluoroscopic image was obtained of the check demonstrating final catheter positioning. Both vascular sheaths were secured at the right neck with 0 Prolene suture. The external catheter tubing was secured at the right chest and the lytic therapy was initiated. The patient tolerated the procedure well without immediate postprocedural complication. FINDINGS: IVC widely patent with single renal vein inflows at the L1-2 level. Technically successful IVC filter deployment. This is a retrievable model. Acquired pressure measurements: Left main pulmonary artery - 64/21(41)mmHg (normal: < 25/10) Following the procedure, both infusion catheter tips terminate within the  distal aspects of the bilateral lower lobe sub segmental pulmonary arteries. IMPRESSION: 1. Normal  inferior vena cavagram. 2. Successful IVC filter deployment.  This is a retrievable model. 3. Fluoroscopic guided initiation of bilateral catheter directed pulmonary arterial lysis for sub massive pulmonary embolism and right-sided heart strain. 4. Markedly elevated pressure measurements within the left main pulmonary artery compatible with critical pulmonary arterial hypertension. PLAN: -bedside pulmonary arterial pressure measurements in the morning after overnight thrombolytic infusion. -The IVC filter is potentially retrievable. The patient will be assessed for filter retrieval by Interventional Radiology in approximately 8-12 weeks. Further recommendations regarding filter retrieval, continued surveillance or declaration of device permanence, will be made at that time. Electronically Signed   By: JONETTA Faes M.D.   On: 05/09/2024 15:19   IR Angiogram Selective Each Additional Vessel Result Date: 05/09/2024 INDICATION: Recurrent sub massive pulmonary emboli, despite anticoagulation regimen. See consultation. Caval filtration to prevent further embolic activity, as well as pulmonary embolus thrombolysis is requested. EXAM: 1. ULTRASOUND GUIDANCE FOR VENOUS ACCESS X2 2. INFERIOR VENA CAVAGRAM 3. IVC FILTER PLACEMENT (RETRIEVABLE) 4. PULMONARY  ARTERIAL PRESSURE MEASUREMENT 5. FLUOROSCOPIC GUIDED PLACEMENT OF BILATERAL PULMONARY ARTERIAL LYTIC INFUSION CATHETERS COMPARISON:  CTA chest from overnight, and earlier studies MEDICATIONS: Intravenous Fentanyl  200mcg and Versed  4mg  were administered by RN during a total moderate (conscious) sedation time of 41 minutes; the patient's level of consciousness and physiological / cardiorespiratory status were monitored continuously by radiology RN under my direct supervision. CONTRAST:  50mL OMNIPAQUE  IOHEXOL  300 MG/ML SOLN, 40mL OMNIPAQUE  IOHEXOL  300 MG/ML SOLN66mL OMNIPAQUE   IOHEXOL  300 MG/ML SOLN, 40mL OMNIPAQUE  IOHEXOL  300 MG/ML SOLN53mL OMNIPAQUE  IOHEXOL  300 MG/ML SOLN, 40mL OMNIPAQUE  IOHEXOL  300 MG/ML SOLN FLUOROSCOPY TIME:  Radiation Exposure Index (as provided by the fluoroscopic device): 166 mGy air Kerma COMPLICATIONS: None immediate TECHNIQUE: Informed written consent was obtained from the patient after a discussion of the risks, benefits and alternatives to treatment. Questions regarding the procedure were encouraged and answered. A timeout was performed prior to the initiation of the procedure. Ultrasound interrogation of the right internal jugular vein showed no clot, and vessel was selected for vascular access. The right neck was prepped and draped in the usual sterile fashion, and a sterile drape was applied covering the operative field. Maximum barrier sterile technique with sterile gowns and gloves were used for the procedure. A timeout was performed prior to the initiation of the procedure. Local anesthesia was provided with 1% lidocaine . Under real-time ultrasound guidance, micropuncture set was utilized to access the right IJ vein, and a 6 [place] vascular sheath was placed. Under real-time ultrasound guidance cranial to the initial access, the right IJ vein was accessed with a 21 gauge micropuncture needle; the needle tip within the vein was confirmed with ultrasound image documentation. The needle was exchanged over a 018 guidewire for a transitional dilator, which allow advancement of the [facility] wire into the IVC. A long 6 French vascular sheath was placed for inferior venacavography. This demonstrated no caval thrombus. Renal vein inflows were evident. The retrievable Denali IVC filter was advanced through the sheath and successfully deployed under fluoroscopy at the L2 level. Followup cavagram demonstrates stable filter position and no evident complication. Sheath was exchanged for a short 9 [place] vascular sheath. With the use of a Rosen wire, an angled pigtail  catheter was advanced into the left main pulmonary artery . Pressure measurements were then obtained from the left pulmonary artery. Over an exchange length Rosen wire, the pigtail catheter was exchanged for a 105/15 cm multi side-hole infusion catheter. With the use of a glidewire via the second sheath, the pigtail catheter was then advanced into the right main pulmonary artery . Over an exchange length Rosen wire, the pigtail catheter was exchanged for a 105/10 cm multi side-hole infusion catheter. A postprocedural fluoroscopic image was obtained of the check demonstrating final catheter positioning. Both vascular sheaths were secured at the right neck with 0 Prolene suture. The external catheter tubing was secured at the right chest and the lytic therapy was initiated. The patient tolerated the procedure well without immediate postprocedural complication. FINDINGS: IVC widely patent with single renal vein inflows at the L1-2 level. Technically successful IVC filter deployment. This is a retrievable model. Acquired pressure measurements: Left main pulmonary artery - 64/21(41)mmHg (normal: < 25/10) Following the procedure, both infusion catheter tips terminate within the distal aspects of the bilateral lower lobe sub segmental pulmonary arteries. IMPRESSION: 1. Normal  inferior vena cavagram. 2. Successful IVC filter deployment.  This is a retrievable model. 3. Fluoroscopic guided initiation of bilateral catheter directed pulmonary arterial lysis for sub massive  pulmonary embolism and right-sided heart strain. 4. Markedly elevated pressure measurements within the left main pulmonary artery compatible with critical pulmonary arterial hypertension. PLAN: -bedside pulmonary arterial pressure measurements in the morning after overnight thrombolytic infusion. -The IVC filter is potentially retrievable. The patient will be assessed for filter retrieval by Interventional Radiology in approximately 8-12 weeks. Further  recommendations regarding filter retrieval, continued surveillance or declaration of device permanence, will be made at that time. Electronically Signed   By: JONETTA Faes M.D.   On: 05/09/2024 15:19   ECHOCARDIOGRAM COMPLETE Result Date: 05/09/2024    ECHOCARDIOGRAM REPORT   Patient Name:   Denise Santana Care Date of Exam: 05/09/2024 Medical Rec #:  [id number]    Height:       67.0 in Accession #:    [id number]   Weight:       264.5 lb Date of Birth:  June 20, 1981    BSA:          2.277 m Patient Age:    43 years     BP:           125/91 mmHg Patient Gender: F            HR:           80 bpm. Exam Location:  Inpatient Procedure: 2D Echo, Cardiac Doppler and Color Doppler (Both Spectral and Color            Flow Doppler were utilized during procedure). Indications:    Pulmonary Embolus I26.09  History:        Patient has prior history of Echocardiogram examinations, most                 recent 09/26/2021. Signs/Symptoms:Chest Pain.  Sonographer:    Thea Norlander RCS Referring Phys: [id number] TIMOTHY S OPYD IMPRESSIONS  1. Left ventricular ejection fraction, by estimation, is 50 to 55%. The left ventricle has low normal function. Left ventricular endocardial border not optimally defined to evaluate regional wall motion. Left ventricular diastolic parameters were normal.  2. Right ventricular systolic function is normal. The right ventricular size is normal.  3. The mitral valve is normal in structure. No evidence of mitral valve regurgitation. No evidence of mitral stenosis.  4. The aortic valve is tricuspid. Aortic valve regurgitation is not visualized. No aortic stenosis is present.  5. The inferior vena cava is normal in size with greater than 50% respiratory variability, suggesting right atrial pressure of 3 mmHg. FINDINGS  Left Ventricle: Left ventricular ejection fraction, by estimation, is 50 to 55%. The left ventricle has low normal function. Left ventricular endocardial border not optimally defined to evaluate  regional wall motion. The left ventricular internal cavity  size was normal in size. There is no left ventricular hypertrophy. Left ventricular diastolic parameters were normal. Right Ventricle: The right ventricular size is normal. Right vetricular wall thickness was not well visualized. Right ventricular systolic function is normal. Left Atrium: Left atrial size was normal in size. Right Atrium: Right atrial size was normal in size. Pericardium: There is no evidence of pericardial effusion. Mitral Valve: The mitral valve is normal in structure. No evidence of mitral valve regurgitation. No evidence of mitral valve stenosis. Tricuspid Valve: The tricuspid valve is normal in structure. Tricuspid valve regurgitation is trivial. No evidence of tricuspid stenosis. Aortic Valve: The aortic valve is tricuspid. Aortic valve regurgitation is not visualized. No aortic stenosis is present. Aortic valve mean gradient measures 3.3 mmHg. Aortic valve peak gradient measures 6.8 mmHg.  Aortic valve area, by VTI measures 2.91 cm. Pulmonic Valve: The pulmonic valve was not well visualized. Pulmonic valve regurgitation is mild. No evidence of pulmonic stenosis. Aorta: The aortic root and ascending aorta are structurally normal, with no evidence of dilitation. Venous: The inferior vena cava is normal in size with greater than 50% respiratory variability, suggesting right atrial pressure of 3 mmHg. IAS/Shunts: No atrial level shunt detected by color flow Doppler.  LEFT VENTRICLE PLAX 2D LVIDd:         4.60 cm   Diastology LVIDs:         3.40 cm   LV e' medial:    11.00 cm/s LV PW:         1.00 cm   LV E/e' medial:  4.7 LV IVS:        1.00 cm   LV e' lateral:   13.30 cm/s LVOT diam:     2.20 cm   LV E/e' lateral: 3.9 LV SV:         59 LV SV Index:   26 LVOT Area:     3.80 cm  RIGHT VENTRICLE             IVC RV S prime:     10.10 cm/s  IVC diam: 2.00 cm TAPSE (M-mode): 1.8 cm LEFT ATRIUM           Index        RIGHT ATRIUM            Index LA diam:      2.80 cm 1.23 cm/m   RA Area:     11.70 cm LA Vol (A2C): 24.9 ml 10.93 ml/m  RA Volume:   22.00 ml  9.66 ml/m LA Vol (A4C): 43.0 ml 18.88 ml/m  AORTIC VALVE AV Area (Vmax):    2.90 cm AV Area (Vmean):   2.95 cm AV Area (VTI):     2.91 cm AV Vmax:           130.68 cm/s AV Vmean:          82.202 cm/s AV VTI:            0.203 m AV Peak Grad:      6.8 mmHg AV Mean Grad:      3.3 mmHg LVOT Vmax:         99.70 cm/s LVOT Vmean:        63.900 cm/s LVOT VTI:          0.155 m LVOT/AV VTI ratio: 0.77  AORTA Ao Root diam: 3.20 cm Ao Asc diam:  3.50 cm MITRAL VALVE MV Area (PHT): 3.47 cm    SHUNTS MV Decel Time: 219 msec    Systemic VTI:  0.16 m MV E velocity: 51.85 cm/s  Systemic Diam: 2.20 cm MV A velocity: 46.30 cm/s MV E/A ratio:  1.12 Dorn Ross MD Electronically signed by Dorn Ross MD Signature Date/Time: 05/09/2024/1:03:15 PM    Final    CT Angio Chest PE W and/or Wo Contrast Result Date: 05/08/2024 CLINICAL DATA:  Pulmonary embolus suspected with low to intermediate probability. Positive D-dimer. Chest pain. EXAM: CT ANGIOGRAPHY CHEST WITH CONTRAST TECHNIQUE: Multidetector CT imaging of the chest was performed using the standard protocol during bolus administration of intravenous contrast. Multiplanar CT image reconstructions and MIPs were obtained to evaluate the vascular anatomy. RADIATION DOSE REDUCTION: This exam was performed according to the departmental dose-optimization program which includes automated exposure control, adjustment of the mA and/or kV according  to patient size and/or use of iterative reconstruction technique. CONTRAST:  80mL OMNIPAQUE  IOHEXOL  350 MG/ML SOLN COMPARISON:  Chest radiograph 05/08/2024.  CT chest 04/25/2024 FINDINGS: Cardiovascular: Technically adequate study with good opacification of the central and segmental pulmonary arteries. Mild motion artifact. Multiple filling defects are demonstrated within the distal main pulmonary arteries with  extension into bilateral upper and lower lobe segmental pulmonary arteries. Compared with the previous study, there is significant increase in the clot burden and in the number of pulmonary arteries involved. This is consistent with progression of pulmonary embolus. Mild cardiac enlargement. RV to LV ratio is increased at 1.1 suggesting evidence of right heart strain. This also has progressed since the prior study. Normal caliber thoracic aorta. Great vessel origins are patent. No aortic dissection. Mediastinum/Nodes: No enlarged mediastinal, hilar, or axillary lymph nodes. Thyroid gland, trachea, and esophagus demonstrate no significant findings. Lungs/Pleura: Patchy areas of airspace consolidation in the lungs, mostly peripheral. This is progressing since prior study and may indicate progressive pulmonary infarcts. Infectious or inflammatory process could also have this appearance. Follow-up after resolution of acute process to exclude underlying pulmonary nodules. Upper Abdomen: Surgical absence of the gallbladder. No acute abnormalities. Musculoskeletal: No acute bony abnormalities. Review of the MIP images confirms the above findings. IMPRESSION: 1. Positive examination for multiple bilateral pulmonary emboli. There is progression of clot burden since previous study. CT evidence of right heart strain with RV to LV ratio 1.1, also representing progression since previous study. 2. Patchy areas of airspace consolidation in the lungs, mostly in the periphery, progressing since prior study. This likely represents progressive pulmonary infarcts but infectious or inflammatory process could also have this appearance. Follow-up after resolution of acute process recommended to exclude underlying pulmonary nodules. Critical Value/emergent results were called by telephone at the time of interpretation on 05/08/2024 at 9:43 pm to provider WHITNEY PLUNKETT , who verbally acknowledged these results. Electronically Signed   By:  Elsie Gravely M.D.   On: 05/08/2024 21:56   DG Chest 2 View Result Date: 05/08/2024 CLINICAL DATA:  Chest pain.  Left rib pain. EXAM: CHEST - 2 VIEW COMPARISON:  09/24/2021 FINDINGS: Heart size and pulmonary vascularity are normal. Linear scarring in the lung bases. No pleural effusion or pneumothorax. No consolidation or airspace disease in the lungs. Mediastinal contours appear intact. Visualized ribs are nondisplaced. IMPRESSION: Scarring in the lung bases. No evidence of active pulmonary disease. Electronically Signed   By: Elsie Gravely M.D.   On: 05/08/2024 18:57   CT Angio Chest PE W and/or Wo Contrast Result Date: 04/25/2024 CLINICAL DATA:  Recurrent pulmonary embolism, chest pain, dyspnea, dizziness EXAM: CT ANGIOGRAPHY CHEST WITH CONTRAST TECHNIQUE: Multidetector CT imaging of the chest was performed using the standard protocol during bolus administration of intravenous contrast. Multiplanar CT image reconstructions and MIPs were obtained to evaluate the vascular anatomy. RADIATION DOSE REDUCTION: This exam was performed according to the departmental dose-optimization program which includes automated exposure control, adjustment of the mA and/or kV according to patient size and/or use of iterative reconstruction technique. CONTRAST:  75mL OMNIPAQUE  IOHEXOL  350 MG/ML SOLN COMPARISON:  03/29/2024 FINDINGS: Cardiovascular: There is slightly suboptimal opacification of the pulmonary arterial tree, however, the examination is still diagnostic. Multiple intraluminal filling defects persist in keeping with chronic pulmonary embolism, slightly improved since prior examination in keeping partial clot lysis. No new filling defects are identified to suggest recurrent pulmonary embolism. Stable borderline enlargement of the central pulmonary arteries in keeping with changes of pulmonary arterial hypertension.  Cardiac size is mildly enlarged, however, global cardiac size has decreased since prior  examination and there is no further CT evidence of right heart strain. No pericardial effusion. The thoracic aorta is unremarkable. Mediastinum/Nodes: No enlarged mediastinal, hilar, or axillary lymph nodes. Thyroid gland, trachea, and esophagus demonstrate no significant findings. Lungs/Pleura: Bibasilar atelectasis. No confluent pulmonary infiltrate. No pneumothorax or pleural effusion. No central obstructing lesion. Upper Abdomen: No acute abnormality. Musculoskeletal: No chest wall abnormality. No acute or significant osseous findings. Review of the MIP images confirms the above findings. IMPRESSION: 1. Slight interval improvement in chronic pulmonary embolism. No new filling defects are identified to suggest recurrent pulmonary embolism. 2. Stable borderline enlargement of the central pulmonary arteries in keeping with changes of pulmonary arterial hypertension. 3. Mild cardiomegaly persists. However, global cardiac size has decreased since prior examination and there is no further CT evidence of right heart strain. Electronically Signed   By: Dorethia Molt M.D.   On: 04/25/2024 03:54

## 2024-05-09 NOTE — Plan of Care (Signed)
  Problem: Education: Goal: Knowledge of General Education information will improve Description: Including pain rating scale, medication(s)/side effects and non-pharmacologic comfort measures Outcome: Progressing   Problem: Health Behavior/Discharge Planning: Goal: Ability to manage health-related needs will improve Outcome: Progressing   Problem: Clinical Measurements: Goal: Ability to maintain clinical measurements within normal limits will improve Outcome: Progressing Goal: Will remain free from infection Outcome: Progressing Goal: Diagnostic test results will improve Outcome: Progressing Goal: Cardiovascular complication will be avoided Outcome: Progressing   Problem: Activity: Goal: Risk for activity intolerance will decrease Outcome: Progressing   Problem: Nutrition: Goal: Adequate nutrition will be maintained Outcome: Progressing   Problem: Coping: Goal: Level of anxiety will decrease Outcome: Progressing   Problem: Pain Managment: Goal: General experience of comfort will improve and/or be controlled Outcome: Progressing   Problem: Safety: Goal: Ability to remain free from injury will improve Outcome: Progressing   Problem: Skin Integrity: Goal: Risk for impaired skin integrity will decrease Outcome: Progressing

## 2024-05-09 NOTE — Progress Notes (Signed)
Patient requesting AMA form

## 2024-05-10 ENCOUNTER — Inpatient Hospital Stay (HOSPITAL_COMMUNITY)

## 2024-05-10 ENCOUNTER — Encounter (HOSPITAL_COMMUNITY)

## 2024-05-10 DIAGNOSIS — Z86718 Personal history of other venous thrombosis and embolism: Secondary | ICD-10-CM

## 2024-05-10 DIAGNOSIS — E538 Deficiency of other specified B group vitamins: Secondary | ICD-10-CM | POA: Diagnosis not present

## 2024-05-10 DIAGNOSIS — I2699 Other pulmonary embolism without acute cor pulmonale: Secondary | ICD-10-CM | POA: Diagnosis not present

## 2024-05-10 DIAGNOSIS — Z7901 Long term (current) use of anticoagulants: Secondary | ICD-10-CM

## 2024-05-10 HISTORY — PX: IR THROMB F/U EVAL ART/VEN FINAL DAY (MS): IMG5379

## 2024-05-10 LAB — CBC
HCT: 31.7 % — ABNORMAL LOW (ref 36.0–46.0)
HCT: 32.5 % — ABNORMAL LOW (ref 36.0–46.0)
Hemoglobin: 10.6 g/dL — ABNORMAL LOW (ref 12.0–15.0)
Hemoglobin: 11.5 g/dL — ABNORMAL LOW (ref 12.0–15.0)
MCH: 33.9 pg (ref 26.0–34.0)
MCH: 34.4 pg — ABNORMAL HIGH (ref 26.0–34.0)
MCHC: 33.4 g/dL (ref 30.0–36.0)
MCHC: 35.4 g/dL (ref 30.0–36.0)
MCV: 101.3 fL — ABNORMAL HIGH (ref 80.0–100.0)
MCV: 97.3 fL (ref 80.0–100.0)
Platelets: 110 K/uL — ABNORMAL LOW (ref 150–400)
Platelets: 102 K/uL — ABNORMAL LOW (ref 150–400)
RBC: 3.13 MIL/uL — ABNORMAL LOW (ref 3.87–5.11)
RBC: 3.34 MIL/uL — ABNORMAL LOW (ref 3.87–5.11)
RDW: 13.8 % (ref 11.5–15.5)
RDW: 13.8 % (ref 11.5–15.5)
WBC: 6.8 K/uL (ref 4.0–10.5)
WBC: 6.5 K/uL (ref 4.0–10.5)
nRBC: 0 % (ref 0.0–0.2)
nRBC: 0 % (ref 0.0–0.2)

## 2024-05-10 LAB — BASIC METABOLIC PANEL WITH GFR
Anion gap: 6 (ref 5–15)
BUN: 5 mg/dL — ABNORMAL LOW (ref 6–20)
CO2: 22 mmol/L (ref 22–32)
Calcium: 8.4 mg/dL — ABNORMAL LOW (ref 8.9–10.3)
Chloride: 109 mmol/L (ref 98–111)
Creatinine, Ser: 1.19 mg/dL — ABNORMAL HIGH (ref 0.44–1.00)
GFR, Estimated: 59 mL/min — ABNORMAL LOW (ref >60)
Glucose, Bld: 86 mg/dL (ref 70–99)
Potassium: 3.6 mmol/L (ref 3.5–5.1)
Sodium: 137 mmol/L (ref 135–145)

## 2024-05-10 LAB — FIBRINOGEN
Fibrinogen: 558 mg/dL — ABNORMAL HIGH (ref 210–475)
Fibrinogen: 664 mg/dL — ABNORMAL HIGH (ref 210–475)

## 2024-05-10 LAB — HEPARIN LEVEL (UNFRACTIONATED)
Heparin Unfractionated: 0.72 [IU]/mL — ABNORMAL HIGH (ref 0.30–0.70)
Heparin Unfractionated: 0.74 [IU]/mL — ABNORMAL HIGH (ref 0.30–0.70)
Heparin Unfractionated: 0.26 [IU]/mL — ABNORMAL LOW (ref 0.30–0.70)

## 2024-05-10 LAB — APTT: aPTT: 116 s — ABNORMAL HIGH (ref 24–36)

## 2024-05-10 MED ORDER — POTASSIUM CHLORIDE CRYS ER 20 MEQ PO TBCR
40.0000 meq | EXTENDED_RELEASE_TABLET | Freq: Once | ORAL | Status: AC
  Administered 2024-05-10: 40 meq via ORAL
  Filled 2024-05-10: qty 2

## 2024-05-10 MED ORDER — CYANOCOBALAMIN 1000 MCG/ML IJ SOLN
1000.0000 ug | Freq: Every day | INTRAMUSCULAR | Status: DC
  Administered 2024-05-10: 1000 ug via INTRAMUSCULAR
  Filled 2024-05-10 (×2): qty 1

## 2024-05-10 MED ORDER — MAGNESIUM SULFATE 2 GM/50ML IV SOLN
2.0000 g | Freq: Once | INTRAVENOUS | Status: AC
  Administered 2024-05-10: 2 g via INTRAVENOUS
  Filled 2024-05-10: qty 50

## 2024-05-10 NOTE — Assessment & Plan Note (Addendum)
 Will start B12 injection while inpatient. Check antiparietal antibody and IF antibody.  If positive on either, will need long-term injection If negative, continue B12 1000 mcg daily on discharge

## 2024-05-10 NOTE — TOC CM/SW Note (Signed)
 Transition of Care [facility]) - Inpatient Brief Assessment   Patient Details  Name: Denise Santana MRN: [id number] Date of Birth: 1981/07/27  [facility] CM/SW Contact:    Sudie Erminio Deems, RN Phone Number: 05/10/2024, 4:28 PM   Clinical Narrative: Case Manager received a consult for PCP. Patient presented for pleuritic chest pain. Patient has Medicaid and no PCP. Case Manager submitted information to CMA to arrange for PCP for this patient. Appointment to be added to the AVS. Patient drove herself to the hospital and vehicle is in the parking lot. No further home needs identified at this time.     Transition of Care Asessment: Insurance and Status: Insurance coverage has been reviewed Patient has primary care physician: No Home environment has been reviewed: reviewed   Prior/Current Home Services: No current home services Social Drivers of Health Review: SDOH reviewed no interventions necessary Readmission risk has been reviewed: Yes Transition of care needs: no transition of care needs at this time

## 2024-05-10 NOTE — Progress Notes (Addendum)
 PHARMACY - ANTICOAGULATION CONSULT NOTE  Pharmacy Consult for heparin   Indication: pulmonary embolus  No Known Allergies  Patient Measurements: Height: 5' 7 (170.2 cm) Weight: 120 kg (264 lb 8 oz) IBW/kg (Calculated) : 61.6 HEPARIN  DW (KG): 89.9  Vital Signs: Temp: 99.2 F (37.3 C) (08/25 1944) Temp Source: Oral (08/25 1944) BP: 76/61 (08/25 1900) Pulse Rate: 96 (08/25 2000)  Labs: Recent Labs    05/08/24 1755 05/08/24 1944 05/09/24 0844 05/09/24 1600 05/09/24 1632 05/10/24 0213 05/10/24 0443 05/10/24 1222 05/10/24 2003  HGB 11.7*  --  11.9* 11.4*  --  10.6*  --  11.5*  --   HCT 34.3*  --  34.0* 33.4*  --  31.7*  --  32.5*  --   PLT 169  --  154 141*  --  110*  --  102*  --   APTT  --   --  73* 106*  --  116*  --   --   --   HEPARINUNFRC  --   --  >1.10*  --    < > 0.72*  --  0.74* 0.26*  CREATININE 1.15*  --  1.08* 1.04*  --   --  1.19*  --   --   TROPONINIHS 25* 27*  --   --   --   --   --   --   --    < > = values in this interval not displayed.   Estimated Creatinine Clearance: 82.6 mL/min (A) (by C-G formula based on SCr of 1.19 mg/dL (H)).  Medical History: Past Medical History:  Diagnosis Date   DVT (deep venous thrombosis) (HCC)    Assessment: 43 y.o female presenting with PE. Pt was started on an Eliquis  Starter Pack 03/30/24 for RLE DVT. Per ED notes, pt reported has had multiple blood clots since 2019. Apixaban  5mg  dose was given in ED 8/23 @ 21:45 CT angio chest was significant increase in clot burden. Pharmacy consulted for IV heparin  for PE.   Heparin  level now down to slightly subtherapeutic (0.24) on infusion at 1050 units/hr. No issues with line or bleeding reported per RN.  Goal of Therapy:  Heparin  level 0.3-0.7 units/mL Monitor platelets by anticoagulation protocol: Yes   Plan:  Increase heparin  infusion to 1150 units/h Recheck heparin  level in 6hr  Vito Ralph, PharmD, BCPS Please see amion for complete clinical pharmacist phone  list 05/10/2024  ADDENDUM ([id number]) Pt left AMA - would not accept Lovenox  or oral AC.  Vito Ralph, PharmD, BCPS Please see amion for complete clinical pharmacist phone list 05/10/2024 10:54 PM

## 2024-05-10 NOTE — Progress Notes (Signed)
 PHARMACY - ANTICOAGULATION CONSULT NOTE  Pharmacy Consult for heparin   Indication: pulmonary embolus  No Known Allergies  Patient Measurements: Height: 5' 7 (170.2 cm) Weight: 120 kg (264 lb 8 oz) IBW/kg (Calculated) : 61.6 HEPARIN  DW (KG): 89.9  Vital Signs: Temp: 98.2 F (36.8 C) (08/25 1217) Temp Source: Oral (08/25 1217) BP: 129/86 (08/25 1200) Pulse Rate: 76 (08/25 1217)  Labs: Recent Labs    05/08/24 1755 05/08/24 1755 05/08/24 1944 05/09/24 0844 05/09/24 1600 05/09/24 1632 05/10/24 0213 05/10/24 0443 05/10/24 1222  HGB 11.7*  --   --  11.9* 11.4*  --  10.6*  --  11.5*  HCT 34.3*  --   --  34.0* 33.4*  --  31.7*  --  32.5*  PLT 169  --   --  154 141*  --  110*  --  102*  APTT  --   --   --  73* 106*  --  116*  --   --   HEPARINUNFRC  --    < >  --  >1.10*  --  0.90* 0.72*  --  0.74*  CREATININE 1.15*  --   --  1.08* 1.04*  --   --  1.19*  --   TROPONINIHS 25*  --  27*  --   --   --   --   --   --    < > = values in this interval not displayed.   Estimated Creatinine Clearance: 82.6 mL/min (A) (by C-G formula based on SCr of 1.19 mg/dL (H)).  Medical History: Past Medical History:  Diagnosis Date   DVT (deep venous thrombosis) (HCC)    Assessment: 43 y.o female presenting with PE. Pt was started on an Eliquis  Starter Pack 03/30/24 for RLE DVT. Per ED notes, pt reported has had multiple blood clots since 2019. Apixaban  5mg  dose was given in ED 8/23 @ 21:45 CT angio chest was significant increase in clot burden. Pharmacy consulted for IV heparin  for PE.   Heparin  level remains slightly elevated at 0.74, no bleeding concerns.  Goal of Therapy:  Heparin  level 0.3-0.7 units/mL Monitor platelets by anticoagulation protocol: Yes   Plan:  Reduce heparin  infusion to 1050 units/h Recheck heparin  level in 6h  Ozell Jamaica, PharmD, [place], [facility] Clinical Pharmacist [phone] Please check AMION for all [facility] Pharmacy numbers 05/10/2024

## 2024-05-10 NOTE — Progress Notes (Signed)
 Progress Note   Patient: Denise Santana FMW:[id number] DOB: April 22, 1981 DOA: 05/08/2024     2 DOS: the patient was seen and examined on 05/10/2024    Brief Narrative:    From admission h and p    Denise Santana is a 43 y.o. female with medical history significant for recurrent DVT and PE who presents with pleuritic left-sided chest pain.   Patient was admitted to the hospital on March 28, 2024 and found to have recurrent DVT and PE.  She had not been taking Eliquis  as prescribed at that time due to financial constraints, was treated with IV heparin  in the hospital, and discharged back on Eliquis .  Patient reports that she has been strictly adherent with her Eliquis  since the recent discharge and has not missed any doses.  She has chest pain with deep breath, cough, and certain movements, and also feels short of breath with activity.  She denies any fevers, chills, lightheadedness, or syncope.   Assessment & Plan:   Principal Problem:   Recurrent pulmonary emboli (HCC) Active Problems:   Hypokalemia     # Acute PE with treatment failure Here with hypoxia and pleuritic left sided chest pain. CTA shows multiple b/l PEs with progression of clot burden since last month. Has been compliant with apixaban  since last month's hospitalization for acute PE/DVT (previously had not). Patient's sister died of a PE, thus inherited hypercoagulable disorder is quite possible.  CT shows evidence of right heart strain. Discussed with Dr. Tina of oncology who advises antiphospholipid eval. Doesn't recommend inherited thrombophilia panel as won't change mgmt and wouldn't explain oral treatment failure. Dr. Tina does advise switch to lovenox  at d/c (1 mg/kg bid) We will continue heparin  drip for now Follow-up on Doppler of lower extremity Patient is status post pulmonary artery catheter directed thrombolysis as well as IVC filter placement by IR on 05/09/2024 Follow-up antiphospholipid syndrome panel Follow-up  echocardiogram I have discussed the case with oncologist   Vitamin B12 deficiency Follow-up on antiparietal antibody and INF antibody test if positive we will give long-term vitamin B12 injection if negative will continue with B12 orally on discharge  # Acute hypoxic respiratory failure 2/2 PE, possible CHF Follow-up echo He did receive a dose of Lasix  Continue to wean down oxygen as tolerated   # Hypokalemia # Hypomagnesemia Improving - monitor and replete as needed   # Morbid obesity noted     DVT prophylaxis: IV heparin  Code Status: full Family Communication: none at bedside   Level of care: Telemetry Cardiac Status is: Inpatient Remains inpatient appropriate because: severity of illness       Consultants:  IR   Procedures: None thus far   Antimicrobials:  none      Subjective: Denies ongoing chest pain nausea vomiting Still requiring 2 L of oxygen Underwent IVC filter as well as IR guided pulmonary artery catheter directed thrombolysis yesterday   Examination:   General exam: Appears calm and comfortable  Respiratory system: Clear to auscultation. Respiratory effort normal. Cardiovascular system: S1 & S2 heard, RRR.   Gastrointestinal system: Abdomen is nondistended, soft and nontender.   Central nervous system: Alert and oriented. No focal neurological deficits. Extremities: Symmetric 5 x 5 power. Pitting edema LEs bilaterally R > L Skin: No rashes, lesions or ulcers Psychiatry: Judgement and insight appear normal. Mood & affect appropriate.        Data Reviewed:  Have reviewed the following labs patient  Vitals:   05/10/24 1500 05/10/24 1600 05/10/24  1658 05/10/24 1700  BP: 117/77 106/69 (!) 122/99 (!) 122/99  Pulse: 76 78 93 91  Resp: 10 19 14  (!) 22  Temp:      TempSrc:      SpO2: 96% 94% 100% 98%  Weight:      Height:           Latest Ref Rng & Units 05/10/2024   12:22 PM 05/10/2024    2:13 AM 05/09/2024    4:00 PM  CBC  WBC 4.0 -  10.5 K/uL 6.5  6.8  8.0   Hemoglobin 12.0 - 15.0 g/dL 88.4  89.3  88.5   Hematocrit 36.0 - 46.0 % 32.5  31.7  33.4   Platelets 150 - 400 K/uL 102  110  141        Latest Ref Rng & Units 05/10/2024    4:43 AM 05/09/2024    4:00 PM 05/09/2024    8:44 AM  BMP  Glucose 70 - 99 mg/dL 86  894  870   BUN 6 - 20 mg/dL 5  6  6    Creatinine 0.44 - 1.00 mg/dL 8.80  8.95  8.91   Sodium 135 - 145 mmol/L 137  138  139   Potassium 3.5 - 5.1 mmol/L 3.6  3.4  3.3   Chloride 98 - 111 mmol/L 109  108  107   CO2 22 - 32 mmol/L 22  20  21    Calcium 8.9 - 10.3 mg/dL 8.4  8.4  8.8      Author: Drue ONEIDA Potter, MD 05/10/2024 5:35 PM  For on call review [email or web address].

## 2024-05-10 NOTE — Progress Notes (Signed)
 Arrived bedside to perform 12 hour F/U PE lysis pressure measurements. Left lysis catheter pulled back approximately 4-5 cm into the Main pulmonary artery. Catheter flushed and aspirated. Pressure measuring transpac attached. Multiple attempts made to acquire a post measurement were unsuccessful. Lysis catheters and sheaths then removed without pressure measurements per Dr. Vanice.

## 2024-05-10 NOTE — Assessment & Plan Note (Signed)
 Recommend continue IV heparin  Transition to Lovenox  1 mg/kg twice daily when ready for discharge Follow-up with PCP for long-term monitoring Antiphospholipid syndrome testing pending.

## 2024-05-10 NOTE — Progress Notes (Signed)
 PHARMACY - ANTICOAGULATION CONSULT NOTE  Pharmacy Consult for heparin   Indication: pulmonary embolus  No Known Allergies  Patient Measurements: Height: 5' 7 (170.2 cm) Weight: 120 kg (264 lb 8 oz) IBW/kg (Calculated) : 61.6 HEPARIN  DW (KG): 89.9  Vital Signs: Temp: 98.6 F (37 C) (08/24 2344) Temp Source: Oral (08/24 2344) BP: 124/78 (08/25 0000) Pulse Rate: 84 (08/25 0015)  Labs: Recent Labs    05/08/24 1755 05/08/24 1944 05/09/24 0844 05/09/24 1600 05/09/24 1632 05/10/24 0213  HGB 11.7*  --  11.9* 11.4*  --  10.6*  HCT 34.3*  --  34.0* 33.4*  --  31.7*  PLT 169  --  154 141*  --  110*  APTT  --   --  73* 106*  --  116*  HEPARINUNFRC  --   --  >1.10*  --  0.90* 0.72*  CREATININE 1.15*  --  1.08* 1.04*  --   --   TROPONINIHS 25* 27*  --   --   --   --    Estimated Creatinine Clearance: 94.6 mL/min (A) (by C-G formula based on SCr of 1.04 mg/dL (H)).  Medical History: Past Medical History:  Diagnosis Date   DVT (deep venous thrombosis) (HCC)    Assessment: 43 y.o female presenting with PE. Pt was started on an Eliquis  Starter Pack 03/30/24 for RLE DVT. Per ED notes, pt reported has had multiple blood clots since 2019. Apixaban  5mg  dose was given in ED 8/23 @ 21:45 CT angio chest was significant increase in clot burden. Pharmacy consulted for IV heparin  for PE.   8/24 evening: heparin  level and aptt not correlating - aPTT slightly above goal, no issues with bleeding per RN. Patient is now s/p thrombolysis.  8/25 AM update:  Heparin  level supra-therapeutic  No need for further aPTT's-will DC  Goal of Therapy:  Heparin  level 0.3-0.7 units/mL Monitor platelets by anticoagulation protocol: Yes   Plan:  Reduce heparin  infusion to 1250 units/hr Heparin  level in 6-8 hours   Lynwood Mckusick, PharmD, BCPS Clinical Pharmacist Phone: [phone]

## 2024-05-10 NOTE — Consult Note (Signed)
 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  Patient has history of recurrent venous thromboembolism patient's most recent history report adherence to Eliquis  but progressions of PE.  She has tolerated injection well in the past.  She reports she was able to continue but due to cost issues she stopped her treatment.  She is currently on heparin  drip.  Once stable, patient should transition to Lovenox  1 mg/kg twice daily dosing.  He should continue this for a month and follow-up with PCP.  She may consider warfarin at that time after discussion with her PCP.  We also check for antiphospholipid syndrome.  Of note, review of records B12 and folate deficiency.  Repeat labs still show B12 deficiency, not replaced and low normal folate.  Recommend start B12 and folate. Assessment & Plan Recurrent pulmonary emboli (HCC) Recommend continue IV heparin  Transition to Lovenox  1 mg/kg twice daily when ready for discharge Follow-up with PCP for long-term monitoring Antiphospholipid syndrome testing pending.  B12 deficiency Will start B12 injection while inpatient. Check antiparietal antibody and IF antibody.  If positive on either, will need long-term injection If negative, continue B12 1000 mcg daily on discharge  I spent a total of 75 minutes including review of chart and various tests results, face-to-face time with the patient, discussions about results, plan of care and coordination of care plan with other providers and staff members.   [name], MD 05/10/2024 9:42 AM   CHIEF COMPLAINTS/PURPOSE OF ADMISSION Recurrent DVT and PE  HISTORY OF PRESENTING ILLNESS:  Denise Santana 43 y.o. female consulted for recurrent pulmonary emboli.   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  First  diagnosis in 2020.  She was traveling back in for with a 3-hour drive from [place]  for work of 12-hour shift.    Denies any trauma, injury, surgery, prolonged illness.  She is a smoker.  From [facility] health record first the emergency room here was 03/24/2021.  Ultrasound showed acute DVT in the right peroneal and posterior tibial vein.  She was recommended to start Xarelto .  She received a dose in the emergency room.  Reports transition to help her to get at the pharmacy the following day.  She presented on 04/23/2021 to the emergency room with worsening right lower extremity swelling.  Report of worsening shortness of breath as well.  Reports she was not on Xarelto  at that time. US  with below:  An ultrasound dated 06/22/2021 showed negative for right lower extremity DVT.  Venous reflux was noted.  RIGHT:  - Findings consistent with age indeterminate deep vein thrombosis involving the right posterior tibial veins, and right peroneal veins.  - Findings appear essentially unchanged compared to previous examination.   Report patient left AMA from the visit.  Patient was seen by vascular surgery on 06/22/2021.  At that time per records, patient was on Eliquis  prescribed by her PCP.  She was found to have reflux the right lower extremity.  He was recommended to wear thigh-high stocking.  On 09/25/2021 patient presented to the ED with complaint of shortness of breath with associated chest pain on exertion.  Ultrasound 09/26/2021 showed negative for bilateral DVT.  However report limitation due to body habitus and poor ultrasound/tissue interface. A VQ scan on 09/25/21 at [facility] health system reported no finding of  suspicious pulm embolism was identified.  Patient was admitted for AKI suspect related to hypovolemia.  She was placed on Lovenox  due to AKI at the time and reported switched back to Xarelto  on discharge.  On 03/29/2024 patient presented to emergency room complaining of right lower leg pain.  At the time  reported her last Eliquis  was probably 2 to 3 years ago.  CTA at that time showed bilateral pulmonary emboli.  Patient was admitted on heparin  drip.  She was discharged on 7/15 on Eliquis .  Ultrasound 7/14 showed acute on chronic DVT involving right posterior tibial vein right peroneal vein.  Acute DVT in the left lower extremity leg veins.  Patient presented on 04/25/2024 with chest pain, shortness of breath, feeling lightheadedness per ED record.  Reports she was compliant on Eliquis .  CTA at the time showed slight improvement of chronic pulmonary embolism.  No new filling defects are identified to suggest recurrent pulmonary embolism.  He was discharged without treatment changes.  She returned to ED on 8/23 with left-sided chest pain.  Reports she has been adherent to Eliquis .  She has not missed any doses.  New CTA showed notable bilateral pulmonary emboli.  There is progression of clot burden since previous study.  CT evidence of right heart strain.  Patient was admitted and started on IV heparin .  IR was consulted and recommended image guided PE lysis and IVC filter placement.  Patient report family history of DVT including her sister.  MEDICAL HISTORY:  Past Medical History:  Diagnosis Date   DVT (deep venous thrombosis) (HCC)     SURGICAL HISTORY: Past Surgical History:  Procedure Laterality Date   APPENDECTOMY     IR ANGIOGRAM PULMONARY BILATERAL SELECTIVE  05/09/2024   IR ANGIOGRAM SELECTIVE EACH ADDITIONAL VESSEL  05/09/2024   IR ANGIOGRAM SELECTIVE EACH ADDITIONAL VESSEL  05/09/2024   IR INFUSION THROMBOL ARTERIAL INITIAL (MS)  05/09/2024   IR INFUSION THROMBOL ARTERIAL INITIAL (MS)  05/09/2024   IR IVC FILTER PLMT / S&I /IMG GUID/MOD SED  05/09/2024   IR US  GUIDE VASC ACCESS RIGHT  05/09/2024   KIDNEY SURGERY      SOCIAL HISTORY: Social History   Socioeconomic History   Marital status: Single    Spouse name: Not on file   Number of children: Not on file   Years of education:  Not on file   Highest education level: Not on file  Occupational History   Not on file  Tobacco Use   Smoking status: Every Day    Types: Cigarettes   Smokeless tobacco: Never  Vaping Use   Vaping status: Never Used  Substance and Sexual Activity   Alcohol use: Never   Drug use: Never   Sexual activity: Not on file  Other Topics Concern   Not on file  Social History Narrative   Not on file   Social Drivers of Health   Financial Resource Strain: Not on file  Food Insecurity: No Food Insecurity (05/09/2024)   Hunger Vital Sign    Worried About Running Out of Food in the Last Year: Never true    Ran Out of Food in the Last Year: Never true  Transportation Needs: No Transportation Needs (05/09/2024)   PRAPARE - Administrator, Civil Service (Medical): No    Lack of Transportation (Non-Medical): No  Physical Activity: Not on file  Stress: Not on file  Social Connections: Not on file  Intimate Partner Violence: Not At Risk (05/09/2024)  Humiliation, Afraid, Rape, and Kick questionnaire    Fear of Current or Ex-Partner: No    Emotionally Abused: No    Physically Abused: No    Sexually Abused: No    FAMILY HISTORY: Family History  Problem Relation Age of Onset   Hypertension Mother    Hyperlipidemia Mother    Diabetes Mother    Heart failure Mother     ALLERGIES:  has no known allergies.  MEDICATIONS:  Current Facility-Administered Medications  Medication Dose Route Frequency Provider Last Rate Last Admin   0.9 %  sodium chloride  infusion  250 mL Intravenous PRN Johann Sieving, MD       0.9 %  sodium chloride  infusion   Intravenous Continuous Johann Sieving, MD       0.9 %  sodium chloride  infusion   Intravenous Continuous Johann Sieving, MD       acetaminophen  (TYLENOL ) tablet 650 mg  650 mg Oral Q6H PRN Opyd, Evalene RAMAN, MD       Or   acetaminophen  (TYLENOL ) suppository 650 mg  650 mg Rectal Q6H PRN Opyd, Evalene RAMAN, MD       Chlorhexidine  Gluconate  Cloth 2 % PADS 6 each  6 each Topical Daily Kandis Devaughn Sayres, MD   6 each at 05/10/24 9090   cyanocobalamin  (VITAMIN B12) injection 1,000 mcg  1,000 mcg Intramuscular Q0600 Tina Pauletta BROCKS, MD       heparin  ADULT infusion 100 units/mL (25000 units/250mL)  1,250 Units/hr Intravenous Continuous Ledford, James L, RPH 12.5 mL/hr at 05/10/24 0900 1,250 Units/hr at 05/10/24 0900   HYDROmorphone  (DILAUDID ) injection 1 mg  1 mg Intravenous Q4H PRN Kandis Devaughn Sayres, MD   1 mg at 05/10/24 9350   oxyCODONE  (Oxy IR/ROXICODONE ) immediate release tablet 5-10 mg  5-10 mg Oral Q4H PRN Opyd, Timothy S, MD   10 mg at 05/10/24 9164   polyethylene glycol (MIRALAX  / GLYCOLAX ) packet 17 g  17 g Oral Daily PRN Opyd, Evalene RAMAN, MD       prochlorperazine  (COMPAZINE ) injection 5 mg  5 mg Intravenous Q6H PRN Opyd, Evalene RAMAN, MD       sodium chloride  flush (NS) 0.9 % injection 3 mL  3 mL Intravenous Q12H Opyd, Evalene RAMAN, MD   3 mL at 05/10/24 9089   sodium chloride  flush (NS) 0.9 % injection 3 mL  3 mL Intravenous Q12H Johann Sieving, MD   3 mL at 05/10/24 0909   sodium chloride  flush (NS) 0.9 % injection 3 mL  3 mL Intravenous PRN Johann Sieving, MD        REVIEW OF SYSTEMS:   Constitutional: Denies fevers, weight loss or abnormal night sweats Eyes: Denies visual change Ears, nose, mouth, throat, and face: Denies sore throat or enlarged tongue Respiratory: Denies cough, shortness of breath or wheezes Cardiovascular: Denies palpitation, chest discomfort or chest pain Gastrointestinal:  Denies nausea, vomiting, diarrhea, constipation, heartburn or abdominal pain GU: Denies any hesitancy, dysuria, frequency, hematuria Skin: Denies abnormal skin rashes Lymphatics: Denies new lymphadenopathy or mass Neurological: Denies numbness, tingling or new weaknesses All other systems were reviewed with the patient and are negative.  PHYSICAL EXAMINATION:  Vitals:   05/10/24 0835 05/10/24 0900  BP:  120/79  Pulse: 82 84   Resp: (!) 26 (!) 24  Temp:    SpO2: 96% 90%   Filed Weights   05/09/24 0000  Weight: 264 lb 8 oz (120 kg)    GENERAL:alert, obese SKIN: skin color normal. EYES:  sclera clear LUNGS: clear to auscultation and normal breathing effort.  No wheeze or rales HEART: regular rate & rhythm and no murmurs ABDOMEN:abdomen soft, non-tender Musculoskeletal:  bilateral lower extremity edema NEURO: alert with fluent speech  LABORATORY DATA:  I have reviewed the data as listed Lab Results  Component Value Date   WBC 6.8 05/10/2024   HGB 10.6 (L) 05/10/2024   HCT 31.7 (L) 05/10/2024   MCV 101.3 (H) 05/10/2024   PLT 110 (L) 05/10/2024   Recent Labs    03/28/24 2256 03/30/24 0433 04/25/24 0225 05/08/24 1755 05/09/24 0844 05/09/24 1600 05/10/24 0443  NA 138   < > 141   < > 139 138 137  K 3.5   < > 3.5   < > 3.3* 3.4* 3.6  CL 105   < > 105   < > 107 108 109  CO2 23   < > 24   < > 21* 20* 22  GLUCOSE 107*   < > 102*   < > 129* 105* 86  BUN 13   < > 8   < > 6 6 5*  CREATININE 1.46*   < > 1.19*   < > 1.08* 1.04* 1.19*  CALCIUM 8.8*   < > 9.1   < > 8.8* 8.4* 8.4*  GFRNONAA 46*   < > 59*   < > >60 >60 59*  PROT 6.3*  --  6.7  --   --   --   --   ALBUMIN  3.6  --  3.9  --   --   --   --   AST 15  --  12*  --   --   --   --   ALT 14  --  13  --   --   --   --   ALKPHOS 91  --  92  --   --   --   --   BILITOT 1.3*  --  1.3*  --   --   --   --    < > = values in this interval not displayed.    RADIOGRAPHIC STUDIES: I have personally reviewed the radiological images as listed and agreed with the findings in the report. IR INFUSION THROMBOL ARTERIAL INITIAL (MS) Result Date: 05/09/2024 INDICATION: Recurrent sub massive pulmonary emboli, despite anticoagulation regimen. See consultation. Caval filtration to prevent further embolic activity, as well as pulmonary embolus thrombolysis is requested. EXAM: 1. ULTRASOUND GUIDANCE FOR VENOUS ACCESS X2 2. INFERIOR VENA CAVAGRAM 3. IVC FILTER  PLACEMENT (RETRIEVABLE) 4. PULMONARY ARTERIAL PRESSURE MEASUREMENT 5. FLUOROSCOPIC GUIDED PLACEMENT OF BILATERAL PULMONARY ARTERIAL LYTIC INFUSION CATHETERS COMPARISON:  CTA chest from overnight, and earlier studies MEDICATIONS: Intravenous Fentanyl  200mcg and Versed  4mg  were administered by RN during a total moderate (conscious) sedation time of 41 minutes; the patient's level of consciousness and physiological / cardiorespiratory status were monitored continuously by radiology RN under my direct supervision. CONTRAST:  50mL OMNIPAQUE  IOHEXOL  300 MG/ML SOLN, 40mL OMNIPAQUE  IOHEXOL  300 MG/ML SOLN32mL OMNIPAQUE  IOHEXOL  300 MG/ML SOLN, 40mL OMNIPAQUE  IOHEXOL  300 MG/ML SOLN1mL OMNIPAQUE  IOHEXOL  300 MG/ML SOLN, 40mL OMNIPAQUE  IOHEXOL  300 MG/ML SOLN FLUOROSCOPY TIME:  Radiation Exposure Index (as provided by the fluoroscopic device): 166 mGy air Kerma COMPLICATIONS: None immediate TECHNIQUE: Informed written consent was obtained from the patient after a discussion of the risks, benefits and alternatives to treatment. Questions regarding the procedure were encouraged and answered. A timeout was performed prior to the initiation of the procedure. Ultrasound  interrogation of the right internal jugular vein showed no clot, and vessel was selected for vascular access. The right neck was prepped and draped in the usual sterile fashion, and a sterile drape was applied covering the operative field. Maximum barrier sterile technique with sterile gowns and gloves were used for the procedure. A timeout was performed prior to the initiation of the procedure. Local anesthesia was provided with 1% lidocaine . Under real-time ultrasound guidance, micropuncture set was utilized to access the right IJ vein, and a 6 [place] vascular sheath was placed. Under real-time ultrasound guidance cranial to the initial access, the right IJ vein was accessed with a 21 gauge micropuncture needle; the needle tip within the vein was confirmed with  ultrasound image documentation. The needle was exchanged over a 018 guidewire for a transitional dilator, which allow advancement of the [facility] wire into the IVC. A long 6 French vascular sheath was placed for inferior venacavography. This demonstrated no caval thrombus. Renal vein inflows were evident. The retrievable Denali IVC filter was advanced through the sheath and successfully deployed under fluoroscopy at the L2 level. Followup cavagram demonstrates stable filter position and no evident complication. Sheath was exchanged for a short 9 [place] vascular sheath. With the use of a Rosen wire, an angled pigtail catheter was advanced into the left main pulmonary artery . Pressure measurements were then obtained from the left pulmonary artery. Over an exchange length Rosen wire, the pigtail catheter was exchanged for a 105/15 cm multi side-hole infusion catheter. With the use of a glidewire via the second sheath, the pigtail catheter was then advanced into the right main pulmonary artery . Over an exchange length Rosen wire, the pigtail catheter was exchanged for a 105/10 cm multi side-hole infusion catheter. A postprocedural fluoroscopic image was obtained of the check demonstrating final catheter positioning. Both vascular sheaths were secured at the right neck with 0 Prolene suture. The external catheter tubing was secured at the right chest and the lytic therapy was initiated. The patient tolerated the procedure well without immediate postprocedural complication. FINDINGS: IVC widely patent with single renal vein inflows at the L1-2 level. Technically successful IVC filter deployment. This is a retrievable model. Acquired pressure measurements: Left main pulmonary artery - 64/21(41)mmHg (normal: < 25/10) Following the procedure, both infusion catheter tips terminate within the distal aspects of the bilateral lower lobe sub segmental pulmonary arteries. IMPRESSION: 1. Normal  inferior vena cavagram. 2.  Successful IVC filter deployment.  This is a retrievable model. 3. Fluoroscopic guided initiation of bilateral catheter directed pulmonary arterial lysis for sub massive pulmonary embolism and right-sided heart strain. 4. Markedly elevated pressure measurements within the left main pulmonary artery compatible with critical pulmonary arterial hypertension. PLAN: -bedside pulmonary arterial pressure measurements in the morning after overnight thrombolytic infusion. -The IVC filter is potentially retrievable. The patient will be assessed for filter retrieval by Interventional Radiology in approximately 8-12 weeks. Further recommendations regarding filter retrieval, continued surveillance or declaration of device permanence, will be made at that time. Electronically Signed   By: JONETTA Faes M.D.   On: 05/09/2024 15:19   IR IVC FILTER PLMT / S&I PORTER GUID/MOD SED Result Date: 05/09/2024 INDICATION: Recurrent sub massive pulmonary emboli, despite anticoagulation regimen. See consultation. Caval filtration to prevent further embolic activity, as well as pulmonary embolus thrombolysis is requested. EXAM: 1. ULTRASOUND GUIDANCE FOR VENOUS ACCESS X2 2. INFERIOR VENA CAVAGRAM 3. IVC FILTER PLACEMENT (RETRIEVABLE) 4. PULMONARY ARTERIAL PRESSURE MEASUREMENT 5. FLUOROSCOPIC GUIDED PLACEMENT OF BILATERAL PULMONARY ARTERIAL  LYTIC INFUSION CATHETERS COMPARISON:  CTA chest from overnight, and earlier studies MEDICATIONS: Intravenous Fentanyl  200mcg and Versed  4mg  were administered by RN during a total moderate (conscious) sedation time of 41 minutes; the patient's level of consciousness and physiological / cardiorespiratory status were monitored continuously by radiology RN under my direct supervision. CONTRAST:  50mL OMNIPAQUE  IOHEXOL  300 MG/ML SOLN, 40mL OMNIPAQUE  IOHEXOL  300 MG/ML SOLN45mL OMNIPAQUE  IOHEXOL  300 MG/ML SOLN, 40mL OMNIPAQUE  IOHEXOL  300 MG/ML SOLN58mL OMNIPAQUE  IOHEXOL  300 MG/ML SOLN, 40mL OMNIPAQUE  IOHEXOL  300  MG/ML SOLN FLUOROSCOPY TIME:  Radiation Exposure Index (as provided by the fluoroscopic device): 166 mGy air Kerma COMPLICATIONS: None immediate TECHNIQUE: Informed written consent was obtained from the patient after a discussion of the risks, benefits and alternatives to treatment. Questions regarding the procedure were encouraged and answered. A timeout was performed prior to the initiation of the procedure. Ultrasound interrogation of the right internal jugular vein showed no clot, and vessel was selected for vascular access. The right neck was prepped and draped in the usual sterile fashion, and a sterile drape was applied covering the operative field. Maximum barrier sterile technique with sterile gowns and gloves were used for the procedure. A timeout was performed prior to the initiation of the procedure. Local anesthesia was provided with 1% lidocaine . Under real-time ultrasound guidance, micropuncture set was utilized to access the right IJ vein, and a 6 [place] vascular sheath was placed. Under real-time ultrasound guidance cranial to the initial access, the right IJ vein was accessed with a 21 gauge micropuncture needle; the needle tip within the vein was confirmed with ultrasound image documentation. The needle was exchanged over a 018 guidewire for a transitional dilator, which allow advancement of the [facility] wire into the IVC. A long 6 French vascular sheath was placed for inferior venacavography. This demonstrated no caval thrombus. Renal vein inflows were evident. The retrievable Denali IVC filter was advanced through the sheath and successfully deployed under fluoroscopy at the L2 level. Followup cavagram demonstrates stable filter position and no evident complication. Sheath was exchanged for a short 9 [place] vascular sheath. With the use of a Rosen wire, an angled pigtail catheter was advanced into the left main pulmonary artery . Pressure measurements were then obtained from the left pulmonary  artery. Over an exchange length Rosen wire, the pigtail catheter was exchanged for a 105/15 cm multi side-hole infusion catheter. With the use of a glidewire via the second sheath, the pigtail catheter was then advanced into the right main pulmonary artery . Over an exchange length Rosen wire, the pigtail catheter was exchanged for a 105/10 cm multi side-hole infusion catheter. A postprocedural fluoroscopic image was obtained of the check demonstrating final catheter positioning. Both vascular sheaths were secured at the right neck with 0 Prolene suture. The external catheter tubing was secured at the right chest and the lytic therapy was initiated. The patient tolerated the procedure well without immediate postprocedural complication. FINDINGS: IVC widely patent with single renal vein inflows at the L1-2 level. Technically successful IVC filter deployment. This is a retrievable model. Acquired pressure measurements: Left main pulmonary artery - 64/21(41)mmHg (normal: < 25/10) Following the procedure, both infusion catheter tips terminate within the distal aspects of the bilateral lower lobe sub segmental pulmonary arteries. IMPRESSION: 1. Normal  inferior vena cavagram. 2. Successful IVC filter deployment.  This is a retrievable model. 3. Fluoroscopic guided initiation of bilateral catheter directed pulmonary arterial lysis for sub massive pulmonary embolism and right-sided heart strain. 4. Markedly elevated pressure measurements  within the left main pulmonary artery compatible with critical pulmonary arterial hypertension. PLAN: -bedside pulmonary arterial pressure measurements in the morning after overnight thrombolytic infusion. -The IVC filter is potentially retrievable. The patient will be assessed for filter retrieval by Interventional Radiology in approximately 8-12 weeks. Further recommendations regarding filter retrieval, continued surveillance or declaration of device permanence, will be made at that  time. Electronically Signed   By: JONETTA Faes M.D.   On: 05/09/2024 15:19   IR INFUSION THROMBOL ARTERIAL INITIAL (MS) Result Date: 05/09/2024 INDICATION: Recurrent sub massive pulmonary emboli, despite anticoagulation regimen. See consultation. Caval filtration to prevent further embolic activity, as well as pulmonary embolus thrombolysis is requested. EXAM: 1. ULTRASOUND GUIDANCE FOR VENOUS ACCESS X2 2. INFERIOR VENA CAVAGRAM 3. IVC FILTER PLACEMENT (RETRIEVABLE) 4. PULMONARY ARTERIAL PRESSURE MEASUREMENT 5. FLUOROSCOPIC GUIDED PLACEMENT OF BILATERAL PULMONARY ARTERIAL LYTIC INFUSION CATHETERS COMPARISON:  CTA chest from overnight, and earlier studies MEDICATIONS: Intravenous Fentanyl  200mcg and Versed  4mg  were administered by RN during a total moderate (conscious) sedation time of 41 minutes; the patient's level of consciousness and physiological / cardiorespiratory status were monitored continuously by radiology RN under my direct supervision. CONTRAST:  50mL OMNIPAQUE  IOHEXOL  300 MG/ML SOLN, 40mL OMNIPAQUE  IOHEXOL  300 MG/ML SOLN53mL OMNIPAQUE  IOHEXOL  300 MG/ML SOLN, 40mL OMNIPAQUE  IOHEXOL  300 MG/ML SOLN72mL OMNIPAQUE  IOHEXOL  300 MG/ML SOLN, 40mL OMNIPAQUE  IOHEXOL  300 MG/ML SOLN FLUOROSCOPY TIME:  Radiation Exposure Index (as provided by the fluoroscopic device): 166 mGy air Kerma COMPLICATIONS: None immediate TECHNIQUE: Informed written consent was obtained from the patient after a discussion of the risks, benefits and alternatives to treatment. Questions regarding the procedure were encouraged and answered. A timeout was performed prior to the initiation of the procedure. Ultrasound interrogation of the right internal jugular vein showed no clot, and vessel was selected for vascular access. The right neck was prepped and draped in the usual sterile fashion, and a sterile drape was applied covering the operative field. Maximum barrier sterile technique with sterile gowns and gloves were used for the  procedure. A timeout was performed prior to the initiation of the procedure. Local anesthesia was provided with 1% lidocaine . Under real-time ultrasound guidance, micropuncture set was utilized to access the right IJ vein, and a 6 [place] vascular sheath was placed. Under real-time ultrasound guidance cranial to the initial access, the right IJ vein was accessed with a 21 gauge micropuncture needle; the needle tip within the vein was confirmed with ultrasound image documentation. The needle was exchanged over a 018 guidewire for a transitional dilator, which allow advancement of the [facility] wire into the IVC. A long 6 French vascular sheath was placed for inferior venacavography. This demonstrated no caval thrombus. Renal vein inflows were evident. The retrievable Denali IVC filter was advanced through the sheath and successfully deployed under fluoroscopy at the L2 level. Followup cavagram demonstrates stable filter position and no evident complication. Sheath was exchanged for a short 9 [place] vascular sheath. With the use of a Rosen wire, an angled pigtail catheter was advanced into the left main pulmonary artery . Pressure measurements were then obtained from the left pulmonary artery. Over an exchange length Rosen wire, the pigtail catheter was exchanged for a 105/15 cm multi side-hole infusion catheter. With the use of a glidewire via the second sheath, the pigtail catheter was then advanced into the right main pulmonary artery . Over an exchange length Rosen wire, the pigtail catheter was exchanged for a 105/10 cm multi side-hole infusion catheter. A postprocedural fluoroscopic image was obtained  of the check demonstrating final catheter positioning. Both vascular sheaths were secured at the right neck with 0 Prolene suture. The external catheter tubing was secured at the right chest and the lytic therapy was initiated. The patient tolerated the procedure well without immediate postprocedural complication.  FINDINGS: IVC widely patent with single renal vein inflows at the L1-2 level. Technically successful IVC filter deployment. This is a retrievable model. Acquired pressure measurements: Left main pulmonary artery - 64/21(41)mmHg (normal: < 25/10) Following the procedure, both infusion catheter tips terminate within the distal aspects of the bilateral lower lobe sub segmental pulmonary arteries. IMPRESSION: 1. Normal  inferior vena cavagram. 2. Successful IVC filter deployment.  This is a retrievable model. 3. Fluoroscopic guided initiation of bilateral catheter directed pulmonary arterial lysis for sub massive pulmonary embolism and right-sided heart strain. 4. Markedly elevated pressure measurements within the left main pulmonary artery compatible with critical pulmonary arterial hypertension. PLAN: -bedside pulmonary arterial pressure measurements in the morning after overnight thrombolytic infusion. -The IVC filter is potentially retrievable. The patient will be assessed for filter retrieval by Interventional Radiology in approximately 8-12 weeks. Further recommendations regarding filter retrieval, continued surveillance or declaration of device permanence, will be made at that time. Electronically Signed   By: JONETTA Faes M.D.   On: 05/09/2024 15:19   IR US  Guide Vasc Access Right Result Date: 05/09/2024 INDICATION: Recurrent sub massive pulmonary emboli, despite anticoagulation regimen. See consultation. Caval filtration to prevent further embolic activity, as well as pulmonary embolus thrombolysis is requested. EXAM: 1. ULTRASOUND GUIDANCE FOR VENOUS ACCESS X2 2. INFERIOR VENA CAVAGRAM 3. IVC FILTER PLACEMENT (RETRIEVABLE) 4. PULMONARY ARTERIAL PRESSURE MEASUREMENT 5. FLUOROSCOPIC GUIDED PLACEMENT OF BILATERAL PULMONARY ARTERIAL LYTIC INFUSION CATHETERS COMPARISON:  CTA chest from overnight, and earlier studies MEDICATIONS: Intravenous Fentanyl  200mcg and Versed  4mg  were administered by RN during a total  moderate (conscious) sedation time of 41 minutes; the patient's level of consciousness and physiological / cardiorespiratory status were monitored continuously by radiology RN under my direct supervision. CONTRAST:  50mL OMNIPAQUE  IOHEXOL  300 MG/ML SOLN, 40mL OMNIPAQUE  IOHEXOL  300 MG/ML SOLN83mL OMNIPAQUE  IOHEXOL  300 MG/ML SOLN, 40mL OMNIPAQUE  IOHEXOL  300 MG/ML SOLN60mL OMNIPAQUE  IOHEXOL  300 MG/ML SOLN, 40mL OMNIPAQUE  IOHEXOL  300 MG/ML SOLN FLUOROSCOPY TIME:  Radiation Exposure Index (as provided by the fluoroscopic device): 166 mGy air Kerma COMPLICATIONS: None immediate TECHNIQUE: Informed written consent was obtained from the patient after a discussion of the risks, benefits and alternatives to treatment. Questions regarding the procedure were encouraged and answered. A timeout was performed prior to the initiation of the procedure. Ultrasound interrogation of the right internal jugular vein showed no clot, and vessel was selected for vascular access. The right neck was prepped and draped in the usual sterile fashion, and a sterile drape was applied covering the operative field. Maximum barrier sterile technique with sterile gowns and gloves were used for the procedure. A timeout was performed prior to the initiation of the procedure. Local anesthesia was provided with 1% lidocaine . Under real-time ultrasound guidance, micropuncture set was utilized to access the right IJ vein, and a 6 [place] vascular sheath was placed. Under real-time ultrasound guidance cranial to the initial access, the right IJ vein was accessed with a 21 gauge micropuncture needle; the needle tip within the vein was confirmed with ultrasound image documentation. The needle was exchanged over a 018 guidewire for a transitional dilator, which allow advancement of the [facility] wire into the IVC. A long 6 French vascular sheath was placed for inferior venacavography. This  demonstrated no caval thrombus. Renal vein inflows were evident. The  retrievable Denali IVC filter was advanced through the sheath and successfully deployed under fluoroscopy at the L2 level. Followup cavagram demonstrates stable filter position and no evident complication. Sheath was exchanged for a short 9 [place] vascular sheath. With the use of a Rosen wire, an angled pigtail catheter was advanced into the left main pulmonary artery . Pressure measurements were then obtained from the left pulmonary artery. Over an exchange length Rosen wire, the pigtail catheter was exchanged for a 105/15 cm multi side-hole infusion catheter. With the use of a glidewire via the second sheath, the pigtail catheter was then advanced into the right main pulmonary artery . Over an exchange length Rosen wire, the pigtail catheter was exchanged for a 105/10 cm multi side-hole infusion catheter. A postprocedural fluoroscopic image was obtained of the check demonstrating final catheter positioning. Both vascular sheaths were secured at the right neck with 0 Prolene suture. The external catheter tubing was secured at the right chest and the lytic therapy was initiated. The patient tolerated the procedure well without immediate postprocedural complication. FINDINGS: IVC widely patent with single renal vein inflows at the L1-2 level. Technically successful IVC filter deployment. This is a retrievable model. Acquired pressure measurements: Left main pulmonary artery - 64/21(41)mmHg (normal: < 25/10) Following the procedure, both infusion catheter tips terminate within the distal aspects of the bilateral lower lobe sub segmental pulmonary arteries. IMPRESSION: 1. Normal  inferior vena cavagram. 2. Successful IVC filter deployment.  This is a retrievable model. 3. Fluoroscopic guided initiation of bilateral catheter directed pulmonary arterial lysis for sub massive pulmonary embolism and right-sided heart strain. 4. Markedly elevated pressure measurements within the left main pulmonary artery compatible with  critical pulmonary arterial hypertension. PLAN: -bedside pulmonary arterial pressure measurements in the morning after overnight thrombolytic infusion. -The IVC filter is potentially retrievable. The patient will be assessed for filter retrieval by Interventional Radiology in approximately 8-12 weeks. Further recommendations regarding filter retrieval, continued surveillance or declaration of device permanence, will be made at that time. Electronically Signed   By: JONETTA Faes M.D.   On: 05/09/2024 15:19   IR Angiogram Pulmonary Bilateral Selective Result Date: 05/09/2024 INDICATION: Recurrent sub massive pulmonary emboli, despite anticoagulation regimen. See consultation. Caval filtration to prevent further embolic activity, as well as pulmonary embolus thrombolysis is requested. EXAM: 1. ULTRASOUND GUIDANCE FOR VENOUS ACCESS X2 2. INFERIOR VENA CAVAGRAM 3. IVC FILTER PLACEMENT (RETRIEVABLE) 4. PULMONARY ARTERIAL PRESSURE MEASUREMENT 5. FLUOROSCOPIC GUIDED PLACEMENT OF BILATERAL PULMONARY ARTERIAL LYTIC INFUSION CATHETERS COMPARISON:  CTA chest from overnight, and earlier studies MEDICATIONS: Intravenous Fentanyl  200mcg and Versed  4mg  were administered by RN during a total moderate (conscious) sedation time of 41 minutes; the patient's level of consciousness and physiological / cardiorespiratory status were monitored continuously by radiology RN under my direct supervision. CONTRAST:  50mL OMNIPAQUE  IOHEXOL  300 MG/ML SOLN, 40mL OMNIPAQUE  IOHEXOL  300 MG/ML SOLN74mL OMNIPAQUE  IOHEXOL  300 MG/ML SOLN, 40mL OMNIPAQUE  IOHEXOL  300 MG/ML SOLN52mL OMNIPAQUE  IOHEXOL  300 MG/ML SOLN, 40mL OMNIPAQUE  IOHEXOL  300 MG/ML SOLN FLUOROSCOPY TIME:  Radiation Exposure Index (as provided by the fluoroscopic device): 166 mGy air Kerma COMPLICATIONS: None immediate TECHNIQUE: Informed written consent was obtained from the patient after a discussion of the risks, benefits and alternatives to treatment. Questions regarding the procedure  were encouraged and answered. A timeout was performed prior to the initiation of the procedure. Ultrasound interrogation of the right internal jugular vein showed no clot, and vessel was selected for  vascular access. The right neck was prepped and draped in the usual sterile fashion, and a sterile drape was applied covering the operative field. Maximum barrier sterile technique with sterile gowns and gloves were used for the procedure. A timeout was performed prior to the initiation of the procedure. Local anesthesia was provided with 1% lidocaine . Under real-time ultrasound guidance, micropuncture set was utilized to access the right IJ vein, and a 6 [place] vascular sheath was placed. Under real-time ultrasound guidance cranial to the initial access, the right IJ vein was accessed with a 21 gauge micropuncture needle; the needle tip within the vein was confirmed with ultrasound image documentation. The needle was exchanged over a 018 guidewire for a transitional dilator, which allow advancement of the [facility] wire into the IVC. A long 6 French vascular sheath was placed for inferior venacavography. This demonstrated no caval thrombus. Renal vein inflows were evident. The retrievable Denali IVC filter was advanced through the sheath and successfully deployed under fluoroscopy at the L2 level. Followup cavagram demonstrates stable filter position and no evident complication. Sheath was exchanged for a short 9 [place] vascular sheath. With the use of a Rosen wire, an angled pigtail catheter was advanced into the left main pulmonary artery . Pressure measurements were then obtained from the left pulmonary artery. Over an exchange length Rosen wire, the pigtail catheter was exchanged for a 105/15 cm multi side-hole infusion catheter. With the use of a glidewire via the second sheath, the pigtail catheter was then advanced into the right main pulmonary artery . Over an exchange length Rosen wire, the pigtail catheter was  exchanged for a 105/10 cm multi side-hole infusion catheter. A postprocedural fluoroscopic image was obtained of the check demonstrating final catheter positioning. Both vascular sheaths were secured at the right neck with 0 Prolene suture. The external catheter tubing was secured at the right chest and the lytic therapy was initiated. The patient tolerated the procedure well without immediate postprocedural complication. FINDINGS: IVC widely patent with single renal vein inflows at the L1-2 level. Technically successful IVC filter deployment. This is a retrievable model. Acquired pressure measurements: Left main pulmonary artery - 64/21(41)mmHg (normal: < 25/10) Following the procedure, both infusion catheter tips terminate within the distal aspects of the bilateral lower lobe sub segmental pulmonary arteries. IMPRESSION: 1. Normal  inferior vena cavagram. 2. Successful IVC filter deployment.  This is a retrievable model. 3. Fluoroscopic guided initiation of bilateral catheter directed pulmonary arterial lysis for sub massive pulmonary embolism and right-sided heart strain. 4. Markedly elevated pressure measurements within the left main pulmonary artery compatible with critical pulmonary arterial hypertension. PLAN: -bedside pulmonary arterial pressure measurements in the morning after overnight thrombolytic infusion. -The IVC filter is potentially retrievable. The patient will be assessed for filter retrieval by Interventional Radiology in approximately 8-12 weeks. Further recommendations regarding filter retrieval, continued surveillance or declaration of device permanence, will be made at that time. Electronically Signed   By: JONETTA Faes M.D.   On: 05/09/2024 15:19   IR Angiogram Selective Each Additional Vessel Result Date: 05/09/2024 INDICATION: Recurrent sub massive pulmonary emboli, despite anticoagulation regimen. See consultation. Caval filtration to prevent further embolic activity, as well as  pulmonary embolus thrombolysis is requested. EXAM: 1. ULTRASOUND GUIDANCE FOR VENOUS ACCESS X2 2. INFERIOR VENA CAVAGRAM 3. IVC FILTER PLACEMENT (RETRIEVABLE) 4. PULMONARY ARTERIAL PRESSURE MEASUREMENT 5. FLUOROSCOPIC GUIDED PLACEMENT OF BILATERAL PULMONARY ARTERIAL LYTIC INFUSION CATHETERS COMPARISON:  CTA chest from overnight, and earlier studies MEDICATIONS: Intravenous Fentanyl  200mcg and Versed   4mg  were administered by RN during a total moderate (conscious) sedation time of 41 minutes; the patient's level of consciousness and physiological / cardiorespiratory status were monitored continuously by radiology RN under my direct supervision. CONTRAST:  50mL OMNIPAQUE  IOHEXOL  300 MG/ML SOLN, 40mL OMNIPAQUE  IOHEXOL  300 MG/ML SOLN48mL OMNIPAQUE  IOHEXOL  300 MG/ML SOLN, 40mL OMNIPAQUE  IOHEXOL  300 MG/ML SOLN46mL OMNIPAQUE  IOHEXOL  300 MG/ML SOLN, 40mL OMNIPAQUE  IOHEXOL  300 MG/ML SOLN FLUOROSCOPY TIME:  Radiation Exposure Index (as provided by the fluoroscopic device): 166 mGy air Kerma COMPLICATIONS: None immediate TECHNIQUE: Informed written consent was obtained from the patient after a discussion of the risks, benefits and alternatives to treatment. Questions regarding the procedure were encouraged and answered. A timeout was performed prior to the initiation of the procedure. Ultrasound interrogation of the right internal jugular vein showed no clot, and vessel was selected for vascular access. The right neck was prepped and draped in the usual sterile fashion, and a sterile drape was applied covering the operative field. Maximum barrier sterile technique with sterile gowns and gloves were used for the procedure. A timeout was performed prior to the initiation of the procedure. Local anesthesia was provided with 1% lidocaine . Under real-time ultrasound guidance, micropuncture set was utilized to access the right IJ vein, and a 6 [place] vascular sheath was placed. Under real-time ultrasound guidance cranial to the  initial access, the right IJ vein was accessed with a 21 gauge micropuncture needle; the needle tip within the vein was confirmed with ultrasound image documentation. The needle was exchanged over a 018 guidewire for a transitional dilator, which allow advancement of the [facility] wire into the IVC. A long 6 French vascular sheath was placed for inferior venacavography. This demonstrated no caval thrombus. Renal vein inflows were evident. The retrievable Denali IVC filter was advanced through the sheath and successfully deployed under fluoroscopy at the L2 level. Followup cavagram demonstrates stable filter position and no evident complication. Sheath was exchanged for a short 9 [place] vascular sheath. With the use of a Rosen wire, an angled pigtail catheter was advanced into the left main pulmonary artery . Pressure measurements were then obtained from the left pulmonary artery. Over an exchange length Rosen wire, the pigtail catheter was exchanged for a 105/15 cm multi side-hole infusion catheter. With the use of a glidewire via the second sheath, the pigtail catheter was then advanced into the right main pulmonary artery . Over an exchange length Rosen wire, the pigtail catheter was exchanged for a 105/10 cm multi side-hole infusion catheter. A postprocedural fluoroscopic image was obtained of the check demonstrating final catheter positioning. Both vascular sheaths were secured at the right neck with 0 Prolene suture. The external catheter tubing was secured at the right chest and the lytic therapy was initiated. The patient tolerated the procedure well without immediate postprocedural complication. FINDINGS: IVC widely patent with single renal vein inflows at the L1-2 level. Technically successful IVC filter deployment. This is a retrievable model. Acquired pressure measurements: Left main pulmonary artery - 64/21(41)mmHg (normal: < 25/10) Following the procedure, both infusion catheter tips terminate within the  distal aspects of the bilateral lower lobe sub segmental pulmonary arteries. IMPRESSION: 1. Normal  inferior vena cavagram. 2. Successful IVC filter deployment.  This is a retrievable model. 3. Fluoroscopic guided initiation of bilateral catheter directed pulmonary arterial lysis for sub massive pulmonary embolism and right-sided heart strain. 4. Markedly elevated pressure measurements within the left main pulmonary artery compatible with critical pulmonary arterial hypertension. PLAN: -bedside pulmonary arterial pressure measurements  in the morning after overnight thrombolytic infusion. -The IVC filter is potentially retrievable. The patient will be assessed for filter retrieval by Interventional Radiology in approximately 8-12 weeks. Further recommendations regarding filter retrieval, continued surveillance or declaration of device permanence, will be made at that time. Electronically Signed   By: JONETTA Faes M.D.   On: 05/09/2024 15:19   IR Angiogram Selective Each Additional Vessel Result Date: 05/09/2024 INDICATION: Recurrent sub massive pulmonary emboli, despite anticoagulation regimen. See consultation. Caval filtration to prevent further embolic activity, as well as pulmonary embolus thrombolysis is requested. EXAM: 1. ULTRASOUND GUIDANCE FOR VENOUS ACCESS X2 2. INFERIOR VENA CAVAGRAM 3. IVC FILTER PLACEMENT (RETRIEVABLE) 4. PULMONARY ARTERIAL PRESSURE MEASUREMENT 5. FLUOROSCOPIC GUIDED PLACEMENT OF BILATERAL PULMONARY ARTERIAL LYTIC INFUSION CATHETERS COMPARISON:  CTA chest from overnight, and earlier studies MEDICATIONS: Intravenous Fentanyl  200mcg and Versed  4mg  were administered by RN during a total moderate (conscious) sedation time of 41 minutes; the patient's level of consciousness and physiological / cardiorespiratory status were monitored continuously by radiology RN under my direct supervision. CONTRAST:  50mL OMNIPAQUE  IOHEXOL  300 MG/ML SOLN, 40mL OMNIPAQUE  IOHEXOL  300 MG/ML SOLN65mL OMNIPAQUE   IOHEXOL  300 MG/ML SOLN, 40mL OMNIPAQUE  IOHEXOL  300 MG/ML SOLN75mL OMNIPAQUE  IOHEXOL  300 MG/ML SOLN, 40mL OMNIPAQUE  IOHEXOL  300 MG/ML SOLN FLUOROSCOPY TIME:  Radiation Exposure Index (as provided by the fluoroscopic device): 166 mGy air Kerma COMPLICATIONS: None immediate TECHNIQUE: Informed written consent was obtained from the patient after a discussion of the risks, benefits and alternatives to treatment. Questions regarding the procedure were encouraged and answered. A timeout was performed prior to the initiation of the procedure. Ultrasound interrogation of the right internal jugular vein showed no clot, and vessel was selected for vascular access. The right neck was prepped and draped in the usual sterile fashion, and a sterile drape was applied covering the operative field. Maximum barrier sterile technique with sterile gowns and gloves were used for the procedure. A timeout was performed prior to the initiation of the procedure. Local anesthesia was provided with 1% lidocaine . Under real-time ultrasound guidance, micropuncture set was utilized to access the right IJ vein, and a 6 [place] vascular sheath was placed. Under real-time ultrasound guidance cranial to the initial access, the right IJ vein was accessed with a 21 gauge micropuncture needle; the needle tip within the vein was confirmed with ultrasound image documentation. The needle was exchanged over a 018 guidewire for a transitional dilator, which allow advancement of the [facility] wire into the IVC. A long 6 French vascular sheath was placed for inferior venacavography. This demonstrated no caval thrombus. Renal vein inflows were evident. The retrievable Denali IVC filter was advanced through the sheath and successfully deployed under fluoroscopy at the L2 level. Followup cavagram demonstrates stable filter position and no evident complication. Sheath was exchanged for a short 9 [place] vascular sheath. With the use of a Rosen wire, an angled pigtail  catheter was advanced into the left main pulmonary artery . Pressure measurements were then obtained from the left pulmonary artery. Over an exchange length Rosen wire, the pigtail catheter was exchanged for a 105/15 cm multi side-hole infusion catheter. With the use of a glidewire via the second sheath, the pigtail catheter was then advanced into the right main pulmonary artery . Over an exchange length Rosen wire, the pigtail catheter was exchanged for a 105/10 cm multi side-hole infusion catheter. A postprocedural fluoroscopic image was obtained of the check demonstrating final catheter positioning. Both vascular sheaths were secured at the right neck with 0  Prolene suture. The external catheter tubing was secured at the right chest and the lytic therapy was initiated. The patient tolerated the procedure well without immediate postprocedural complication. FINDINGS: IVC widely patent with single renal vein inflows at the L1-2 level. Technically successful IVC filter deployment. This is a retrievable model. Acquired pressure measurements: Left main pulmonary artery - 64/21(41)mmHg (normal: < 25/10) Following the procedure, both infusion catheter tips terminate within the distal aspects of the bilateral lower lobe sub segmental pulmonary arteries. IMPRESSION: 1. Normal  inferior vena cavagram. 2. Successful IVC filter deployment.  This is a retrievable model. 3. Fluoroscopic guided initiation of bilateral catheter directed pulmonary arterial lysis for sub massive pulmonary embolism and right-sided heart strain. 4. Markedly elevated pressure measurements within the left main pulmonary artery compatible with critical pulmonary arterial hypertension. PLAN: -bedside pulmonary arterial pressure measurements in the morning after overnight thrombolytic infusion. -The IVC filter is potentially retrievable. The patient will be assessed for filter retrieval by Interventional Radiology in approximately 8-12 weeks. Further  recommendations regarding filter retrieval, continued surveillance or declaration of device permanence, will be made at that time. Electronically Signed   By: JONETTA Faes M.D.   On: 05/09/2024 15:19   ECHOCARDIOGRAM COMPLETE Result Date: 05/09/2024    ECHOCARDIOGRAM REPORT   Patient Name:   Denise Santana Date of Exam: 05/09/2024 Medical Rec #:  [id number]    Height:       67.0 in Accession #:    [id number]   Weight:       264.5 lb Date of Birth:  1981-06-15    BSA:          2.277 m Patient Age:    43 years     BP:           125/91 mmHg Patient Gender: F            HR:           80 bpm. Exam Location:  Inpatient Procedure: 2D Echo, Cardiac Doppler and Color Doppler (Both Spectral and Color            Flow Doppler were utilized during procedure). Indications:    Pulmonary Embolus I26.09  History:        Patient has prior history of Echocardiogram examinations, most                 recent 09/26/2021. Signs/Symptoms:Chest Pain.  Sonographer:    Thea Norlander RCS Referring Phys: [id number] TIMOTHY S OPYD IMPRESSIONS  1. Left ventricular ejection fraction, by estimation, is 50 to 55%. The left ventricle has low normal function. Left ventricular endocardial border not optimally defined to evaluate regional wall motion. Left ventricular diastolic parameters were normal.  2. Right ventricular systolic function is normal. The right ventricular size is normal.  3. The mitral valve is normal in structure. No evidence of mitral valve regurgitation. No evidence of mitral stenosis.  4. The aortic valve is tricuspid. Aortic valve regurgitation is not visualized. No aortic stenosis is present.  5. The inferior vena cava is normal in size with greater than 50% respiratory variability, suggesting right atrial pressure of 3 mmHg. FINDINGS  Left Ventricle: Left ventricular ejection fraction, by estimation, is 50 to 55%. The left ventricle has low normal function. Left ventricular endocardial border not optimally defined to evaluate  regional wall motion. The left ventricular internal cavity  size was normal in size. There is no left ventricular hypertrophy. Left ventricular diastolic parameters were normal. Right Ventricle: The right  ventricular size is normal. Right vetricular wall thickness was not well visualized. Right ventricular systolic function is normal. Left Atrium: Left atrial size was normal in size. Right Atrium: Right atrial size was normal in size. Pericardium: There is no evidence of pericardial effusion. Mitral Valve: The mitral valve is normal in structure. No evidence of mitral valve regurgitation. No evidence of mitral valve stenosis. Tricuspid Valve: The tricuspid valve is normal in structure. Tricuspid valve regurgitation is trivial. No evidence of tricuspid stenosis. Aortic Valve: The aortic valve is tricuspid. Aortic valve regurgitation is not visualized. No aortic stenosis is present. Aortic valve mean gradient measures 3.3 mmHg. Aortic valve peak gradient measures 6.8 mmHg. Aortic valve area, by VTI measures 2.91 cm. Pulmonic Valve: The pulmonic valve was not well visualized. Pulmonic valve regurgitation is mild. No evidence of pulmonic stenosis. Aorta: The aortic root and ascending aorta are structurally normal, with no evidence of dilitation. Venous: The inferior vena cava is normal in size with greater than 50% respiratory variability, suggesting right atrial pressure of 3 mmHg. IAS/Shunts: No atrial level shunt detected by color flow Doppler.  LEFT VENTRICLE PLAX 2D LVIDd:         4.60 cm   Diastology LVIDs:         3.40 cm   LV e' medial:    11.00 cm/s LV PW:         1.00 cm   LV E/e' medial:  4.7 LV IVS:        1.00 cm   LV e' lateral:   13.30 cm/s LVOT diam:     2.20 cm   LV E/e' lateral: 3.9 LV SV:         59 LV SV Index:   26 LVOT Area:     3.80 cm  RIGHT VENTRICLE             IVC RV S prime:     10.10 cm/s  IVC diam: 2.00 cm TAPSE (M-mode): 1.8 cm LEFT ATRIUM           Index        RIGHT ATRIUM            Index LA diam:      2.80 cm 1.23 cm/m   RA Area:     11.70 cm LA Vol (A2C): 24.9 ml 10.93 ml/m  RA Volume:   22.00 ml  9.66 ml/m LA Vol (A4C): 43.0 ml 18.88 ml/m  AORTIC VALVE AV Area (Vmax):    2.90 cm AV Area (Vmean):   2.95 cm AV Area (VTI):     2.91 cm AV Vmax:           130.68 cm/s AV Vmean:          82.202 cm/s AV VTI:            0.203 m AV Peak Grad:      6.8 mmHg AV Mean Grad:      3.3 mmHg LVOT Vmax:         99.70 cm/s LVOT Vmean:        63.900 cm/s LVOT VTI:          0.155 m LVOT/AV VTI ratio: 0.77  AORTA Ao Root diam: 3.20 cm Ao Asc diam:  3.50 cm MITRAL VALVE MV Area (PHT): 3.47 cm    SHUNTS MV Decel Time: 219 msec    Systemic VTI:  0.16 m MV E velocity: 51.85 cm/s  Systemic Diam: 2.20 cm MV A velocity:  46.30 cm/s MV E/A ratio:  1.12 Dorn Ross MD Electronically signed by Dorn Ross MD Signature Date/Time: 05/09/2024/1:03:15 PM    Final    CT Angio Chest PE W and/or Wo Contrast Result Date: 05/08/2024 CLINICAL DATA:  Pulmonary embolus suspected with low to intermediate probability. Positive D-dimer. Chest pain. EXAM: CT ANGIOGRAPHY CHEST WITH CONTRAST TECHNIQUE: Multidetector CT imaging of the chest was performed using the standard protocol during bolus administration of intravenous contrast. Multiplanar CT image reconstructions and MIPs were obtained to evaluate the vascular anatomy. RADIATION DOSE REDUCTION: This exam was performed according to the departmental dose-optimization program which includes automated exposure control, adjustment of the mA and/or kV according to patient size and/or use of iterative reconstruction technique. CONTRAST:  80mL OMNIPAQUE  IOHEXOL  350 MG/ML SOLN COMPARISON:  Chest radiograph 05/08/2024.  CT chest 04/25/2024 FINDINGS: Cardiovascular: Technically adequate study with good opacification of the central and segmental pulmonary arteries. Mild motion artifact. Multiple filling defects are demonstrated within the distal main pulmonary arteries with  extension into bilateral upper and lower lobe segmental pulmonary arteries. Compared with the previous study, there is significant increase in the clot burden and in the number of pulmonary arteries involved. This is consistent with progression of pulmonary embolus. Mild cardiac enlargement. RV to LV ratio is increased at 1.1 suggesting evidence of right heart strain. This also has progressed since the prior study. Normal caliber thoracic aorta. Great vessel origins are patent. No aortic dissection. Mediastinum/Nodes: No enlarged mediastinal, hilar, or axillary lymph nodes. Thyroid gland, trachea, and esophagus demonstrate no significant findings. Lungs/Pleura: Patchy areas of airspace consolidation in the lungs, mostly peripheral. This is progressing since prior study and may indicate progressive pulmonary infarcts. Infectious or inflammatory process could also have this appearance. Follow-up after resolution of acute process to exclude underlying pulmonary nodules. Upper Abdomen: Surgical absence of the gallbladder. No acute abnormalities. Musculoskeletal: No acute bony abnormalities. Review of the MIP images confirms the above findings. IMPRESSION: 1. Positive examination for multiple bilateral pulmonary emboli. There is progression of clot burden since previous study. CT evidence of right heart strain with RV to LV ratio 1.1, also representing progression since previous study. 2. Patchy areas of airspace consolidation in the lungs, mostly in the periphery, progressing since prior study. This likely represents progressive pulmonary infarcts but infectious or inflammatory process could also have this appearance. Follow-up after resolution of acute process recommended to exclude underlying pulmonary nodules. Critical Value/emergent results were called by telephone at the time of interpretation on 05/08/2024 at 9:43 pm to provider WHITNEY PLUNKETT , who verbally acknowledged these results. Electronically Signed   By:  Elsie Gravely M.D.   On: 05/08/2024 21:56   DG Chest 2 View Result Date: 05/08/2024 CLINICAL DATA:  Chest pain.  Left rib pain. EXAM: CHEST - 2 VIEW COMPARISON:  09/24/2021 FINDINGS: Heart size and pulmonary vascularity are normal. Linear scarring in the lung bases. No pleural effusion or pneumothorax. No consolidation or airspace disease in the lungs. Mediastinal contours appear intact. Visualized ribs are nondisplaced. IMPRESSION: Scarring in the lung bases. No evidence of active pulmonary disease. Electronically Signed   By: Elsie Gravely M.D.   On: 05/08/2024 18:57   CT Angio Chest PE W and/or Wo Contrast Result Date: 04/25/2024 CLINICAL DATA:  Recurrent pulmonary embolism, chest pain, dyspnea, dizziness EXAM: CT ANGIOGRAPHY CHEST WITH CONTRAST TECHNIQUE: Multidetector CT imaging of the chest was performed using the standard protocol during bolus administration of intravenous contrast. Multiplanar CT image reconstructions and MIPs were obtained  to evaluate the vascular anatomy. RADIATION DOSE REDUCTION: This exam was performed according to the departmental dose-optimization program which includes automated exposure control, adjustment of the mA and/or kV according to patient size and/or use of iterative reconstruction technique. CONTRAST:  75mL OMNIPAQUE  IOHEXOL  350 MG/ML SOLN COMPARISON:  03/29/2024 FINDINGS: Cardiovascular: There is slightly suboptimal opacification of the pulmonary arterial tree, however, the examination is still diagnostic. Multiple intraluminal filling defects persist in keeping with chronic pulmonary embolism, slightly improved since prior examination in keeping partial clot lysis. No new filling defects are identified to suggest recurrent pulmonary embolism. Stable borderline enlargement of the central pulmonary arteries in keeping with changes of pulmonary arterial hypertension. Cardiac size is mildly enlarged, however, global cardiac size has decreased since prior  examination and there is no further CT evidence of right heart strain. No pericardial effusion. The thoracic aorta is unremarkable. Mediastinum/Nodes: No enlarged mediastinal, hilar, or axillary lymph nodes. Thyroid gland, trachea, and esophagus demonstrate no significant findings. Lungs/Pleura: Bibasilar atelectasis. No confluent pulmonary infiltrate. No pneumothorax or pleural effusion. No central obstructing lesion. Upper Abdomen: No acute abnormality. Musculoskeletal: No chest wall abnormality. No acute or significant osseous findings. Review of the MIP images confirms the above findings. IMPRESSION: 1. Slight interval improvement in chronic pulmonary embolism. No new filling defects are identified to suggest recurrent pulmonary embolism. 2. Stable borderline enlargement of the central pulmonary arteries in keeping with changes of pulmonary arterial hypertension. 3. Mild cardiomegaly persists. However, global cardiac size has decreased since prior examination and there is no further CT evidence of right heart strain. Electronically Signed   By: Dorethia Molt M.D.   On: 04/25/2024 03:54

## 2024-05-11 ENCOUNTER — Ambulatory Visit: Payer: Self-pay

## 2024-05-11 LAB — ANTI-PARIETAL ANTIBODY: Parietal Cell Antibody-IgG: 5.4 U (ref 0.0–20.0)

## 2024-05-11 LAB — INTRINSIC FACTOR ANTIBODIES: Intrinsic Factor: 19.6 [AU]/ml — ABNORMAL HIGH (ref 0.0–1.1)

## 2024-05-11 NOTE — Telephone Encounter (Signed)
 FYI Only or Action Required?: FYI only for provider.  Patient is followed in Pulmonology for New Pt.  Called Nurse Triage reporting Shortness of Breath and Referral.  Symptoms began several months ago.  Interventions attempted: Nothing.  Symptoms are: unchanged.  Triage Disposition: Go to ED Now (or PCP Triage)  Patient/caregiver understands and will follow disposition?: Unsure        Copied from CRM #[id number]. Topic: Clinical - Red Word Triage >> May 11, 2024 11:52 AM Rilla NOVAK wrote: [facility] that prompted transfer to Nurse Triage: Weakness, shortness of breath   ----------------------------------------------------------------------- From previous Reason for Contact - Scheduling: Patient/patient representative is calling to schedule an appointment. Refer to attachments for appointment information. Reason for Disposition  Sounds like a serious complication to the triager  Answer Assessment - Initial Assessment Questions 1. MAIN CONCERN OR SYMPTOM:  What is your main concern right now? What question do you have? What's the main symptom you're worried about? (e.g., breathing difficulty, ankle swelling, weight gain.)     Needing to est with [facility] - pt reports she needs to est care to get Rx for lovenox . Per pt, she was recently started on Eliquis , that caused her to have more blood clots-- recent hospitalization visit recs to change to lovenox  INJ 2. ONSET: When did the  sx  start?     X 1 month 3. BETTER-SAME-WORSE: Are you getting better, staying the same, or getting worse compared to the day you were discharged?     I feel OK - same Per chart left AMA 4. HOSPITALIZATION: How long were you hospitalized? (e.g., days)     05/08/2024 - 05/10/2024 5. DISCHARGE DIAGNOSIS:  What problem or disease were you hospitalized for?     PE 6. DISCHARGE DATE: What date were you discharged from the hospital?     See above 7. DISCHARGE DOCTOR: Who is the main doctor  taking care of you now?     See chart 8. DISCHARGE APPOINTMENT: Have you scheduled a follow-up discharge appointment with your doctor?     N/a 9. DISCHARGE MEDICINES: Did the doctor (or NP/PA) who discharged you order any new medicines for you to use? If yes, have you filled the prescription and started taking the medicine?      Pt left AMA, and did not receive any Rx 10. PAIN: Is there any pain? If Yes, ask: How bad is it?  (Scale 0-10; or none, mild, moderate, severe)       A little bit 11. FEVER: Do you have a fever? If Yes, ask: What is it, how was it measured  and when did it start?       denies 12. OTHER SYMPTOMS: Do you have any other symptoms?       denies    Pt initially calling to schedule New Pt appt with [facility] s/p hospitalization for PE. Upon further investigation, pt left AMA from hospital yesterday. Pt described that she was on a heparin  drip and awaiting heparin  levels to start Lovenox  bridge. Pt reports that she is supposed to be on a Lovenox  INJ and calling [facility] so that she can resume anti-coagulation outpatient.   Triager thoroughly explained that pt needed to go back to the hospital as it was unsafe for her to manage her diagnosis outpatient. Patient verbalized understanding to go back to the ED and disconnected call.  Protocols used: Post-Hospitalization Follow-up Call-A-AH

## 2024-05-12 ENCOUNTER — Emergency Department (HOSPITAL_COMMUNITY)

## 2024-05-12 ENCOUNTER — Emergency Department (HOSPITAL_COMMUNITY)
Admission: EM | Admit: 2024-05-12 | Discharge: 2024-05-12 | Disposition: A | Discharge status: Home / Self Care | Attending: Emergency Medicine | Admitting: Emergency Medicine

## 2024-05-12 ENCOUNTER — Other Ambulatory Visit: Payer: Self-pay

## 2024-05-12 ENCOUNTER — Encounter (HOSPITAL_COMMUNITY): Payer: Self-pay

## 2024-05-12 DIAGNOSIS — Z760 Encounter for issue of repeat prescription: Secondary | ICD-10-CM | POA: Diagnosis not present

## 2024-05-12 DIAGNOSIS — I2699 Other pulmonary embolism without acute cor pulmonale: Secondary | ICD-10-CM | POA: Insufficient documentation

## 2024-05-12 DIAGNOSIS — Z0279 Encounter for issue of other medical certificate: Secondary | ICD-10-CM | POA: Diagnosis present

## 2024-05-12 DIAGNOSIS — R791 Abnormal coagulation profile: Secondary | ICD-10-CM | POA: Insufficient documentation

## 2024-05-12 LAB — CBC WITH DIFFERENTIAL/PLATELET
Abs Immature Granulocytes: 0.02 K/uL (ref 0.00–0.07)
Basophils Absolute: 0 K/uL (ref 0.0–0.1)
Basophils Relative: 0 %
Eosinophils Absolute: 0.1 K/uL (ref 0.0–0.5)
Eosinophils Relative: 1 %
HCT: 33.1 % — ABNORMAL LOW (ref 36.0–46.0)
Hemoglobin: 11.2 g/dL — ABNORMAL LOW (ref 12.0–15.0)
Immature Granulocytes: 0 %
Lymphocytes Relative: 31 %
Lymphs Abs: 2.1 K/uL (ref 0.7–4.0)
MCH: 34 pg (ref 26.0–34.0)
MCHC: 33.8 g/dL (ref 30.0–36.0)
MCV: 100.6 fL — ABNORMAL HIGH (ref 80.0–100.0)
Monocytes Absolute: 0.4 K/uL (ref 0.1–1.0)
Monocytes Relative: 6 %
Neutro Abs: 4.2 K/uL (ref 1.7–7.7)
Neutrophils Relative %: 62 %
Platelets: 134 K/uL — ABNORMAL LOW (ref 150–400)
RBC: 3.29 MIL/uL — ABNORMAL LOW (ref 3.87–5.11)
RDW: 13.5 % (ref 11.5–15.5)
WBC: 6.9 K/uL (ref 4.0–10.5)
nRBC: 0 % (ref 0.0–0.2)

## 2024-05-12 LAB — COMPREHENSIVE METABOLIC PANEL WITH GFR
ALT: 16 U/L (ref 0–44)
AST: 13 U/L — ABNORMAL LOW (ref 15–41)
Albumin: 3.4 g/dL — ABNORMAL LOW (ref 3.5–5.0)
Alkaline Phosphatase: 83 U/L (ref 38–126)
Anion gap: 10 (ref 5–15)
BUN: 6 mg/dL (ref 6–20)
CO2: 23 mmol/L (ref 22–32)
Calcium: 9.1 mg/dL (ref 8.9–10.3)
Chloride: 106 mmol/L (ref 98–111)
Creatinine, Ser: 1.03 mg/dL — ABNORMAL HIGH (ref 0.44–1.00)
GFR, Estimated: >60 mL/min (ref >60)
Glucose, Bld: 94 mg/dL (ref 70–99)
Potassium: 3.5 mmol/L (ref 3.5–5.1)
Sodium: 139 mmol/L (ref 135–145)
Total Bilirubin: 0.9 mg/dL (ref 0.0–1.2)
Total Protein: 6.6 g/dL (ref 6.5–8.1)

## 2024-05-12 LAB — APTT: aPTT: 35 s (ref 24–36)

## 2024-05-12 LAB — TROPONIN I (HIGH SENSITIVITY)
Troponin I (High Sensitivity): 11 ng/L (ref <18)
Troponin I (High Sensitivity): 11 ng/L (ref <18)

## 2024-05-12 LAB — PROTIME-INR
INR: 1.2 (ref 0.8–1.2)
Prothrombin Time: 15.5 s — ABNORMAL HIGH (ref 11.4–15.2)

## 2024-05-12 LAB — HCG, SERUM, QUALITATIVE: Preg, Serum: NEGATIVE

## 2024-05-12 MED ORDER — ENOXAPARIN SODIUM 120 MG/0.8ML IJ SOSY: 120.0000 mg | PREFILLED_SYRINGE | Freq: Two times a day (BID) | INTRAMUSCULAR | 0 refills | Status: DC

## 2024-05-12 MED ORDER — ENOXAPARIN SODIUM 120 MG/0.8ML IJ SOSY
1.0000 mg/kg | PREFILLED_SYRINGE | Freq: Once | INTRAMUSCULAR | Status: AC
  Administered 2024-05-12: 120 mg via SUBCUTANEOUS
  Filled 2024-05-12: qty 0.8

## 2024-05-12 NOTE — ED Triage Notes (Signed)
 Pt reports procedure for blood clots on right side of neck this weekend. Pt states she needs return instructions for work and supposed to be on lovenox  but left without. Denies pain.

## 2024-05-12 NOTE — Discharge Instructions (Addendum)
 Follow-up with your primary care doctor as planned.  You are likely going to need B12 shots also.  Use the Lovenox  twice a day.

## 2024-05-12 NOTE — ED Provider Triage Note (Signed)
 Emergency Medicine Provider Triage Evaluation Note  Denise Santana , a 43 y.o. female  was evaluated in triage.  Pt complains of back pain and chest pain, was recently admitted for bilateral PE and signed out prior to getting lovenox  prescription and work return precautions. Here today wondering about lovenox  and return precautions but still endorses chest and back pain. Previously on eliquis  however was supposed to start lovenox  but left hospital prior to prescription, denies taking any blood thinner since hospitalization. Talking in full sentences on room air. Denies hemoptysis, fever, chills, abdominal pain, nausea, vomiting.   Review of Systems  Positive: Chest pain, back pain Negative: Hemoptysis, abdominal pain, nausea, vomiting, fever, chills  Physical Exam  BP 125/76   Pulse 90   Temp 98.7 F (37.1 C)   Resp 18   Ht 5' 7 (1.702 m)   Wt 119.7 kg   SpO2 96%   BMI 41.35 kg/m  Gen:   Awake, no distress   Resp:  Normal effort  MSK:   Moves extremities without difficulty  Other:  No obvious abnormality with auscultation of heart of lungs   Medical Decision Making  Medically screening exam initiated at 12:59 PM.  Appropriate orders placed.  [name] was informed that the remainder of the evaluation will be completed by another provider, this initial triage assessment does not replace that evaluation, and the importance of remaining in the ED until their evaluation is complete.  Orders: CBC, CMP, Troponin, EKG, preg, PT/INR, PTT, CXR    [name], PA-C 05/12/24 1302

## 2024-05-12 NOTE — ED Provider Notes (Signed)
 [place] EMERGENCY DEPARTMENT AT [facility] Provider Note   CSN: [id number] Arrival date & time: 05/12/24  1156     Patient presents with: No chief complaint on file.   Denise Santana is a 43 y.o. female.   HPI Patient with history of DVT and PE.  Left AMA from hospital 2 days ago.  Was post to get Lovenox  prescription.  States she did not like the IV she had did not feel as if she was getting answers.  Returns today.  States she needs a work note and prescription for Lovenox .  States she does have follow-up with PCP in this next month.  States she is feeling fine.  Past Medical History:  Diagnosis Date   DVT (deep venous thrombosis) (HCC)     Prior to Admission medications   Medication Sig Start Date End Date Taking? Authorizing Provider  enoxaparin  (LOVENOX ) 120 MG/0.8ML injection Inject 0.8 mLs (120 mg total) into the skin every 12 (twelve) hours. 05/12/24  Yes Patsey Lot, MD  acetaminophen  (TYLENOL ) 325 MG tablet Take 2 tablets (650 mg total) by mouth every 6 (six) hours. 03/30/24   Gomes, Adriana, DO    Allergies: Patient has no known allergies.    Review of Systems  Updated Vital Signs BP 116/71   Pulse 88   Temp 98.6 F (37 C) (Oral)   Resp 18   Ht 5' 7 (1.702 m)   Wt 119.7 kg   SpO2 99%   BMI 41.35 kg/m   Physical Exam Vitals and nursing note reviewed.  HENT:     Head:     Comments: Dressing on neck from previous access. Pulmonary:     Breath sounds: No wheezing or rhonchi.  Abdominal:     Tenderness: There is no abdominal tenderness.  Neurological:     Mental Status: She is alert.     (all labs ordered are listed, but only abnormal results are displayed) Labs Reviewed  CBC WITH DIFFERENTIAL/PLATELET - Abnormal; Notable for the following components:      Result Value   RBC 3.29 (*)    Hemoglobin 11.2 (*)    HCT 33.1 (*)    MCV 100.6 (*)    Platelets 134 (*)    All other components within normal limits  COMPREHENSIVE METABOLIC  PANEL WITH GFR - Abnormal; Notable for the following components:   Creatinine, Ser 1.03 (*)    Albumin  3.4 (*)    AST 13 (*)    All other components within normal limits  PROTIME-INR - Abnormal; Notable for the following components:   Prothrombin Time 15.5 (*)    All other components within normal limits  HCG, SERUM, QUALITATIVE  APTT  TROPONIN I (HIGH SENSITIVITY)  TROPONIN I (HIGH SENSITIVITY)    EKG: None  Radiology: DG Chest 2 View Result Date: 05/12/2024 CLINICAL DATA:  Shortness of breath. EXAM: CHEST - 2 VIEW COMPARISON:  Chest x-ray and CT chest 05/08/2024. FINDINGS: Trachea is midline. Heart is enlarged. Streaky densities in the lung bases. Nodular areas of consolidation seen in the posterior segment right upper lobe on 05/08/2024 are not well visualized. IMPRESSION: Bibasilar atelectasis. Electronically Signed   By: Newell Eke M.D.   On: 05/12/2024 13:48     Procedures   Medications Ordered in the ED  enoxaparin  (LOVENOX ) injection 120 mg (has no administration in time range)  Medical Decision Making Risk Prescription drug management.   Patient with recent admission to the hospital left AMA.  Revealing notes it appears that she was post to get Lovenox  shots.  1 mg/kg twice a day.  Will write for this.  Discussed with pharmacy to verify dose.  Also reviewed notes.  She is likely to need B12 injections since she had a positive intrinsic factor antibody.  However her PCP can follow this.  Will discharge home.      Final diagnoses:  Acute pulmonary embolism, unspecified pulmonary embolism type, unspecified whether acute cor pulmonale present [facility])    ED Discharge Orders          Ordered    enoxaparin  (LOVENOX ) 120 MG/0.8ML injection  Every 12 hours        05/12/24 1833               Patsey Lot, MD 05/12/24 1836

## 2024-05-12 NOTE — ED Notes (Signed)
 Pt able to do a returned demonstrate  of Lovenox  administration successfully

## 2024-05-12 NOTE — ED Notes (Signed)
 Pt given discharge instructions and was able to do a return demonstration of Lovenox  administration prior to discharge. Verbalized understanding of instructions.

## 2024-05-13 ENCOUNTER — Ambulatory Visit: Payer: Self-pay

## 2024-05-13 ENCOUNTER — Other Ambulatory Visit: Payer: Self-pay

## 2024-05-13 LAB — ANTIPHOSPHOLIPID SYNDROME EVAL, BLD
Anticardiolipin IgA: <9 U/mL (ref 0–11)
Anticardiolipin IgG: <9 GPL U/mL (ref 0–14)
Anticardiolipin IgM: <9 [MPL'U]/mL (ref 0–12)
DRVVT: 42.7 s (ref 0.0–47.0)
PTT Lupus Anticoagulant: 41.7 s (ref 0.0–43.5)
Phosphatydalserine, IgA: <1 {APS'U} (ref 0–3)
Phosphatydalserine, IgG: <9 U (ref 0–30)
Phosphatydalserine, IgM: <10 U (ref 0–30)

## 2024-05-13 NOTE — Telephone Encounter (Signed)
 Spoke with pt and she did not get a B12 inj at the hospital.  She would like to start getting them here.  She does not currently have a PCP but does have an appt set up for 9/18

## 2024-05-13 NOTE — Telephone Encounter (Signed)
 LM for pt to call the office to discuss Vitamin B12 injections and possible f/up appt with Dr Tina. Also LM with her mother

## 2024-05-13 NOTE — Telephone Encounter (Signed)
-----   Message from [name] sent at 05/13/2024 10:13 AM EDT ----- Sherrilyn This was an inpatient finding of B12 deficiency.  Her B12 was pretty low with antibody preventing her from absorbing.  Would you make sure she got her B12 injection reported given by ED physician.   She should do B12 once daily for 7 days, followed by weekly for 4 times, and then monthly.  If she cannot give it to herself, would you let her come in and we can give it to her.  And I can have her  follow-up with her as well. ----- Message ----- From: Interface, Lab In [place] Sent: 05/11/2024   8:36 AM EDT To: [name], MD

## 2024-05-13 NOTE — Telephone Encounter (Signed)
-----   Message from [name] sent at 05/13/2024 10:13 AM EDT ----- Denise Santana This was an inpatient finding of B12 deficiency.  Her B12 was pretty low with antibody preventing her from absorbing.  Would you make sure she got her B12 injection reported given by ED physician.   She should do B12 once daily for 7 days, followed by weekly for 4 times, and then monthly.  If she cannot give it to herself, would you let her come in and we can give it to her.  And I can have her  follow-up with her as well. ----- Message ----- From: Interface, Lab In [place] Sent: 05/11/2024   8:36 AM EDT To: [name], MD

## 2024-05-13 NOTE — Progress Notes (Signed)
 Start B12 with 7 daily doses, followed by weekly x 4, followed by monthly.  Schedule follow-up with me on 9/2 next available openings or other days at patient's request.

## 2024-05-18 ENCOUNTER — Ambulatory Visit

## 2024-05-18 VITALS — BP 111/71 | HR 75 | Temp 98.0°F | Ht 67.0 in | Wt 265.1 lb

## 2024-05-18 DIAGNOSIS — F32A Depression, unspecified: Secondary | ICD-10-CM | POA: Diagnosis not present

## 2024-05-18 DIAGNOSIS — Z1231 Encounter for screening mammogram for malignant neoplasm of breast: Secondary | ICD-10-CM

## 2024-05-18 DIAGNOSIS — I2699 Other pulmonary embolism without acute cor pulmonale: Secondary | ICD-10-CM | POA: Diagnosis not present

## 2024-05-18 DIAGNOSIS — F419 Anxiety disorder, unspecified: Secondary | ICD-10-CM | POA: Diagnosis not present

## 2024-05-18 DIAGNOSIS — Z7901 Long term (current) use of anticoagulants: Secondary | ICD-10-CM

## 2024-05-18 DIAGNOSIS — Z1329 Encounter for screening for other suspected endocrine disorder: Secondary | ICD-10-CM

## 2024-05-18 DIAGNOSIS — F17200 Nicotine dependence, unspecified, uncomplicated: Secondary | ICD-10-CM

## 2024-05-18 DIAGNOSIS — Z130 Encounter for screening for diseases of the blood and blood-forming organs and certain disorders involving the immune mechanism: Secondary | ICD-10-CM

## 2024-05-18 DIAGNOSIS — E538 Deficiency of other specified B group vitamins: Secondary | ICD-10-CM

## 2024-05-18 DIAGNOSIS — N179 Acute kidney failure, unspecified: Secondary | ICD-10-CM

## 2024-05-18 DIAGNOSIS — Z7689 Persons encountering health services in other specified circumstances: Secondary | ICD-10-CM

## 2024-05-18 MED ORDER — SERTRALINE HCL 50 MG PO TABS: 50.0000 mg | ORAL_TABLET | Freq: Every day | ORAL | 2 refills | Status: DC

## 2024-05-18 MED ORDER — RIVAROXABAN 20 MG PO TABS: 20.0000 mg | ORAL_TABLET | Freq: Every day | ORAL | 2 refills | Status: DC

## 2024-05-18 MED ORDER — RIVAROXABAN 15 MG PO TABS: 15.0000 mg | ORAL_TABLET | Freq: Two times a day (BID) | ORAL | 0 refills | Status: DC

## 2024-05-18 MED ORDER — HYDROXYZINE PAMOATE 50 MG PO CAPS: 50.0000 mg | ORAL_CAPSULE | Freq: Every evening | ORAL | 0 refills | Status: DC | PRN

## 2024-05-18 NOTE — Patient Instructions (Addendum)
 VISIT SUMMARY: Today, you came in for a follow-up visit after your recent hospitalization for a pulmonary embolism and deep vein thrombosis. We discussed your symptoms, including leg pain and swelling, and reviewed your current health maintenance needs.  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 Deep vein thrombosis (DVT) is a condition where blood clots form in the deep veins of your legs. We will start you on Xarelto , an alternative blood thinner, and refer you to cardiology for further management. We will also consider a referral to hematology to evaluate your risk of clotting and order blood work to check your blood counts and other relevant parameters. If Xarelto  is too expensive, we can discuss financial assistance options. Please take the Xarelto  as follows:  15 mg twice per day for 21 days, THEN  20 mg once per day with dinner thereafter.   -LEG PAIN AND SWELLING ASSOCIATED WITH VENOUS THROMBOEMBOLISM: Your leg pain and swelling are due to the previous blood clots in your legs. You can take Tylenol  for pain relief, but do not exceed 3000 mg per day. We will also check your liver function to ensure it is safe for you to take Tylenol .  -VITAMIN B12 DEFICIENCY: Vitamin B12 deficiency means you have low levels of this essential vitamin, which can affect your nerve function and overall health. We will check your B12 levels and get you set up for B12 injections monthly.   -MAJOR DEPRESSIVE DISORDER AND ANXIETY DISORDER: Major depressive disorder is a condition characterized by persistent feelings of sadness and loss of interest, while anxiety disorder involves excessive worry and fear. We will start you on Zoloft , beginning with 25 mg for the first 7 days and then increasing to 50 mg. Hydroxyzine  will be  prescribed as needed for anxiety and sleep. Please note that Zoloft  may take 4-6 weeks to show its full effect. We also discussed the possibility of future therapy.  -TOBACCO USE: You have a long history of smoking, which increases your risk for lung cancer and other health issues. We have documented your smoking history for future lung cancer screening when you turn 51.  -PREVENTIVE HEALTH MAINTENANCE: You are due for several preventive health measures, including a Pap smear, mammogram, and tetanus and influenza vaccines. We recommend scheduling these as soon as possible to maintain your overall health.  INSTRUCTIONS: Please follow up with cardiology for anticoagulation management and B12 injections. Schedule a hematology consultation to evaluate your clotting risk. Get your blood work done as ordered. Start taking Zoloft  as prescribed and use hydroxyzine  as needed. Schedule your Pap smear, mammogram, and vaccines. If you have any questions or concerns, please contact our office.  If you have any problems before your next visit feel free to message me via MyChart (minor issues or questions) or call the office, otherwise you may reach out to schedule an office visit.  Thank you! Saddie Sacks, PA-C

## 2024-05-18 NOTE — Progress Notes (Unsigned)
 New Patient Office Visit  Subjective    Patient ID: Denise Santana, female    DOB: 12-29-1980  Age: 43 y.o. MRN: [id number]  CC: No chief complaint on file.   Discussed the use of AI scribe software for clinical note transcription with the patient, who gave verbal consent to proceed.  History of Present Illness     HPI Denise Santana presents to establish care. Prior PCP was *** at ***. Last physical was ***. she notes that she requires refills of ***.  Screenings:  Colon Cancer:  Lung Cancer:  Breast Cancer:  Diabetes: *** Ophthalmology*** Foot*** HLD: ***  The 10-year ASCVD risk score (Arnett DK, et al., 2019) is: 0.9% Hep B Vax: ***  Acute Problems: ***  Outpatient Encounter Medications as of 05/18/2024  Medication Sig  . acetaminophen  (TYLENOL ) 325 MG tablet Take 2 tablets (650 mg total) by mouth every 6 (six) hours.  . enoxaparin  (LOVENOX ) 120 MG/0.8ML injection Inject 0.8 mLs (120 mg total) into the skin every 12 (twelve) hours. (Patient not taking: Reported on 05/18/2024)   No facility-administered encounter medications on file as of 05/18/2024.    Past Medical History:  Diagnosis Date  . DVT (deep venous thrombosis) (HCC)     Past Surgical History:  Procedure Laterality Date  . APPENDECTOMY    . IR ANGIOGRAM PULMONARY BILATERAL SELECTIVE  05/09/2024  . IR ANGIOGRAM SELECTIVE EACH ADDITIONAL VESSEL  05/09/2024  . IR ANGIOGRAM SELECTIVE EACH ADDITIONAL VESSEL  05/09/2024  . IR INFUSION THROMBOL ARTERIAL INITIAL (MS)  05/09/2024  . IR INFUSION THROMBOL ARTERIAL INITIAL (MS)  05/09/2024  . IR IVC FILTER PLMT / S&I /IMG GUID/MOD SED  05/09/2024  . IR THROMB F/U EVAL ART/VEN FINAL DAY (MS)  05/10/2024  . IR US  GUIDE VASC ACCESS RIGHT  05/09/2024  . KIDNEY SURGERY      Family History  Problem Relation Age of Onset  . Hypertension Mother   . Hyperlipidemia Mother   . Diabetes Mother   . Heart failure Mother   . Heart attack Maternal Grandmother     Social History    Socioeconomic History  . Marital status: Single    Spouse name: Not on file  . Number of children: 4  . Years of education: Not on file  . Highest education level: Not on file  Occupational History  . Occupation: PCA/ [employer]  Tobacco Use  . Smoking status: Every Day    Current packs/day: 1.00    Types: Cigarettes  . Smokeless tobacco: Never  Vaping Use  . Vaping status: Never Used  Substance and Sexual Activity  . Alcohol use: Never  . Drug use: Never  . Sexual activity: Yes  Other Topics Concern  . Not on file  Social History Narrative   Caffeine Intake: __0_   cups per day   Weight:    Are you satisfied with your weight? YES   Diet: How do you rate your diet?GOOD   Do you eat or drink 4 servings of daily or soy daily or take calcium supplements?NO   Exercise:    Do you exercise regularly?NO   What type of exercise? N/A   How long(minutes)? N/A   How often? N/A   Safety:    Do you wear a bike helmet? YES   Do you use your seatbelts constantly?YES   Is violence at home a concern for you?NO   Have you ever been abused?NO   Do you have a gun  in your home?NO   Social Drivers of Corporate investment banker Strain: Not on file  Food Insecurity: No Food Insecurity (05/09/2024)   Hunger Vital Sign   . Worried About Programme researcher, broadcasting/film/video in the Last Year: Never true   . Ran Out of Food in the Last Year: Never true  Transportation Needs: No Transportation Needs (05/09/2024)   PRAPARE - Transportation   . Lack of Transportation (Medical): No   . Lack of Transportation (Non-Medical): No  Physical Activity: Not on file  Stress: Not on file  Social Connections: Not on file  Intimate Partner Violence: Not At Risk (05/09/2024)   Humiliation, Afraid, Rape, and Kick questionnaire   . Fear of Current or Ex-Partner: No   . Emotionally Abused: No   . Physically Abused: No   . Sexually Abused: No    ROS  Per HPI      Objective    BP 111/71   Pulse  75   Temp 98 F (36.7 C) (Oral)   Ht 5' 7 (1.702 m)   Wt 265 lb 1.9 oz (120.3 kg)   LMP 05/07/2024   SpO2 96%   BMI 41.52 kg/m   Physical Exam  {Labs (Optional):23779}    Assessment & Plan:   There are no diagnoses linked to this encounter.  Assessment and Plan Assessment & Plan      No follow-ups on file.   Saddie JULIANNA Sacks, PA-C

## 2024-05-19 ENCOUNTER — Ambulatory Visit: Payer: Self-pay

## 2024-05-19 ENCOUNTER — Ambulatory Visit

## 2024-05-19 DIAGNOSIS — F419 Anxiety disorder, unspecified: Secondary | ICD-10-CM | POA: Insufficient documentation

## 2024-05-19 DIAGNOSIS — F17200 Nicotine dependence, unspecified, uncomplicated: Secondary | ICD-10-CM | POA: Insufficient documentation

## 2024-05-19 LAB — CBC WITH DIFFERENTIAL/PLATELET
Basophils Absolute: 0 x10E3/uL (ref 0.0–0.2)
Basos: 0 %
EOS (ABSOLUTE): 0.1 x10E3/uL (ref 0.0–0.4)
Eos: 1 %
Hematocrit: 35.2 % (ref 34.0–46.6)
Hemoglobin: 11.5 g/dL (ref 11.1–15.9)
Immature Grans (Abs): 0 x10E3/uL (ref 0.0–0.1)
Immature Granulocytes: 0 %
Lymphocytes Absolute: 2.9 x10E3/uL (ref 0.7–3.1)
Lymphs: 46 %
MCH: 33.4 pg — ABNORMAL HIGH (ref 26.6–33.0)
MCHC: 32.7 g/dL (ref 31.5–35.7)
MCV: 102 fL — ABNORMAL HIGH (ref 79–97)
Monocytes Absolute: 0.4 x10E3/uL (ref 0.1–0.9)
Monocytes: 7 %
Neutrophils Absolute: 2.9 x10E3/uL (ref 1.4–7.0)
Neutrophils: 46 %
Platelets: 225 x10E3/uL (ref 150–450)
RBC: 3.44 x10E6/uL — ABNORMAL LOW (ref 3.77–5.28)
RDW: 14.2 % (ref 11.7–15.4)
WBC: 6.3 x10E3/uL (ref 3.4–10.8)

## 2024-05-19 LAB — COMPREHENSIVE METABOLIC PANEL WITH GFR
ALT: 19 IU/L (ref 0–32)
AST: 13 IU/L (ref 0–40)
Albumin: 4.2 g/dL (ref 3.9–4.9)
Alkaline Phosphatase: 106 IU/L (ref 44–121)
BUN/Creatinine Ratio: 6 — ABNORMAL LOW (ref 9–23)
BUN: 7 mg/dL (ref 6–24)
Bilirubin Total: 0.5 mg/dL (ref 0.0–1.2)
CO2: 23 mmol/L (ref 20–29)
Calcium: 8.7 mg/dL (ref 8.7–10.2)
Chloride: 104 mmol/L (ref 96–106)
Creatinine, Ser: 1.15 mg/dL — ABNORMAL HIGH (ref 0.57–1.00)
Globulin, Total: 2.1 g/dL (ref 1.5–4.5)
Glucose: 97 mg/dL (ref 70–99)
Potassium: 3.8 mmol/L (ref 3.5–5.2)
Sodium: 141 mmol/L (ref 134–144)
Total Protein: 6.3 g/dL (ref 6.0–8.5)
eGFR: 61 mL/min/1.73 (ref >59)

## 2024-05-19 LAB — LIPID PANEL
Chol/HDL Ratio: 3.8 ratio (ref 0.0–4.4)
Cholesterol, Total: 145 mg/dL (ref 100–199)
HDL: 38 mg/dL — ABNORMAL LOW (ref >39)
LDL Chol Calc (NIH): 87 mg/dL (ref 0–99)
Triglycerides: 106 mg/dL (ref 0–149)
VLDL Cholesterol Cal: 20 mg/dL (ref 5–40)

## 2024-05-19 LAB — B12 AND FOLATE PANEL
Folate: 3.3 ng/mL (ref >3.0)
Vitamin B-12: 397 pg/mL (ref 232–1245)

## 2024-05-19 LAB — APTT: aPTT: 24 s (ref 24–33)

## 2024-05-19 LAB — TSH: TSH: 0.645 u[IU]/mL (ref 0.450–4.500)

## 2024-05-19 LAB — HEMOGLOBIN A1C
Est. average glucose Bld gHb Est-mCnc: 108 mg/dL
Hgb A1c MFr Bld: 5.4 % (ref 4.8–5.6)

## 2024-05-19 LAB — VITAMIN D 25 HYDROXY (VIT D DEFICIENCY, FRACTURES): Vit D, 25-Hydroxy: 25.5 ng/mL — ABNORMAL LOW (ref 30.0–100.0)

## 2024-05-19 NOTE — Progress Notes (Signed)
 New Patient Office Visit  Subjective    Patient ID: Christyann Manolis, female    DOB: Sep 14, 1981  Age: 43 y.o. MRN: [id number]  CC: No chief complaint on file.   Discussed the use of AI scribe software for clinical note transcription with the patient, who gave verbal consent to proceed.  History of Present Illness   Sheridan Hew is a 43 year old female who presents for follow-up after hospitalization for pulmonary embolism and deep vein thrombosis.  Venous thromboembolism symptoms and sequelae - Hospitalized from August 23rd to August 27th for pulmonary embolism and deep vein thrombosis - Has been hospitalized on 3 other occasions due to DVT and/or PE in the last 3 years. - Was previously followed by vascular surgery but was lost to follow up since last office visit on 09/19/2021. - No anticoagulation therapy since discharge due to cost of Lovenox  and adverse effects from Eliquis , which caused her to feel unwell. Is open to trying Xarelto  as replacement.  - No blood thinners taken since leaving the hospital - No regular use of pain medication; avoids Tylenol  due to concerns about liver effects  Tobacco use - Smokes one pack per week - Smoking since age 52  Mood disturbance - Low mood and lack of interest in activities - Attributes symptoms to feeling tired and overwhelmed - No history of treatment for depression or anxiety - No family history of depression - No suicidal ideation  Preventive health maintenance - No Pap smear since 2014 - Due for mammogram, order placed today - Due for tetanus and influenza vaccines; not yet received      Outpatient Encounter Medications as of 05/18/2024  Medication Sig   acetaminophen  (TYLENOL ) 325 MG tablet Take 2 tablets (650 mg total) by mouth every 6 (six) hours.   hydrOXYzine  (VISTARIL ) 50 MG capsule Take 1 capsule (50 mg total) by mouth at bedtime as needed.   Rivaroxaban  (XARELTO ) 15 MG TABS tablet Take 1 tablet (15 mg total) by mouth 2  (two) times daily with a meal.   [START ON 06/08/2024] rivaroxaban  (XARELTO ) 20 MG TABS tablet Take 1 tablet (20 mg total) by mouth daily with supper.   sertraline  (ZOLOFT ) 50 MG tablet Take 1 tablet (50 mg total) by mouth daily.   [DISCONTINUED] enoxaparin  (LOVENOX ) 120 MG/0.8ML injection Inject 0.8 mLs (120 mg total) into the skin every 12 (twelve) hours. (Patient not taking: Reported on 05/18/2024)   No facility-administered encounter medications on file as of 05/18/2024.    Past Medical History:  Diagnosis Date   DVT (deep venous thrombosis) (HCC)     Past Surgical History:  Procedure Laterality Date   APPENDECTOMY     IR ANGIOGRAM PULMONARY BILATERAL SELECTIVE  05/09/2024   IR ANGIOGRAM SELECTIVE EACH ADDITIONAL VESSEL  05/09/2024   IR ANGIOGRAM SELECTIVE EACH ADDITIONAL VESSEL  05/09/2024   IR INFUSION THROMBOL ARTERIAL INITIAL (MS)  05/09/2024   IR INFUSION THROMBOL ARTERIAL INITIAL (MS)  05/09/2024   IR IVC FILTER PLMT / S&I /IMG GUID/MOD SED  05/09/2024   IR THROMB F/U EVAL ART/VEN FINAL DAY (MS)  05/10/2024   IR US  GUIDE VASC ACCESS RIGHT  05/09/2024   KIDNEY SURGERY      Family History  Problem Relation Age of Onset   Hypertension Mother    Hyperlipidemia Mother    Diabetes Mother    Heart failure Mother    Heart attack Maternal Grandmother     Social History   Socioeconomic History   Marital  status: Single    Spouse name: Not on file   Number of children: 4   Years of education: Not on file   Highest education level: Not on file  Occupational History   Occupation: PCA/ [employer]  Tobacco Use   Smoking status: Every Day    Current packs/day: 1.00    Types: Cigarettes   Smokeless tobacco: Never  Vaping Use   Vaping status: Never Used  Substance and Sexual Activity   Alcohol use: Never   Drug use: Never   Sexual activity: Yes  Other Topics Concern   Not on file  Social History Narrative   Caffeine Intake: __0_   cups per day   Weight:    Are  you satisfied with your weight? YES   Diet: How do you rate your diet?GOOD   Do you eat or drink 4 servings of daily or soy daily or take calcium supplements?NO   Exercise:    Do you exercise regularly?NO   What type of exercise? N/A   How long(minutes)? N/A   How often? N/A   Safety:    Do you wear a bike helmet? YES   Do you use your seatbelts constantly?YES   Is violence at home a concern for you?NO   Have you ever been abused?NO   Do you have a gun in your home?NO   Social Drivers of Corporate investment banker Strain: Not on file  Food Insecurity: No Food Insecurity (05/09/2024)   Hunger Vital Sign    Worried About Running Out of Food in the Last Year: Never true    Ran Out of Food in the Last Year: Never true  Transportation Needs: No Transportation Needs (05/09/2024)   PRAPARE - Administrator, Civil Service (Medical): No    Lack of Transportation (Non-Medical): No  Physical Activity: Not on file  Stress: Not on file  Social Connections: Not on file  Intimate Partner Violence: Not At Risk (05/09/2024)   Humiliation, Afraid, Rape, and Kick questionnaire    Fear of Current or Ex-Partner: No    Emotionally Abused: No    Physically Abused: No    Sexually Abused: No    ROS  Per HPI      Objective    BP 111/71   Pulse 75   Temp 98 F (36.7 C) (Oral)   Ht 5' 7 (1.702 m)   Wt 265 lb 1.9 oz (120.3 kg)   LMP 05/07/2024   SpO2 96%   BMI 41.52 kg/m   Physical Exam Constitutional:      General: She is not in acute distress.    Appearance: Normal appearance.  Cardiovascular:     Rate and Rhythm: Normal rate and regular rhythm.     Heart sounds: Normal heart sounds. No murmur heard.    No friction rub. No gallop.  Pulmonary:     Effort: Pulmonary effort is normal. No respiratory distress.     Breath sounds: Normal breath sounds.  Musculoskeletal:     Right lower leg: Edema present.     Left lower leg: Edema present.  Skin:    General: Skin is  warm and dry.  Neurological:     General: No focal deficit present.     Mental Status: She is alert.  Psychiatric:        Mood and Affect: Mood normal.        Behavior: Behavior normal.  Thought Content: Thought content normal.          Assessment & Plan:   Recurrent pulmonary emboli [facility]) Assessment & Plan: Recent hospitalization for PE and DVT. Not on anticoagulation due to cost and side effects. Xarelto  proposed as alternative anticoagulant. - Prescribe Xarelto  as replacement due to cost. Advised the following Xarelto  schedule: 15 mg BID x 21 days, then 20 mg every day thereafter. Discussed the importance of her being on some form of anticoagulant given her history.  - Hematology referral for clotting risk evaluation. Antiphospholipid syndrome workup pending. - Will re-refer to vascular surgery for further recommendations.  - Order blood work for blood counts and relevant parameters. - Advise on financial assistance if Xarelto  is also too expensive.  Orders: -     CBC with Differential/Platelet -     APTT -     Rivaroxaban ; Take 1 tablet (15 mg total) by mouth 2 (two) times daily with a meal.  Dispense: 42 tablet; Refill: 0 -     Rivaroxaban ; Take 1 tablet (20 mg total) by mouth daily with supper.  Dispense: 30 tablet; Refill: 2 -     Ambulatory referral to Vascular Surgery -     Ambulatory referral to Hematology / Oncology  Screening mammogram for breast cancer -     3D Screening Mammogram, Left and Right; Future  B12 deficiency Assessment & Plan: Rechecking B12 level today. IF antibody positive, antiparietal antibody negative.  Pending next B12 level, will plan for long-term B12 injections. Appreciate further recommendations from hematology.  Orders: -     B12 and Folate Panel  Chronic anticoagulation -     CBC with Differential/Platelet -     APTT -     Rivaroxaban ; Take 1 tablet (15 mg total) by mouth 2 (two) times daily with a meal.  Dispense: 42 tablet;  Refill: 0 -     Rivaroxaban ; Take 1 tablet (20 mg total) by mouth daily with supper.  Dispense: 30 tablet; Refill: 2 -     Ambulatory referral to Vascular Surgery -     Ambulatory referral to Hematology / Oncology  AKI (acute kidney injury) (HCC) -     Comprehensive metabolic panel with GFR  Screening for endocrine, nutritional, metabolic and immunity disorder -     VITAMIN D  25 Hydroxy (Vit-D Deficiency, Fractures) -     TSH -     Hemoglobin A1c -     Lipid panel  Anxiety and depression Assessment & Plan: PHQ9: 17, GAD7: 12. - Prescribe Zoloft  50 mg, start with 25 mg for 7 days, then increase to 50 mg. - Prescribe hydroxyzine  as needed for anxiety and sleep. - Advise on Zoloft 's delayed onset, 4-6 weeks. - Discuss potential future therapy, however patient declines referral today.  - Will follow up in 2 months on mood and adjust medication as indicated.    Tobacco use disorder Assessment & Plan: Long-standing tobacco use since age 68, smoking a pack per week. Counseled on the importance of cessation given her DVT/PE hx.  - Document smoking history for future lung cancer screening at age 26.    Encounter to establish care  Other orders -     Sertraline  HCl; Take 1 tablet (50 mg total) by mouth daily.  Dispense: 30 tablet; Refill: 2 -     hydrOXYzine  Pamoate; Take 1 capsule (50 mg total) by mouth at bedtime as needed.  Dispense: 30 capsule; Refill: 0   Return in about 3 months (around  08/17/2024) for Mood, anticoagulation.   Saddie JULIANNA Sacks, PA-C

## 2024-05-19 NOTE — Assessment & Plan Note (Signed)
 Rechecking B12 level today. IF antibody positive, antiparietal antibody negative.  Pending next B12 level, will plan for long-term B12 injections. Appreciate further recommendations from hematology.

## 2024-05-19 NOTE — Assessment & Plan Note (Signed)
 Recent hospitalization for PE and DVT. Not on anticoagulation due to cost and side effects. Xarelto  proposed as alternative anticoagulant. - Prescribe Xarelto  as replacement due to cost. Advised the following Xarelto  schedule: 15 mg BID x 21 days, then 20 mg every day thereafter. Discussed the importance of her being on some form of anticoagulant given her history.  - Hematology referral for clotting risk evaluation. Antiphospholipid syndrome workup pending. - Will re-refer to vascular surgery for further recommendations.  - Order blood work for blood counts and relevant parameters. - Advise on financial assistance if Xarelto  is also too expensive.

## 2024-05-19 NOTE — Assessment & Plan Note (Signed)
 Long-standing tobacco use since age 43, smoking a pack per week. Counseled on the importance of cessation given her DVT/PE hx.  - Document smoking history for future lung cancer screening at age 30.

## 2024-05-19 NOTE — Assessment & Plan Note (Signed)
 PHQ9: 17, GAD7: 12. - Prescribe Zoloft  50 mg, start with 25 mg for 7 days, then increase to 50 mg. - Prescribe hydroxyzine  as needed for anxiety and sleep. - Advise on Zoloft 's delayed onset, 4-6 weeks. - Discuss potential future therapy, however patient declines referral today.  - Will follow up in 2 months on mood and adjust medication as indicated.

## 2024-05-24 ENCOUNTER — Telehealth: Payer: Self-pay

## 2024-05-24 NOTE — Telephone Encounter (Signed)
 Called to schedule appointments per referral message. Unable to make contact with the patient; therefore, left a VM for the patient to call back to schedule.

## 2024-05-25 ENCOUNTER — Other Ambulatory Visit (HOSPITAL_COMMUNITY): Payer: Self-pay

## 2024-05-26 ENCOUNTER — Other Ambulatory Visit (HOSPITAL_COMMUNITY): Payer: Self-pay

## 2024-05-26 ENCOUNTER — Ambulatory Visit

## 2024-05-26 NOTE — Progress Notes (Deleted)
 DVT Clinic Note  Name: Denise Santana     MRN: [id number]     DOB: 05/26/1981     Sex: female  PCP: Pcp, No  Today's Visit:    Referred to DVT Clinic by: Primary Care - Saddie Sacks, PA-C Referred to CPP by: Dr. Lanis Reason for referral: No chief complaint on file.  HISTORY OF PRESENT ILLNESS: Denise Santana is a 43 y.o. female with PMH recurrent DVT, B12 deficiency, anxiety, depression, and tobacco use who presents after diagnosis of DVT for medication management. First DVT involving the right posterior tibial and peroneal veins was diagnosed in 2022. Admitted to [facility] 03/28/24 to 03/30/24 for bilateral pulmonary emboli PE and DVT. Ultrasound on 03/29/24 showed acute on chronic DVT involving the right posterior tibial and peroneal veins and acute DVT involving the left popliteal, posterior tibial, and peroneal veins. Also noted to have acute intramuscular thrombus in the left gastrocnemius veins. She was discharged on Eliquis  and referred to hematology for outpatient hypercoagulable work up. She returned to the ED on 04/25/24 with chest pain and lightheadedness. CTA was improved, no new clot identified, no evidence of right heart strain and she was discharged. She was readmitted 05/08/24 to 05/10/24. CTA at that time showed worsening clot burden and evidence of right heart strain despite reported adherence to Eliquis . She was switched to heparin  and hematology recommended transitioning to Lovenox  on discharge due to concern for antiphospholipid syndrome. Recommended for her to follow up as outpatient with hematology to complete this work up. Patient left AMA prior to getting Lovenox  from the pharmacy. She returned to the ED on 05/12/24 requesting a work note and Lovenox  prescription which was then prescribed. She established care with a PCP on 05/18/24 and at that visit reported no anticoagulation since discharge due to the cost of Lovenox  and adverse effects from Eliquis . PCP started her on Xarelto  and referred her to  vascular surgery and hematology for follow up. Hematology has been unable to reach her to schedule appointment.           Rx Insurance Coverage: Medicaid Rx Affordability: Lovenox  is $4 per 30 day supply with her insurance Rx Assistance Provided: {Rx Assistance Provided:[id number]} Preferred Pharmacy: ***  Past Medical History:  Diagnosis Date   DVT (deep venous thrombosis) (HCC)     Past Surgical History:  Procedure Laterality Date   APPENDECTOMY     IR ANGIOGRAM PULMONARY BILATERAL SELECTIVE  05/09/2024   IR ANGIOGRAM SELECTIVE EACH ADDITIONAL VESSEL  05/09/2024   IR ANGIOGRAM SELECTIVE EACH ADDITIONAL VESSEL  05/09/2024   IR INFUSION THROMBOL ARTERIAL INITIAL (MS)  05/09/2024   IR INFUSION THROMBOL ARTERIAL INITIAL (MS)  05/09/2024   IR IVC FILTER PLMT / S&I /IMG GUID/MOD SED  05/09/2024   IR THROMB F/U EVAL ART/VEN FINAL DAY (MS)  05/10/2024   IR US  GUIDE VASC ACCESS RIGHT  05/09/2024   KIDNEY SURGERY      Social History   Socioeconomic History   Marital status: Single    Spouse name: Not on file   Number of children: 4   Years of education: Not on file   Highest education level: Not on file  Occupational History   Occupation: PCA/ [employer]  Tobacco Use   Smoking status: Every Day    Current packs/day: 1.00    Types: Cigarettes   Smokeless tobacco: Never  Vaping Use   Vaping status: Never Used  Substance and Sexual Activity   Alcohol use: Never  Drug use: Never   Sexual activity: Yes  Other Topics Concern   Not on file  Social History Narrative   Caffeine Intake: __0_   cups per day   Weight:    Are you satisfied with your weight? YES   Diet: How do you rate your diet?GOOD   Do you eat or drink 4 servings of daily or soy daily or take calcium supplements?NO   Exercise:    Do you exercise regularly?NO   What type of exercise? N/A   How long(minutes)? N/A   How often? N/A   Safety:    Do you wear a bike helmet? YES   Do you use your  seatbelts constantly?YES   Is violence at home a concern for you?NO   Have you ever been abused?NO   Do you have a gun in your home?NO   Social Drivers of Corporate investment banker Strain: Not on file  Food Insecurity: No Food Insecurity (05/09/2024)   Hunger Vital Sign    Worried About Running Out of Food in the Last Year: Never true    Ran Out of Food in the Last Year: Never true  Transportation Needs: No Transportation Needs (05/09/2024)   PRAPARE - Administrator, Civil Service (Medical): No    Lack of Transportation (Non-Medical): No  Physical Activity: Not on file  Stress: Not on file  Social Connections: Not on file  Intimate Partner Violence: Not At Risk (05/09/2024)   Humiliation, Afraid, Rape, and Kick questionnaire    Fear of Current or Ex-Partner: No    Emotionally Abused: No    Physically Abused: No    Sexually Abused: No    Family History  Problem Relation Age of Onset   Hypertension Mother    Hyperlipidemia Mother    Diabetes Mother    Heart failure Mother    Heart attack Maternal Grandmother     Allergies as of 05/27/2024   (No Known Allergies)    Current Outpatient Medications on File Prior to Visit  Medication Sig Dispense Refill   acetaminophen  (TYLENOL ) 325 MG tablet Take 2 tablets (650 mg total) by mouth every 6 (six) hours.     hydrOXYzine  (VISTARIL ) 50 MG capsule Take 1 capsule (50 mg total) by mouth at bedtime as needed. 30 capsule 0   Rivaroxaban  (XARELTO ) 15 MG TABS tablet Take 1 tablet (15 mg total) by mouth 2 (two) times daily with a meal. 42 tablet 0   [START ON 06/08/2024] rivaroxaban  (XARELTO ) 20 MG TABS tablet Take 1 tablet (20 mg total) by mouth daily with supper. 30 tablet 2   sertraline  (ZOLOFT ) 50 MG tablet Take 1 tablet (50 mg total) by mouth daily. 30 tablet 2   No current facility-administered medications on file prior to visit.   REVIEW OF SYSTEMS:  ROS PHYSICAL EXAMINATION:  There were no vitals filed for this visit.   There is no height or weight on file to calculate BMI.  Physical Exam Villalta Score for Post-Thrombotic Syndrome:    LABS:  CBC     Component Value Date/Time   WBC 6.3 05/18/2024 1556   WBC 6.9 05/12/2024 1259   RBC 3.44 (L) 05/18/2024 1556   RBC 3.29 (L) 05/12/2024 1259   HGB 11.5 05/18/2024 1556   HCT 35.2 05/18/2024 1556   PLT 225 05/18/2024 1556   MCV 102 (H) 05/18/2024 1556   MCH 33.4 (H) 05/18/2024 1556   MCH 34.0 05/12/2024 1259   MCHC 32.7  05/18/2024 1556   MCHC 33.8 05/12/2024 1259   RDW 14.2 05/18/2024 1556   LYMPHSABS 2.9 05/18/2024 1556   MONOABS 0.4 05/12/2024 1259   EOSABS 0.1 05/18/2024 1556   BASOSABS 0.0 05/18/2024 1556    Hepatic Function      Component Value Date/Time   PROT 6.3 05/18/2024 1556   ALBUMIN  4.2 05/18/2024 1556   AST 13 05/18/2024 1556   ALT 19 05/18/2024 1556   ALKPHOS 106 05/18/2024 1556   BILITOT 0.5 05/18/2024 1556    Renal Function   Lab Results  Component Value Date   CREATININE 1.15 (H) 05/18/2024   CREATININE 1.03 (H) 05/12/2024   CREATININE 1.19 (H) 05/10/2024    Estimated Creatinine Clearance: 85.6 mL/min (A) (by C-G formula based on SCr of 1.15 mg/dL (H)).   [facility] Vascular Lab Studies:  03/29/24 VAS US  LOWER EXTREMITY VENOUS BILAT (DVT): Summary:  RIGHT:  - Findings consistent with acute on chronic deep vein thrombosis involving  the right posterior tibial veins, and right peroneal veins.   - Findings consistent with chronic deep vein thrombosis involving the  right popliteal vein.    - No cystic structure found in the popliteal fossa.    LEFT:  - Findings consistent with acute deep vein thrombosis involving the left  popliteal vein, left posterior tibial veins, and left peroneal veins.  Findings consistent with acute intramuscular thrombosis involving the left  gastrocnemius veins.   - No cystic structure found in the popliteal fossa.   ASSESSMENT:    Patient with prior history of DVT in the right  posterior tibial and peroneal veins now diagnosed with acute on chronic DVT in the right posterior tibial and peroneal veins as well as acute DVT in the left popliteal, posterior tibial, and peroneal veins and bilateral pulmonary emboli. She was initially admitted to [facility] and discharged on Eliquis  with recommendation for outpatient hematology follow up to complete hypercoagulable work up. However, she was readmitted with CTA showing worsening clot burden and evidence of right heart strain despite adherence to Eliquis . Hematology recommended switching to Lovenox  due to concern for antiphospholipid syndrome. She left AMA and was without anticoagulation until establishing care with a new PCP last week who started her on Xarelto  as the patient reported Lovenox  was unaffordable. Appropriate to switch back to Lovenox  today per prior hematology recommendations. Will start Lovenox  120 mg (1 mg/kg) every 12 hours. Test claim today shows Lovenox  is $4 with her Medicaid insurance which she says is affordable *** and prescription was filled *** today at [facility]. Needs follow up with hematology for hypercoagulable work up and referral has already been placed. Counseled her on the importance of scheduling this appointment as it appears they have been unable to reach her for scheduling. Recommend further anticoagulation management per hematology. Extensively counseled on Lovenox  and all patient questions were answered.  PLAN: {DVT Clinic Eojw:71604}  Patient is discharged from the DVT Clinic:  {DVT CLINIC DISCHARGE PWQN:71609} Follow up: ***  Izetta Henry, PharmD Deep Vein Thrombosis Clinic Clinical Pharmacist

## 2024-05-27 ENCOUNTER — Ambulatory Visit: Attending: Pharmacist | Admitting: Pharmacist

## 2024-06-03 ENCOUNTER — Ambulatory Visit

## 2024-06-04 ENCOUNTER — Other Ambulatory Visit (HOSPITAL_COMMUNITY): Payer: Self-pay

## 2024-06-04 ENCOUNTER — Other Ambulatory Visit: Payer: Self-pay

## 2024-06-04 ENCOUNTER — Telehealth: Payer: Self-pay | Admitting: Pharmacist

## 2024-06-04 DIAGNOSIS — I2699 Other pulmonary embolism without acute cor pulmonale: Secondary | ICD-10-CM

## 2024-06-04 DIAGNOSIS — I82433 Acute embolism and thrombosis of popliteal vein, bilateral: Secondary | ICD-10-CM

## 2024-06-04 MED ORDER — ENOXAPARIN SODIUM 120 MG/0.8ML IJ SOSY
120.0000 mg | PREFILLED_SYRINGE | Freq: Two times a day (BID) | INTRAMUSCULAR | 0 refills | Status: DC
  Filled 2024-06-04: qty 40, 25d supply, fill #0
  Filled 2024-06-04: qty 8, 5d supply, fill #0

## 2024-06-04 NOTE — Telephone Encounter (Addendum)
 Patient called Denise Santana reporting Denise Santana has been without anticoagulation since hospital discharge due to cost. Denise Santana states that [facility] said her insurance does not cover Lovenox  and the cost would be $5000 per 1 month supply. Reports Denise Santana did not try to fill Xarelto . Hematology recommended treatment with Lovenox  given progression of PE while on Eliquis . Her Medicaid insurance appears active and [employer] test claim shows Lovenox  is $4 per 1 month supply. Prescription for Lovenox  120 mg (~1 mg/kg) every 12 hours sent to [facility] and the patient stated Denise Santana would pick this up today. Counseled on proper dosing and administration of Lovenox  and patient expressed understanding. Confirmed Denise Santana is aware of Denise Santana appointment next Tuesday, 06/08/24. Discussed that hematology has been trying to reach her to schedule an appointment. Provided their phone number and Denise Santana said Denise Santana would call to schedule the appointment. All patient questions were answered.

## 2024-06-07 NOTE — Progress Notes (Unsigned)
 DVT Clinic Note  Name: Denise Santana     MRN: [id number]     DOB: 11-Feb-1981     Sex: female  PCP: Pcp, No  Today's Visit: Visit Information: Initial Visit  Referred to DVT Clinic by: Primary Care - Saddie Sacks, PA-C Referred to CPP by: Dr. Lanis Reason for referral:  Chief Complaint  Patient presents with   Med Management - DVT   HISTORY OF PRESENT ILLNESS: Denise Santana is a 43 y.o. female with PMH recurrent DVT, B12 deficiency, anxiety, depression, and tobacco use who presents after diagnosis of DVT for medication management. First DVT involving the right posterior tibial and peroneal veins was diagnosed in 2022. Admitted to [facility] 03/28/24 to 03/30/24 for bilateral pulmonary emboli PE and DVT. Ultrasound on 03/29/24 showed acute on chronic DVT involving the right posterior tibial and peroneal veins and acute DVT involving the left popliteal, posterior tibial, and peroneal veins. Also noted to have acute intramuscular thrombus in the left gastrocnemius veins. She was discharged on Eliquis  and referred to hematology for outpatient hypercoagulable work up. She returned to the ED on 04/25/24 with chest pain and lightheadedness. CTA was improved, no new clot identified, no evidence of right heart strain and she was discharged. She was readmitted 05/08/24 to 05/10/24. CTA at that time showed worsening clot burden and evidence of right heart strain despite reported adherence to Eliquis . She was switched to heparin  and hematology recommended transitioning to Lovenox  on discharge due to concern for antiphospholipid syndrome. Recommended for her to follow up as outpatient with hematology to complete further work up. Patient left AMA prior to getting Lovenox  from the pharmacy. She returned to the ED on 05/12/24 requesting a work note and Lovenox  prescription which was then prescribed. She established care with a PCP on 05/18/24 and reported no anticoagulation since discharge due to the cost of Lovenox  and adverse effects  from Eliquis . PCP started her on Xarelto  and referred her to vascular surgery and hematology for follow up. Hematology has been unable to reach her to schedule appointment.   Today, patient is ambulating independently and accompanied by her husband. Reports that she is having severe pain throughout both legs and is very sensitive to touch. Describes path of pain as being on the outside of her thigh through her hip. Asks if there is anything we can give her to help the pain. Reports swelling up to her thigh. According to patient, she was not having swelling before DVT diagnosis last month. Denies abnormal bleeding or bruising. From 05/10/24 to 06/04/24, she was not on any anticoagulation. Reports issues with the pharmacy, saying her sertraline  and hydroxyzine  are $500. Denies missed doses of Lovenox  since picking it up. She was able to get this for $4 at our pharmacy. Takes it at 3am and 3pm to fit her work schedule. Works at a nursing home, is on her feet all day. Denies wearing compression stockings. Says she looked into this and it was going to be $300.   Positive Thrombotic Risk Factors: Previous VTE, Obesity Bleeding Risk Factors: Anticoagulant therapy  Negative Thrombotic Risk Factors: Recent surgery (within 3 months), Recent trauma (within 3 months), Recent admission to hospital with acute illness (within 3 months), Paralysis, paresis, or recent plaster cast immobilization of lower extremity, Central venous catheterization, Bed rest >72 hours within 3 months, Sedentary journey lasting >8 hours within 4 weeks, Pregnancy, Recent cesarean section (within 3 months), Within 6 weeks postpartum, Estrogen therapy, Testosterone therapy, Erythropoiesis-stimulating agent, Recent COVID diagnosis (within  3 months), Active cancer, Non-malignant, chronic inflammatory condition, Known thrombophilic condition, Smoking, Older age  Rx Insurance Coverage: Medicaid Rx Affordability: Lovenox  is $4 per 30 day supply with her  insurance Rx Assistance Provided: Counseled patient on cost of medication being only $4/month and assisting with getting Lovenox  filled at our pharmacy Preferred [facility]acy: [facility]  Past Medical History:  Diagnosis Date   DVT (deep venous thrombosis) [facility])     Past Surgical History:  Procedure Laterality Date   APPENDECTOMY     IR ANGIOGRAM PULMONARY BILATERAL SELECTIVE  05/09/2024   IR ANGIOGRAM SELECTIVE EACH ADDITIONAL VESSEL  05/09/2024   IR ANGIOGRAM SELECTIVE EACH ADDITIONAL VESSEL  05/09/2024   IR INFUSION THROMBOL ARTERIAL INITIAL (MS)  05/09/2024   IR INFUSION THROMBOL ARTERIAL INITIAL (MS)  05/09/2024   IR IVC FILTER PLMT / S&I /IMG GUID/MOD SED  05/09/2024   IR THROMB F/U EVAL ART/VEN FINAL DAY (MS)  05/10/2024   IR US  GUIDE VASC ACCESS RIGHT  05/09/2024   KIDNEY SURGERY      Social History   Socioeconomic History   Marital status: Single    Spouse name: Not on file   Number of children: 4   Years of education: Not on file   Highest education level: Not on file  Occupational History   Occupation: PCA/ [employer]  Tobacco Use   Smoking status: Every Day    Current packs/day: 1.00    Types: Cigarettes   Smokeless tobacco: Never  Vaping Use   Vaping status: Never Used  Substance and Sexual Activity   Alcohol use: Never   Drug use: Never   Sexual activity: Yes  Other Topics Concern   Not on file  Social History Narrative   Caffeine Intake: __0_   cups per day   Weight:    Are you satisfied with your weight? YES   Diet: How do you rate your diet?GOOD   Do you eat or drink 4 servings of daily or soy daily or take calcium supplements?NO   Exercise:    Do you exercise regularly?NO   What type of exercise? N/A   How long(minutes)? N/A   How often? N/A   Safety:    Do you wear a bike helmet? YES   Do you use your seatbelts constantly?YES   Is violence at home a concern for you?NO   Have you ever been abused?NO   Do you have a gun in your home?NO    Social Drivers of Corporate investment banker Strain: Not on file  Food Insecurity: No Food Insecurity (05/09/2024)   Hunger Vital Sign    Worried About Running Out of Food in the Last Year: Never true    Ran Out of Food in the Last Year: Never true  Transportation Needs: No Transportation Needs (05/09/2024)   PRAPARE - Administrator, Civil Service (Medical): No    Lack of Transportation (Non-Medical): No  Physical Activity: Not on file  Stress: Not on file  Social Connections: Not on file  Intimate Partner Violence: Not At Risk (05/09/2024)   Humiliation, Afraid, Rape, and Kick questionnaire    Fear of Current or Ex-Partner: No    Emotionally Abused: No    Physically Abused: No    Sexually Abused: No    Family History  Problem Relation Age of Onset   Hypertension Mother    Hyperlipidemia Mother    Diabetes Mother    Heart failure Mother  Heart attack Maternal Grandmother     Allergies as of 06/08/2024   (No Known Allergies)    Current Outpatient Medications on File Prior to Visit  Medication Sig Dispense Refill   acetaminophen  (TYLENOL ) 325 MG tablet Take 2 tablets (650 mg total) by mouth every 6 (six) hours.     enoxaparin  (LOVENOX ) 120 MG/0.8ML injection Inject 0.8 mLs (120 mg total) into the skin every 12 (twelve) hours. 48 mL 0   hydrOXYzine  (VISTARIL ) 50 MG capsule Take 1 capsule (50 mg total) by mouth at bedtime as needed. (Patient not taking: Reported on 06/08/2024) 30 capsule 0   sertraline  (ZOLOFT ) 50 MG tablet Take 1 tablet (50 mg total) by mouth daily. (Patient not taking: Reported on 06/08/2024) 30 tablet 2   No current facility-administered medications on file prior to visit.   REVIEW OF SYSTEMS:  Review of Systems  Respiratory:  Positive for shortness of breath.   Cardiovascular:  Positive for leg swelling. Negative for chest pain and palpitations.  Musculoskeletal:  Positive for myalgias.  Neurological:  Positive for tingling. Negative for  dizziness.   PHYSICAL EXAMINATION:  Vitals:   06/08/24 1341  BP: 117/74  Pulse: 84  SpO2: 96%  Weight: 274 lb 6.4 oz (124.5 kg)    Body mass index is 42.98 kg/m.  Physical Exam Vitals reviewed.  Cardiovascular:     Rate and Rhythm: Normal rate.  Pulmonary:     Effort: Pulmonary effort is normal.  Musculoskeletal:        General: Swelling (>3+ bilaterally) and tenderness present.  Skin:    Findings: No bruising or erythema.   LABS:  CBC     Component Value Date/Time   WBC 6.3 05/18/2024 1556   WBC 6.9 05/12/2024 1259   RBC 3.44 (L) 05/18/2024 1556   RBC 3.29 (L) 05/12/2024 1259   HGB 11.5 05/18/2024 1556   HCT 35.2 05/18/2024 1556   PLT 225 05/18/2024 1556   MCV 102 (H) 05/18/2024 1556   MCH 33.4 (H) 05/18/2024 1556   MCH 34.0 05/12/2024 1259   MCHC 32.7 05/18/2024 1556   MCHC 33.8 05/12/2024 1259   RDW 14.2 05/18/2024 1556   LYMPHSABS 2.9 05/18/2024 1556   MONOABS 0.4 05/12/2024 1259   EOSABS 0.1 05/18/2024 1556   BASOSABS 0.0 05/18/2024 1556    Hepatic Function      Component Value Date/Time   PROT 6.3 05/18/2024 1556   ALBUMIN  4.2 05/18/2024 1556   AST 13 05/18/2024 1556   ALT 19 05/18/2024 1556   ALKPHOS 106 05/18/2024 1556   BILITOT 0.5 05/18/2024 1556    Renal Function   Lab Results  Component Value Date   CREATININE 1.15 (H) 05/18/2024   CREATININE 1.03 (H) 05/12/2024   CREATININE 1.19 (H) 05/10/2024    Estimated Creatinine Clearance: 87.3 mL/min (A) (by C-G formula based on SCr of 1.15 mg/dL (H)).   [facility] Vascular Lab Studies:  03/29/24 VAS US  LOWER EXTREMITY VENOUS BILAT (DVT): Summary:  RIGHT:  - Findings consistent with acute on chronic deep vein thrombosis involving  the right posterior tibial veins, and right peroneal veins.   - Findings consistent with chronic deep vein thrombosis involving the  right popliteal vein.    - No cystic structure found in the popliteal fossa.   LEFT:  - Findings consistent with acute deep vein  thrombosis involving the left  popliteal vein, left posterior tibial veins, and left peroneal veins.  Findings consistent with acute intramuscular thrombosis involving the left  gastrocnemius veins.  - No cystic structure found in the popliteal fossa.   ASSESSMENT: Patient with prior history of DVT in the right posterior tibial and peroneal veins now diagnosed with acute on chronic DVT in the right posterior tibial and peroneal veins as well as acute DVT in the left popliteal, posterior tibial, and peroneal veins and bilateral pulmonary emboli. She was initially admitted to [facility] and discharged 03/30/24 on Eliquis  with recommendation for outpatient hematology follow up to complete hypercoagulable work up. However, she was readmitted with CTA showing worsening clot burden and evidence of right heart strain despite adherence to Eliquis . Hematology recommended switching to Lovenox  due to concern for antiphospholipid syndrome. She left 05/10/24 AMA and was without anticoagulation until establishing care with a new PCP 05/18/24 who started her on Xarelto  as the patient reported Lovenox  was unaffordable. She was referred to our clinic for help with access.   We spoke with the patient on the phone on 06/04/24 and discovered that with her Medicaid insurance, Lovenox  is only $4/month or 3 month supply. She is seen today and has picked up Lovenox  and started taking it on 06/04/24. Her pain and swelling have not yet started to improve. She is ambulating well independently. She has been nonadherent with compression and elevation. Description of primary locations of pain in legs do not match the location of pain expected from DVT. Unable to assess via patient history how pain and swelling has changed since her last hospital discharge. Discussed with Dr. Magda, will ensure she has access to Lovenox  and compression stockings and defer further management to hematology. Otherwise, no other role for vascular surgery intervention at  this time. Counseled her on the importance of scheduling appointment with hematology as it appears they have been unable to reach her for scheduling. Recommend further anticoagulation management per hematology. There seems to be a gap between the information the patient has been receiving about costs of medications and compression stockings. Counseled her that with her insurance, all medications are no more than $4 for 3 months supply. We fitted her for compression stockings today which she purchased for $15.   PLAN: -Continue enoxaparin  (Lovenox ) 1 mg/kg (120 mg) every 12 hours. -Expected duration of therapy: lifelong, per hematology. Therapy started on 06/04/24. -Patient educated on purpose, proper use and potential adverse effects of enoxaparin  (Lovenox ). -Discussed importance of taking medication around the same time every day. -Advised patient of medications to avoid (NSAIDs, aspirin doses >100 mg daily). -Educated that Tylenol  (acetaminophen ) is the preferred analgesic to lower the risk of bleeding. -Advised patient to alert all providers of anticoagulation therapy prior to starting a new medication or having a procedure. -Emphasized importance of monitoring for signs and symptoms of bleeding (abnormal bruising, prolonged bleeding, nose bleeds, bleeding from gums, discolored urine, black tarry stools). -Educated patient to present to the ED if emergent signs and symptoms of new thrombosis occur. -Measured patient for compression stockings. -Counseled patient to wear compression stockings daily, removing at night.  Follow up: with hematology. Provided patient with phone number of the [facility] to make an appointment with them. She is aware they have tried to contact her and cannot reach her. No need for further DVT Clinic follow up.   Lum Herald, PharmD, [place], CPP Deep Vein Thrombosis Clinic Clinical Pharmacist Practitioner [phone]

## 2024-06-08 ENCOUNTER — Encounter: Payer: Self-pay | Admitting: Student-PharmD

## 2024-06-08 ENCOUNTER — Other Ambulatory Visit (HOSPITAL_COMMUNITY): Payer: Self-pay

## 2024-06-08 ENCOUNTER — Ambulatory Visit: Attending: Vascular Surgery | Admitting: Student-PharmD

## 2024-06-08 VITALS — BP 117/74 | HR 84 | Wt 274.4 lb

## 2024-06-08 DIAGNOSIS — I82433 Acute embolism and thrombosis of popliteal vein, bilateral: Secondary | ICD-10-CM | POA: Diagnosis present

## 2024-06-08 DIAGNOSIS — I2699 Other pulmonary embolism without acute cor pulmonale: Secondary | ICD-10-CM | POA: Diagnosis not present

## 2024-06-09 ENCOUNTER — Other Ambulatory Visit (HOSPITAL_COMMUNITY): Payer: Self-pay

## 2024-06-09 MED ORDER — SERTRALINE HCL 50 MG PO TABS: 50.0000 mg | ORAL_TABLET | Freq: Every day | ORAL | 2 refills | Status: AC

## 2024-06-09 MED ORDER — RIVAROXABAN 15 MG PO TABS: 15.0000 mg | ORAL_TABLET | Freq: Two times a day (BID) | ORAL | 0 refills | Status: AC

## 2024-06-09 MED ORDER — RIVAROXABAN 20 MG PO TABS
20.0000 mg | ORAL_TABLET | Freq: Every day | ORAL | 2 refills | Status: AC
  Filled 2024-10-05: qty 30, 30d supply, fill #0

## 2024-06-09 MED ORDER — HYDROXYZINE PAMOATE 50 MG PO CAPS: 50.0000 mg | ORAL_CAPSULE | Freq: Every evening | ORAL | 0 refills | Status: AC | PRN

## 2024-08-17 ENCOUNTER — Ambulatory Visit

## 2024-08-23 ENCOUNTER — Other Ambulatory Visit: Payer: Self-pay | Admitting: Interventional Radiology

## 2024-08-23 DIAGNOSIS — I2699 Other pulmonary embolism without acute cor pulmonale: Secondary | ICD-10-CM

## 2024-08-23 DIAGNOSIS — Z95828 Presence of other vascular implants and grafts: Secondary | ICD-10-CM

## 2024-08-23 DIAGNOSIS — Z86718 Personal history of other venous thrombosis and embolism: Secondary | ICD-10-CM

## 2024-09-04 ENCOUNTER — Encounter (HOSPITAL_COMMUNITY): Payer: Self-pay | Admitting: Emergency Medicine

## 2024-09-04 ENCOUNTER — Other Ambulatory Visit: Payer: Self-pay

## 2024-09-04 ENCOUNTER — Emergency Department (HOSPITAL_COMMUNITY)
Admission: EM | Admit: 2024-09-04 | Discharge: 2024-09-05 | Disposition: A | Attending: Emergency Medicine | Admitting: Emergency Medicine

## 2024-09-04 DIAGNOSIS — J101 Influenza due to other identified influenza virus with other respiratory manifestations: Secondary | ICD-10-CM | POA: Insufficient documentation

## 2024-09-04 DIAGNOSIS — R509 Fever, unspecified: Secondary | ICD-10-CM | POA: Diagnosis present

## 2024-09-04 LAB — RESP PANEL BY RT-PCR (RSV, FLU A&B, COVID)  RVPGX2
Influenza A by PCR: POSITIVE — AB
Influenza B by PCR: NEGATIVE
Resp Syncytial Virus by PCR: NEGATIVE
SARS Coronavirus 2 by RT PCR: NEGATIVE

## 2024-09-04 LAB — GROUP A STREP BY PCR: Group A Strep by PCR: NOT DETECTED

## 2024-09-04 MED ORDER — ACETAMINOPHEN 325 MG PO TABS
ORAL_TABLET | ORAL | Status: AC
  Filled 2024-09-04: qty 2

## 2024-09-04 MED ORDER — ACETAMINOPHEN 325 MG PO TABS
650.0000 mg | ORAL_TABLET | Freq: Once | ORAL | Status: AC | PRN
  Administered 2024-09-04: 650 mg via ORAL

## 2024-09-04 NOTE — ED Notes (Signed)
 When you have time, Emilio Roys (husband) [phone] would like an update on pt. Thank you

## 2024-09-04 NOTE — ED Triage Notes (Addendum)
 Pt via POV c/o SOB since earlier today. Pt drove herself here and parked at 4pm; family did not know where she was and drove here to find her. Pt checked in at about 2115pm. PMH includes blood clots. Pt is febrile in triage and moaning. 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 She later adds that she has had a sore throat and generalized body aches since Thursday, x 3 days.

## 2024-09-04 NOTE — ED Triage Notes (Signed)
 Complaining of numbness in the arms and legs that started Thursday. Today she is having some shortness of breath and headaches.

## 2024-09-05 MED ORDER — ACETAMINOPHEN 325 MG PO TABS
650.0000 mg | ORAL_TABLET | Freq: Once | ORAL | Status: AC
  Administered 2024-09-05: 650 mg via ORAL
  Filled 2024-09-05: qty 2

## 2024-09-05 NOTE — ED Provider Notes (Signed)
 " [place] EMERGENCY DEPARTMENT AT [facility] Provider Note   CSN: [id number] Arrival date & time: 09/04/24  2123     Patient presents with: Cough and Fever   Denise Santana is a 43 y.o. female.  Patient with past medical history significant for DVT and PE, currently on Lovenox  presents to the emergency department complaining of cough, sore throat, body aches, subjective fever since Thursday.  She also endorses a headache.  During triage she endorsed some numbness in her arms and legs but she is not complaining of that at this time.  She denies chest pain, shortness of breath, abdominal pain, nausea, vomiting. Patient works in health care.  She reports compliance with her Lovenox  injections   HPI     Prior to Admission medications  Medication Sig Start Date End Date Taking? Authorizing Provider  acetaminophen  (TYLENOL ) 325 MG tablet Take 2 tablets (650 mg total) by mouth every 6 (six) hours. 03/30/24   Gomes, Adriana, DO  enoxaparin  (LOVENOX ) 120 MG/0.8ML injection Inject 0.8 mLs (120 mg total) into the skin every 12 (twelve) hours. 06/04/24   Robins, Joshua E, MD  hydrOXYzine  (VISTARIL ) 50 MG capsule Take 1 capsule (50 mg total) by mouth at bedtime as needed. 06/09/24   Gayle Saddie FALCON, PA-C  rivaroxaban  (XARELTO ) 20 MG TABS tablet Take 1 tablet (20 mg total) by mouth daily. 05/18/24   Gayle Saddie FALCON, PA-C  sertraline  (ZOLOFT ) 50 MG tablet Take 1 tablet (50 mg total) by mouth daily. 06/09/24   Gayle Saddie FALCON, PA-C    Allergies: Patient has no known allergies.    Review of Systems  Updated Vital Signs BP 128/74 (BP Location: Right Arm)   Pulse 72   Temp (!) 100.9 F (38.3 C) (Oral)   Resp 18   Ht 5' 6 (1.676 m)   Wt 117.9 kg   LMP 08/29/2024 (Exact Date)   SpO2 96%   BMI 41.97 kg/m   Physical Exam Vitals and nursing note reviewed.  Constitutional:      General: She is not in acute distress.    Appearance: She is well-developed.  HENT:     Head: Normocephalic and  atraumatic.  Eyes:     Conjunctiva/sclera: Conjunctivae normal.  Cardiovascular:     Rate and Rhythm: Normal rate and regular rhythm.  Pulmonary:     Effort: Pulmonary effort is normal. No respiratory distress.     Breath sounds: Normal breath sounds.  Abdominal:     Palpations: Abdomen is soft.     Tenderness: There is no abdominal tenderness.  Musculoskeletal:        General: No swelling.     Cervical back: Neck supple.  Skin:    General: Skin is warm and dry.     Capillary Refill: Capillary refill takes less than 2 seconds.  Neurological:     Mental Status: She is alert.  Psychiatric:        Mood and Affect: Mood normal.     (all labs ordered are listed, but only abnormal results are displayed) Labs Reviewed  RESP PANEL BY RT-PCR (RSV, FLU A&B, COVID)  RVPGX2 - Abnormal; Notable for the following components:      Result Value   Influenza A by PCR POSITIVE (*)    All other components within normal limits  GROUP A STREP BY PCR    EKG: EKG Interpretation Date/Time:  Saturday September 04 2024 21:51:30 EST Ventricular Rate:  90 PR Interval:  128 QRS Duration:  76 QT Interval:  364 QTC Calculation: 445 R Axis:   67  Text Interpretation: Normal sinus rhythm Nonspecific ST abnormality Abnormal ECG When compared with ECG of 12-May-2024 13:08, No significant change was found Confirmed by Raford Lenis ([id number]) on 09/04/2024 11:18:54 PM  Radiology: No results found.   Procedures   Medications Ordered in the ED  acetaminophen  (TYLENOL ) tablet 650 mg (650 mg Oral Given 09/04/24 2212)  acetaminophen  (TYLENOL ) tablet 650 mg (650 mg Oral Given 09/05/24 0537)                                    Medical Decision Making Risk OTC drugs.   This patient presents to the ED for concern of cough body aches, this involves an extensive number of treatment options, and is a complaint that carries with it a high risk of complications and morbidity.  The differential diagnosis  includes COVID, flu, RSV, other viral illness, pneumonia, sepsis, others   Co morbidities / Chronic conditions that complicate the patient evaluation  History of blood clots   Additional history obtained:  Additional history obtained from EMR External records from outside source obtained and reviewed including vascular surgery note   Lab Tests:  I Ordered, and personally interpreted labs.  The pertinent results include: Positive flu a   Problem List / ED Course / Critical interventions / Medication management   I ordered medication including Tylenol  Reevaluation of the patient after these medicines showed that the patient improved   Social Determinants of Health:  Patient is a daily smoker   Test / Admission - Considered:  Patient with symptoms consistent with a viral respiratory infection.  Testing positive for influenza A.  Patient's symptoms began 3 days ago she is outside the window for Tamiflu.  I have recommended supportive care at home including Tylenol  for fever control.  No indication for further emergent workup or admission.  Patient stable for discharge home.      Final diagnoses:  Influenza A    ED Discharge Orders     None          [name] [name] 09/05/24 [id number]    Trine Raynell Moder, MD 09/05/24 [phone]  "

## 2024-09-05 NOTE — Discharge Instructions (Signed)
 Your testing this morning shows that you have a flu infection.  This is a viral infection that your body will need to monitor response to.  Antibiotics are not effective against the flu.  You may take Tylenol  for fever and pain control.  Follow-up with your primary care provider as needed.  ED develop any life-threatening symptoms return immediately to the emergency department.

## 2024-09-06 ENCOUNTER — Inpatient Hospital Stay
Admission: RE | Admit: 2024-09-06 | Discharge: 2024-09-06 | Attending: Interventional Radiology | Admitting: Interventional Radiology

## 2024-09-06 DIAGNOSIS — I2699 Other pulmonary embolism without acute cor pulmonale: Secondary | ICD-10-CM

## 2024-09-06 DIAGNOSIS — Z86718 Personal history of other venous thrombosis and embolism: Secondary | ICD-10-CM

## 2024-09-06 DIAGNOSIS — Z95828 Presence of other vascular implants and grafts: Secondary | ICD-10-CM

## 2024-09-07 NOTE — Telephone Encounter (Signed)
 Results from the ultrasound are not back yet. Another provider ordered this but I will look at the results when they are back.  If she is having shortness of breath and leg swelling, I recommend going to the ER immediately.

## 2024-09-13 ENCOUNTER — Emergency Department (HOSPITAL_COMMUNITY)
Admission: EM | Admit: 2024-09-13 | Discharge: 2024-09-13 | Disposition: A | Discharge status: Home / Self Care | Attending: Emergency Medicine | Admitting: Emergency Medicine

## 2024-09-13 ENCOUNTER — Emergency Department (HOSPITAL_COMMUNITY)

## 2024-09-13 ENCOUNTER — Emergency Department (EMERGENCY_DEPARTMENT_HOSPITAL)

## 2024-09-13 ENCOUNTER — Other Ambulatory Visit: Payer: Self-pay

## 2024-09-13 ENCOUNTER — Encounter (HOSPITAL_COMMUNITY): Payer: Self-pay | Admitting: *Deleted

## 2024-09-13 DIAGNOSIS — M545 Low back pain, unspecified: Secondary | ICD-10-CM | POA: Insufficient documentation

## 2024-09-13 DIAGNOSIS — M79605 Pain in left leg: Secondary | ICD-10-CM | POA: Diagnosis present

## 2024-09-13 DIAGNOSIS — M79662 Pain in left lower leg: Secondary | ICD-10-CM

## 2024-09-13 DIAGNOSIS — I82412 Acute embolism and thrombosis of left femoral vein: Secondary | ICD-10-CM | POA: Insufficient documentation

## 2024-09-13 LAB — COMPREHENSIVE METABOLIC PANEL WITH GFR
ALT: 27 U/L (ref 0–44)
AST: 20 U/L (ref 15–41)
Albumin: 4.7 g/dL (ref 3.5–5.0)
Alkaline Phosphatase: 104 U/L (ref 38–126)
Anion gap: 13 (ref 5–15)
BUN: 17 mg/dL (ref 6–20)
CO2: 23 mmol/L (ref 22–32)
Calcium: 9.4 mg/dL (ref 8.9–10.3)
Chloride: 102 mmol/L (ref 98–111)
Creatinine, Ser: 1.25 mg/dL — ABNORMAL HIGH (ref 0.44–1.00)
GFR, Estimated: 55 mL/min — ABNORMAL LOW
Glucose, Bld: 118 mg/dL — ABNORMAL HIGH (ref 70–99)
Potassium: 3.4 mmol/L — ABNORMAL LOW (ref 3.5–5.1)
Sodium: 138 mmol/L (ref 135–145)
Total Bilirubin: 0.6 mg/dL (ref 0.0–1.2)
Total Protein: 7.5 g/dL (ref 6.5–8.1)

## 2024-09-13 LAB — CBC WITH DIFFERENTIAL/PLATELET
Abs Immature Granulocytes: 0.04 K/uL (ref 0.00–0.07)
Basophils Absolute: 0 K/uL (ref 0.0–0.1)
Basophils Relative: 0 %
Eosinophils Absolute: 0.1 K/uL (ref 0.0–0.5)
Eosinophils Relative: 1 %
HCT: 33.5 % — ABNORMAL LOW (ref 36.0–46.0)
Hemoglobin: 11.2 g/dL — ABNORMAL LOW (ref 12.0–15.0)
Immature Granulocytes: 1 %
Lymphocytes Relative: 33 %
Lymphs Abs: 2.8 K/uL (ref 0.7–4.0)
MCH: 34.4 pg — ABNORMAL HIGH (ref 26.0–34.0)
MCHC: 33.4 g/dL (ref 30.0–36.0)
MCV: 102.8 fL — ABNORMAL HIGH (ref 80.0–100.0)
Monocytes Absolute: 0.5 K/uL (ref 0.1–1.0)
Monocytes Relative: 6 %
Neutro Abs: 5.2 K/uL (ref 1.7–7.7)
Neutrophils Relative %: 59 %
Platelets: 161 K/uL (ref 150–400)
RBC: 3.26 MIL/uL — ABNORMAL LOW (ref 3.87–5.11)
RDW: 14.6 % (ref 11.5–15.5)
WBC: 8.6 K/uL (ref 4.0–10.5)
nRBC: 0 % (ref 0.0–0.2)

## 2024-09-13 LAB — HEPARIN LEVEL (UNFRACTIONATED)
Heparin Unfractionated: <0.1 [IU]/mL — ABNORMAL LOW (ref 0.30–0.70)
Heparin Unfractionated: 0.51 [IU]/mL (ref 0.30–0.70)

## 2024-09-13 LAB — TROPONIN T, HIGH SENSITIVITY: Troponin T High Sensitivity: <15 ng/L (ref 0–19)

## 2024-09-13 LAB — PRO BRAIN NATRIURETIC PEPTIDE: Pro Brain Natriuretic Peptide: <50 pg/mL

## 2024-09-13 MED ORDER — HEPARIN BOLUS VIA INFUSION
5000.0000 [IU] | Freq: Once | INTRAVENOUS | Status: AC
  Administered 2024-09-13: 5000 [IU] via INTRAVENOUS
  Filled 2024-09-13: qty 5000

## 2024-09-13 MED ORDER — OXYCODONE HCL 5 MG PO TABS
5.0000 mg | ORAL_TABLET | Freq: Three times a day (TID) | ORAL | 0 refills | Status: AC
  Filled 2024-09-13: qty 6, 2d supply, fill #0

## 2024-09-13 MED ORDER — OXYCODONE HCL 5 MG PO TABS
5.0000 mg | ORAL_TABLET | Freq: Once | ORAL | Status: AC
  Administered 2024-09-13: 5 mg via ORAL
  Filled 2024-09-13: qty 1

## 2024-09-13 MED ORDER — IOHEXOL 350 MG/ML SOLN
75.0000 mL | Freq: Once | INTRAVENOUS | Status: AC | PRN
  Administered 2024-09-13: 75 mL via INTRAVENOUS

## 2024-09-13 MED ORDER — HEPARIN (PORCINE) 25000 UT/250ML-% IV SOLN
1400.0000 [IU]/h | INTRAVENOUS | Status: DC
  Administered 2024-09-13: 1400 [IU]/h via INTRAVENOUS
  Filled 2024-09-13: qty 250

## 2024-09-13 MED ORDER — ENOXAPARIN SODIUM 120 MG/0.8ML IJ SOSY
120.0000 mg | PREFILLED_SYRINGE | Freq: Two times a day (BID) | INTRAMUSCULAR | 2 refills | Status: DC
  Filled 2024-09-13: qty 38.4, 24d supply, fill #0

## 2024-09-13 NOTE — ED Provider Notes (Signed)
 " [facility] Provider Note   HPI/ROS    History obtained from patient.  Denise Santana is a 43 y.o. female who presents for Back Pain and who  has a past medical history of DVT (deep venous thrombosis) (HCC).  Patient today presents with back pain with an onset of Sunday.  Denies any falls or trauma.  States that she has not been taking her blood thinner because she had the flu last week and did not feel up to taking it.  Endorses bilateral leg pain now that started over the last several days as well.  Not interested in answering other questions.  Limited history obtained due to cooperation with exam.  MDM   I have reviewed the nursing documentation, vital signs, as well as the past medical history, surgical history, family history, and social history.  Initial Assessment:  Patient hemodynamically stable on initial evaluation.  Minimally interactive on exam with me, but alert.  Does not interested in answering questions at this time.  Lumbar CT obtained in first look with no obvious compression fractures.  Pain is predominantly paraspinal in both the left and right lumbar paraspinal regions.  More likely to be MSK pain/muscle spasm.  No true red flag symptoms concerning for cauda equina or spinal epidural abscess.  Vascular ultrasound here with DVTs throughout the whole left leg including the left common femoral vein, femoral vein, profunda, as well as others.  No obvious DVTs on the right.  Given patient has not been on anticoagulation, will obtain CT PE although she tells me she has an IVC filter.  Will also place patient on heparin .  Patient given an additional dose of oxycodone  for pain.  Will add on basic labs and EKG as well as troponin.  Per pharmacy review, patient got roughly a 20-day supply of Lovenox  in September, but states she has been taking it intermittently.  CT PE here with no acute pulmonary embolism.  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  Troponin negative, no signs of RV strain on CT.  BNP unremarkable as well.  CMP with mild hypokalemia, without other significant electrolyte abnormalities.  Stable chronic macrocytic anemia without significant leukocytosis or thrombocytopenia.  Per chart review patient has had concern for antiphospholipid antibody syndrome and has not followed up with heme-onc.  Had prolonged discussion with patient and boyfriend at the bedside that it is imperative that she continues to go to these appointments and use her Lovenox  at home or this likely could be what kills her.  Boyfriend demonstrated understanding of this, and patient did as well, but I am still concerned she may not be adherent to medications on discharge.  Discussed with her that using her Lovenox  shots is the most important thing to help prevent her from getting recurrent pulmonary emboli.  She states that when discharge today she plans to use them every day at home, and her boyfriend says that he will make her.  They were given follow-up with the DVT clinic as well as a referral to hematology again.  They said that they will call tomorrow morning to make an appointment for follow-up.  Given no signs of hemodynamic instability, acute PE, right heart strain, or significant hypoxia, tachypnea, or tachycardia feel patient safe for discharge.  Patient plans to follow-up with her PCP and hematology/oncology in the outpatient setting.  Patient discharged home in hemodynamically stable condition with strict return precautions.  She was also  given a prescription for 2 days of oxycodone  for lumbar spine pain.  No difficulty ambulating on discharge.  Disposition:  I discussed the plan for discharge with the patient and/or their surrogate at bedside prior to discharge and they were in agreement with the plan and verbalized understanding of the return precautions provided. All questions answered to the best of my ability.  Ultimately, the patient was discharged in stable condition with stable vital signs. I am reassured that they are capable of close follow up and good social support at home.    This patient was staffed with Dr. Dean who supervised the visit and agreed with the plan of care.   Due to the patients current presenting symptoms, physical exam findings, and the workup stated above, it is thought that the etiology of the patients current presentation is:  1. Acute bilateral low back pain, unspecified whether sciatica present   2. Acute deep vein thrombosis (DVT) of femoral vein of left lower extremity (HCC)    Clinical Complexity A medically appropriate history, review of systems, and physical exam was performed.  Factors that affect the complexity of this encounter: assessment of correct protocol, laboratory work from this visit, notes from other physicians (Family Medicine), and review of echocardiogram/EKG results  My independent interpretations of diagnostic studies are documented in the ED course above.   If decision rules were used in this patient's evaluation, they are listed below.   Click here for ABCD2, HEART and other calculators  Patient's presentation is most consistent with acute presentation with potential threat to life or bodily function.  MDM generated using voice dictation software and may contain dictation errors. Please contact me for any clarification or with any questions.    Physical Exam, PMH, PSH, Family History, and Social Hsitory   Vitals:   09/13/24 1025 09/13/24 1033 09/13/24 1042 09/13/24 1053  BP: (!) 116/91     Pulse:  70    Resp: 17     Temp:  98.3 F (36.8 C)    TempSrc:  Oral    Weight:   99.8 kg 117.9 kg  Height:   5' 8 (1.727 m) 5' 6 (1.676 m)    Physical Exam Constitutional:      Appearance: She is obese.  HENT:     Head: Normocephalic and atraumatic.  Cardiovascular:     Rate and Rhythm: Normal rate and regular rhythm.  Pulmonary:      Effort: Pulmonary effort is normal.     Breath sounds: No wheezing or rales.  Abdominal:     General: There is no distension.     Tenderness: There is no abdominal tenderness. There is no guarding or rebound.  Musculoskeletal:     Right lower leg: Tenderness present. 3+ Edema present.     Left lower leg: Tenderness present. 3+ Edema present.     Comments: Tenderness in the left and right lumbar paraspinal muscle regions.  Also has tenderness to palpation over the midline.  Neurological:     General: No focal deficit present.     Mental Status: She is alert and oriented to person, place, and time.     Past Medical History:  Diagnosis Date   DVT (deep venous thrombosis) (HCC)      Past Surgical History:  Procedure Laterality Date   APPENDECTOMY     IR ANGIOGRAM PULMONARY BILATERAL SELECTIVE  05/09/2024   IR ANGIOGRAM SELECTIVE EACH ADDITIONAL VESSEL  05/09/2024   IR ANGIOGRAM SELECTIVE EACH ADDITIONAL VESSEL  05/09/2024   IR INFUSION THROMBOL ARTERIAL INITIAL (MS)  05/09/2024   IR INFUSION THROMBOL ARTERIAL INITIAL (MS)  05/09/2024   IR IVC FILTER PLMT / S&I /IMG GUID/MOD SED  05/09/2024   IR THROMB F/U EVAL ART/VEN FINAL DAY (MS)  05/10/2024   IR US  GUIDE VASC ACCESS RIGHT  05/09/2024   KIDNEY SURGERY       Family History  Problem Relation Age of Onset   Hypertension Mother    Hyperlipidemia Mother    Diabetes Mother    Heart failure Mother    Heart attack Maternal Grandmother     Social History   Tobacco Use   Smoking status: Every Day    Current packs/day: 1.00    Types: Cigarettes   Smokeless tobacco: Never  Substance Use Topics   Alcohol use: Never     Procedures   If procedures were preformed on this patient, they are listed below:  .Ultrasound ED Peripheral IV (Provider)  Date/Time: 09/13/2024 6:21 PM  Performed by: Guillermina Hamilton, MD Authorized by: Dean Clarity, MD   Procedure details:    Indications: multiple failed IV attempts     Skin Prep:  chlorhexidine  gluconate     Location:  Right forearm   Angiocath:  20 G   Bedside Ultrasound Guided: Yes     Images: not archived     Patient tolerated procedure without complications: Yes     Dressing applied: Yes    Electronically signed by:   Hamilton Carlin Guillermina, M.D. PGY-2, Emergency Medicine   Please note that this documentation was produced with the assistance of voice-to-text technology and may contain errors.    Guillermina Hamilton, MD 09/13/24 2229  "

## 2024-09-13 NOTE — Progress Notes (Signed)
 Lower extremity venous duplex completed. Please see CV Procedures for preliminary results.  Initial findings reported to Warren Shad, PA.  Garnette Rockers, RVT 09/13/2024 1:43 PM

## 2024-09-13 NOTE — Progress Notes (Addendum)
 PHARMACY - ANTICOAGULATION CONSULT NOTE  Pharmacy Consult for heparin  Indication: DVT  Allergies[1]  Patient Measurements: Height: 5' 6 (167.6 cm) Weight: 117.9 kg (260 lb) IBW/kg (Calculated) : 59.3 HEPARIN  DW (KG): 87.3  Vital Signs: Temp: 98.3 F (36.8 C) (12/29 1033) Temp Source: Oral (12/29 1033) BP: 116/91 (12/29 1025) Pulse Rate: 70 (12/29 1033)  Labs: No results for input(s): HGB, HCT, PLT, APTT, LABPROT, INR, HEPARINUNFRC, HEPRLOWMOCWT, CREATININE, CKTOTAL, CKMB, TROPONINIHS in the last 72 hours.  CrCl cannot be calculated (Patient's most recent lab result is older than the maximum 21 days allowed.).   Medical History: Past Medical History:  Diagnosis Date   DVT (deep venous thrombosis) (HCC)      Assessment: 66 YOF presenting with back pain, hx of DVT was on lovenox  however stopped injections several days ago, US  positive for DVT, PE study pending  Goal of Therapy:  Heparin  level 0.3-0.7 units/ml Monitor platelets by anticoagulation protocol: Yes   Plan:  Heparin  5000 units IV x 1, and gtt at 1400 units/hr F/u 6 hour heparin  level F/u PE study F/u long term [facility] plan  Dorn Poot, PharmD, [facility] Clinical Pharmacist ED Pharmacist Phone # [phone] 09/13/2024 3:37 PM       [1] No Known Allergies

## 2024-09-13 NOTE — ED Triage Notes (Signed)
 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

## 2024-09-13 NOTE — ED Triage Notes (Signed)
 C/o left testicle pain and swelling onset last week, no injury, c/o burning with urination

## 2024-09-13 NOTE — ED Provider Triage Note (Signed)
 Emergency Medicine Provider Triage Evaluation Note  Gemma Ruan , a 43 y.o. female  was evaluated in triage.  Pt complains of lower leg pain and swelling, hx of dvt, has not been taking RX. Also complains of bilateral low back pain, no red flag.   Review of Systems  Positive: Back pain, Left leg pain Negative: Fever  Physical Exam  BP (!) 116/91   Pulse 70   Temp 98.3 F (36.8 C) (Oral)   Resp 17   Ht 5' 6 (1.676 m)   Wt 117.9 kg   LMP 08/29/2024 (Exact Date)   BMI 41.97 kg/m  Gen:   Awake, no distress   Resp:  Normal effort  MSK:   Moves extremities without difficulty  Other:    Medical Decision Making  Medically screening exam initiated at 11:08 AM.  Appropriate orders placed.  Yevonne Yokum was informed that the remainder of the evaluation will be completed by another provider, this initial triage assessment does not replace that evaluation, and the importance of remaining in the ED until their evaluation is complete.     Shermon Warren SAILOR, [place] 09/13/24 8888

## 2024-09-13 NOTE — ED Notes (Signed)
 Denise Santana, the patient's husband is requesting an update. [phone]

## 2024-09-13 NOTE — Discharge Instructions (Addendum)
 You were seen today for leg pain and back pain. While you were here we monitored your vitals, preformed a physical exam, and labs and imaging. These were all reassuring and there is no indication for any further testing or intervention in the emergency department at this time.  You have a significant deep vein thrombosis in your left leg without signs of pulmonary embolism.  Things to do:  - Follow up with your primary care provider within the next 1-2 weeks - We sent a referral to the DVT clinic, you very much so need to go to this appointment to have them continue to follow your recurrent deep vein thrombosis - We will send your prescription for heparin , you need to use this every day at home - We also want you to follow-up with your hematologist Dr. Tina, call [phone] to schedule an appointment - There is absolutely no reason you should not be using your anticoagulation, please follow the hematologist instructions and use your anticoagulation every day  Return to the emergency department if you have any new or worsening symptoms including worsening back pain, leg pain, shortness of breath, difficulty obtaining your anticoagulation, or if you have any other concerns.

## 2024-09-14 ENCOUNTER — Other Ambulatory Visit (HOSPITAL_COMMUNITY): Payer: Self-pay

## 2024-09-14 ENCOUNTER — Other Ambulatory Visit: Payer: Self-pay

## 2024-09-16 ENCOUNTER — Emergency Department (HOSPITAL_COMMUNITY): Admission: EM | Admit: 2024-09-16 | Discharge: 2024-09-16 | Discharge status: Against Medical Advice

## 2024-09-16 ENCOUNTER — Emergency Department (HOSPITAL_COMMUNITY)

## 2024-09-16 ENCOUNTER — Encounter (HOSPITAL_COMMUNITY): Payer: Self-pay

## 2024-09-16 ENCOUNTER — Other Ambulatory Visit: Payer: Self-pay

## 2024-09-16 DIAGNOSIS — R10A2 Flank pain, left side: Secondary | ICD-10-CM | POA: Insufficient documentation

## 2024-09-16 DIAGNOSIS — R0602 Shortness of breath: Secondary | ICD-10-CM | POA: Diagnosis not present

## 2024-09-16 DIAGNOSIS — Z5321 Procedure and treatment not carried out due to patient leaving prior to being seen by health care provider: Secondary | ICD-10-CM | POA: Diagnosis not present

## 2024-09-16 DIAGNOSIS — M79605 Pain in left leg: Secondary | ICD-10-CM | POA: Diagnosis present

## 2024-09-16 DIAGNOSIS — R509 Fever, unspecified: Secondary | ICD-10-CM | POA: Diagnosis not present

## 2024-09-16 DIAGNOSIS — R079 Chest pain, unspecified: Secondary | ICD-10-CM | POA: Insufficient documentation

## 2024-09-16 LAB — URINALYSIS, ROUTINE W REFLEX MICROSCOPIC
Bilirubin Urine: NEGATIVE
Glucose, UA: NEGATIVE mg/dL
Hgb urine dipstick: NEGATIVE
Ketones, ur: NEGATIVE mg/dL
Nitrite: NEGATIVE
Protein, ur: 100 mg/dL — AB
Specific Gravity, Urine: 1.016 (ref 1.005–1.030)
pH: 6 (ref 5.0–8.0)

## 2024-09-16 LAB — CBC WITH DIFFERENTIAL/PLATELET
Abs Immature Granulocytes: 0.02 K/uL (ref 0.00–0.07)
Basophils Absolute: 0 K/uL (ref 0.0–0.1)
Basophils Relative: 0 %
Eosinophils Absolute: 0 K/uL (ref 0.0–0.5)
Eosinophils Relative: 1 %
HCT: 31.3 % — ABNORMAL LOW (ref 36.0–46.0)
Hemoglobin: 10.5 g/dL — ABNORMAL LOW (ref 12.0–15.0)
Immature Granulocytes: 0 %
Lymphocytes Relative: 28 %
Lymphs Abs: 1.9 K/uL (ref 0.7–4.0)
MCH: 33.8 pg (ref 26.0–34.0)
MCHC: 33.5 g/dL (ref 30.0–36.0)
MCV: 100.6 fL — ABNORMAL HIGH (ref 80.0–100.0)
Monocytes Absolute: 0.5 K/uL (ref 0.1–1.0)
Monocytes Relative: 7 %
Neutro Abs: 4.2 K/uL (ref 1.7–7.7)
Neutrophils Relative %: 64 %
Platelets: 178 K/uL (ref 150–400)
RBC: 3.11 MIL/uL — ABNORMAL LOW (ref 3.87–5.11)
RDW: 14.5 % (ref 11.5–15.5)
WBC: 6.6 K/uL (ref 4.0–10.5)
nRBC: 0 % (ref 0.0–0.2)

## 2024-09-16 LAB — BASIC METABOLIC PANEL WITH GFR
Anion gap: 13 (ref 5–15)
BUN: 12 mg/dL (ref 6–20)
CO2: 23 mmol/L (ref 22–32)
Calcium: 9.5 mg/dL (ref 8.9–10.3)
Chloride: 102 mmol/L (ref 98–111)
Creatinine, Ser: 1.23 mg/dL — ABNORMAL HIGH (ref 0.44–1.00)
GFR, Estimated: 56 mL/min — ABNORMAL LOW
Glucose, Bld: 107 mg/dL — ABNORMAL HIGH (ref 70–99)
Potassium: 3.6 mmol/L (ref 3.5–5.1)
Sodium: 137 mmol/L (ref 135–145)

## 2024-09-16 MED ORDER — ACETAMINOPHEN 500 MG PO TABS
1000.0000 mg | ORAL_TABLET | Freq: Once | ORAL | Status: AC
  Administered 2024-09-16: 1000 mg via ORAL
  Filled 2024-09-16: qty 2

## 2024-09-16 MED ORDER — SODIUM CHLORIDE 0.9 % IV BOLUS: 1000.0000 mL | Freq: Once | INTRAVENOUS | Status: DC

## 2024-09-16 NOTE — ED Triage Notes (Signed)
 Pt to ED c/o left lower leg pain and swelling, reports progressively worse over the past two days, pt here on 12/29, HX DVT,  Followed by DVT clinic, Pt also reports flank pain, concerned for UTI, reports [facility] since being dx with flu 12/20

## 2024-09-16 NOTE — ED Provider Triage Note (Signed)
 Emergency Medicine Provider Triage Evaluation Note  Denise Santana , a 44 y.o. female  was evaluated in triage.  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  Patient was seen in the emergency department on 12/29 for evaluation of DVTs and discharged on Lovenox .  She states that her left leg pain has gotten worse since that time.  Additionally, she is reporting shortness of breath as well as feeling like she has a UTI.  Patient is febrile and hypotensive in triage.  Review of Systems  Positive: Left leg pain, left flank pain, chest pain, shortness of breath, fever Negative:   Physical Exam  BP 96/64 (BP Location: Right Arm)   Pulse 87   Temp (!) 100.8 F (38.2 C)   Resp (!) 22   LMP 08/29/2024 (Exact Date)   SpO2 100%  Gen:   Awake, no distress, febrile and hypotensive Resp:  Normal effort  MSK:   Moves extremities without difficulty.  Firmness noted to left lower extremity, worse than on the right.  Patient reports pain to palpation. Other:    Medical Decision Making  Medically screening exam initiated at 4:07 PM.  Appropriate orders placed.  Meryle Pugmire was informed that the remainder of the evaluation will be completed by another provider, this initial triage assessment does not replace that evaluation, and the importance of remaining in the ED until their evaluation is complete.  Labs, EKG, DVT study ordered.   Torrence Marry RAMAN, [place] 09/16/24 [phone]

## 2024-09-19 NOTE — ED Provider Notes (Signed)
 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 female with PMH significant for PE/DVT on lovenox  s/p IVC filter, anxiety, depression, and tobacco use who presents to the emergency department for progressive leg and abdominal pain.  VSS in triage, nontoxic in appearance.   Ddx includes known DVT vs DVT progression, abdominal pain ddx includes constipation, volvulus, gastroparesis, SBO, less likely malignancy.  Will obtain basic labs including coags, hcg, UA, bnp, trop due to reported dyspnea with known PE.    5:16 PM Labs without any notable leukocytosis, H/H 10.8/31 overall similar to most recent on file, iron and ferritin studies were added.  Troponin less than 3, EKG without any ischemic changes, BNP normal.  Creatinine 1.22, similar to previous.  Imaging pending.  7:43 PM CXR with diffuse bronchial inflammation likely secondary to recent viral infection as she was flu positive within the last 2 weeks, no focal infiltrate or effusion appreciated however patient positioning.  PVL with again noted chronic obstruction to the right femoral vein, at distal thigh, popliteal vein and gastrocnemius vein.  Acute obstruction to left common femoral, femoral vein proximal thigh, femoral vein and mid thigh, forearm vein at distal thigh, popliteal and gastrocnemius.  Notified per radiology that patient appears to have likely occlusive thrombophlebitis with stranding around the lower IVC extending to left femoral vein with pelvic congestion.  Also has bilateral adnexal lesions with torturous dilated gonadal vessels with  recommendation for outpatient pelvic MRI with contrast.  Have discussed case with ED attending and plan to admit.  Heparin  initiated per thrombosis nomogram.  Patient made aware of findings and agreeable. MAO paged for admit.    8:49 PM Pending admit.  Care transferred to MD Hoppens at this time.      Additional Medical Decision Making   I have reviewed the vital signs and the nursing notes. Labs and radiology results that were available during my care of the patient were independently reviewed by me and considered in my medical decision making.   I directly visualized and independently interpreted the EKG tracing.  I independently visualized the radiology images.  I reviewed the patient's prior medical records (meds, hx, ER provider note 09/16/24, 09/13/24, 09/04/24).  I discussed the case and plan for continuity of care with the admitting provider.   Portions of this record have been created using Scientist, clinical (histocompatibility and immunogenetics). Dictation errors have been sought, but may not have been identified and corrected. ____________________________________________      History     Chief Complaint Leg Pain   HPI  Denise Santana is a 44 y.o. female with PMH significant for PE/DVT on lovenox  s/p IVC filter, anxiety, depression, and tobacco use who presents to the emergency department for progressive leg and abdominal pain.  Per chart review, seen at OSH for same 12/29 with acute DVT left common femoral, femoral and proximal profunda vein and EIG as well as left posterior tibial veins with chronic DVT involving left popliteal.  Suggestive of new clot progression.  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  She has multiple complaints today including intermittent dyspnea, increased LE pain and increased LLE edema.  She is hesitant when  asked about lovenox  compliance as per chart review, there have been some noncompliance concerns however eventually informed she is taking medication as prescribed, last dose reported to be this morning.   Has had numbness to left proximal calf, increased swelling since last ER visit.  She has been evaluated per vascular surgery 06/08/24 however per chart review, lost to follow up with hematology as they were unable to reach her to schedule appointment. Also complaining of diffuse abdominal pain more in lower abdomen and states she has not passed gas or had any bowel movement for 14d.  Has had intermittent n/v reportedly and minimal po intake.  No fevers, chest pain, syncope, hematemesis, dysuria or hematuria.  Currently smokes, denies any recreational drug or alcohol use.     Past Medical History[1]  There is no problem list on file for this patient.[2]  Past Surgical History[3]   Current Facility-Administered Medications:    [START ON 09/20/2024] heparin  (porcine) 1000 unit/mL injection 5,000 Units, 5,000 Units, Intravenous, Q6H PRN, Gearline Woodroe Browning, FNP   heparin  (porcine) 1000 unit/mL injection 9,450 Units, 80 Units/kg, Intravenous, Once, [facility], Sondra Michelle, FNP   heparin  25,000 Units/250 mL (100 units/mL) in 0.45% saline infusion (premade), 0-24 Units/kg/hr, Intravenous, Continuous, Gearline Woodroe Browning, FNP No current outpatient medications on file.  Allergies Patient has no known allergies.  Family History[4]  Social History Short Social History[5]    Physical Exam   This provider entered the patient's room: Yes:  If this provider did not enter the room, a comprehensive physical exam was not able to be performed due to increased infection risk to themselves, other providers, staff and other patients), as well as to conserve personal protective equipment (PPE) utilization during the COVID-19 pandemic.  If this provider did enter the patient room, the following was PPE  worn: Surgical mask, eye protection and gloves  ED Triage Vitals  Enc Vitals Group     BP 09/19/24 1524 118/58     Pulse 09/19/24 1522 86     SpO2 Pulse --      Resp 09/19/24 1524 18     Temp 09/19/24 1524 36.9 C (98.4 F)     Temp Source 09/19/24 1524 Oral     SpO2 09/19/24 1522 97 %     Weight 09/19/24 1524 (!) 117.9 kg (260 lb)                                Constitutional: Alert and oriented. Well appearing and in no distress. Eyes: Conjunctivae are normal. Cardiovascular: Normal rate, regular rhythm. No murmurs, rubs or gallops.  Bilateral DPs 2+. Respiratory: Normal respiratory effort. Speaking in full sentences without difficulty.  Breath sounds are normal in all lobes. Gastrointestinal: Soft and nondistended.  Audible bowel sounds in all four quads. There is mild TTP diffusely to lower abdomen, no rebound or guarding.  Musculoskeletal: Swelling noted to BLE, L>R with pitting edema bilateral feet/ankles 2+. No palpable cordlike area, overlying erythema or increased warmth to the touch.  Neurologic: Normal speech and language. No gross focal neurologic deficits are appreciated.  GCS 15 Skin: Skin is warm, dry and intact. No rash noted. Psychiatric: Mood and affect are normal. Speech and behavior are  normal.   EKG   Normal rate, regular rhythm, 77 bpm No axis deviation Normal intervals No significant ST elevation or depression   Radiology   PVL Venous Duplex Lower Extremity Bilateral      CT Abdomen Pelvis W IV Contrast Only  Final Result  1.  There is significant soft tissue thickening/stranding surrounding the lower IVC extending through the left femoral vein, which is concerning for occlusive thrombophlebitis.   2.  Heterogeneous bilateral adnexal lesions measuring up to 5.2 cm with associated tortuous dilated gonadal vessels along with diffuse pelvic fat stranding. This is nonspecific and may represent enlarged ovaries with pelvic venous congestion (in the  setting of extensive pelvic venous thrombosis), but ovarian neoplasms can have a similar appearance. Recommend nonemergent outpatient pelvic MRI with contrast.  3.  Extensive stranding and scattered fluid within the pelvis. Again nonspecific, may be due to venolymphatic congestion or pelvic neoplastic process. Correlate clinically after further evaluation of adnexal lesions  4.  Left pelvic sidewall, iliac, and inguinal lymphadenopathy, nonspecific. Attention on follow-up  5.  Bladder wall thickening with perivesicular fat stranding concerning for acute cystitis. Recommend correlation with urinalysis.      ++++++++++++++++++++    The findings of this study were discussed via telephone with NP [facility] by Dr. Prentice Kansky on 09/19/2024 7:01 PM.    -----------------------------------------------      FOLLOW-UP RECOMMENDATION:    Item for Follow Up:  1. Acuity: Non-urgent  2. Modality: MR  3. Anatomy: Pelvis  4. TimeFrame: 1 Month    XR Chest 2 views  Final Result  No interstitial edema versus viral/atypical infection.    Prominent cardiomediastinal silhouette, which may be accentuated by AP technique. Lack of priors preclude comparison to baseline          Procedures   N/a         [1] No past medical history on file. [2] There is no problem list on file for this patient.  [3] No past surgical history on file. [4] History reviewed. No pertinent family history. [5]    Gearline Woodroe Browning, [place] 09/19/24 2049

## 2024-09-20 NOTE — H&P (Signed)
 Hospital Medicine History & Physical  Medicine (Medicine Care Advancement Team) consult note from 09/20/24, written by Dr. Sugarman, will serve as medicine admission H&P.    Team Contact Information:  Primary Team: Hospital Medicine [facility]), HOSPITALIST NEW ADMIT Pager: HOSPITALIST NEW ADMIT [phone])  Lynwood ONEIDA Gosling, MD

## 2024-09-22 ENCOUNTER — Other Ambulatory Visit (HOSPITAL_COMMUNITY): Payer: Self-pay

## 2024-09-22 ENCOUNTER — Inpatient Hospital Stay: Admitting: Family Medicine

## 2024-09-22 MED ORDER — XARELTO VTE STARTER PACK 15 & 20 MG PO TBPK
ORAL_TABLET | ORAL | 0 refills | Status: AC
  Filled 2024-09-22: qty 51, 30d supply, fill #0

## 2024-09-22 MED ORDER — OXYCODONE HCL 5 MG PO TABS
5.0000 mg | ORAL_TABLET | ORAL | 0 refills | Status: AC | PRN
  Filled 2024-09-22: qty 10, 2d supply, fill #0

## 2024-09-22 MED ORDER — NICOTINE POLACRILEX 4 MG MT GUM
4.0000 mg | CHEWING_GUM | OROMUCOSAL | 0 refills | Status: AC
  Filled 2024-09-22: qty 110, 5d supply, fill #0

## 2024-09-22 MED ORDER — NICOTINE 21 MG/24HR TD PT24
21.0000 mg | MEDICATED_PATCH | Freq: Every day | TRANSDERMAL | 0 refills | Status: AC
  Filled 2024-09-22: qty 28, 28d supply, fill #0

## 2024-09-27 ENCOUNTER — Telehealth: Payer: Self-pay | Admitting: *Deleted

## 2024-09-27 NOTE — Telephone Encounter (Signed)
 Pt called to say that she was in the hospital.  I scheduled a hospital followup for this patient she mentioned a CT referral informed her that if [facility] ordered this she should reach out to them to check on that referral.  Told her that provider would be able to assist in next steps after her appt. Routing to PCP as an FYI

## 2024-09-28 ENCOUNTER — Ambulatory Visit: Payer: Self-pay

## 2024-09-28 ENCOUNTER — Other Ambulatory Visit: Payer: Self-pay

## 2024-09-28 DIAGNOSIS — N179 Acute kidney failure, unspecified: Secondary | ICD-10-CM

## 2024-09-28 NOTE — Telephone Encounter (Signed)
 Copied from CRM [id number]. Topic: Clinical - Request for Lab/Test Order >> Sep 28, 2024 12:08 PM Willma R wrote: Reason for CRM: Patient states she needs to have labs done today to test her kidney levels as she is scheduled to have to a CT done tomorrow and cannot have it done without the labs.  Patient can be reached at [phone]  Labs has been faxed to a different [facility] location [place].

## 2024-09-28 NOTE — Telephone Encounter (Signed)
 No triage performed.  Patient stated that she needed labs done today for CT scheduled for tomorrow.  Husband took phone from patient/wife and began to demand appointment for today.  Would not give phone to wife to authorize communication.  In the back ground, patient/wife stated, it's ok  No appointment available in clinic today.   Patient's husband declined to know any other option or participate in any discussion.  He only stated that he would find an appointment and ended the call.   Copied from CRM [id number]. Topic: Clinical - Medical Advice >> Sep 28, 2024 11:52 AM Antony RAMAN wrote: Reason for CRM: wanting to speak with a  nurse pertraining to lab work and a ct scan [phone]

## 2024-09-28 NOTE — Telephone Encounter (Signed)
 LVM for pt to call to schedule a lab visit.

## 2024-10-04 ENCOUNTER — Ambulatory Visit (INDEPENDENT_AMBULATORY_CARE_PROVIDER_SITE_OTHER)

## 2024-10-04 ENCOUNTER — Telehealth: Payer: Self-pay | Admitting: *Deleted

## 2024-10-04 VITALS — BP 113/77 | HR 94 | Ht 66.0 in | Wt 272.8 lb

## 2024-10-04 DIAGNOSIS — D509 Iron deficiency anemia, unspecified: Secondary | ICD-10-CM | POA: Insufficient documentation

## 2024-10-04 DIAGNOSIS — I2699 Other pulmonary embolism without acute cor pulmonale: Secondary | ICD-10-CM | POA: Diagnosis not present

## 2024-10-04 DIAGNOSIS — Z7901 Long term (current) use of anticoagulants: Secondary | ICD-10-CM | POA: Diagnosis not present

## 2024-10-04 DIAGNOSIS — Z59869 Financial insecurity, unspecified: Secondary | ICD-10-CM

## 2024-10-04 DIAGNOSIS — K5900 Constipation, unspecified: Secondary | ICD-10-CM | POA: Diagnosis not present

## 2024-10-04 DIAGNOSIS — M79662 Pain in left lower leg: Secondary | ICD-10-CM | POA: Diagnosis not present

## 2024-10-04 DIAGNOSIS — M7989 Other specified soft tissue disorders: Secondary | ICD-10-CM

## 2024-10-04 DIAGNOSIS — Z09 Encounter for follow-up examination after completed treatment for conditions other than malignant neoplasm: Secondary | ICD-10-CM | POA: Insufficient documentation

## 2024-10-04 NOTE — Assessment & Plan Note (Signed)
 Chronic anemia, possibly due to GI bleed. Improved post-transfusion. No prior endoscopic evaluation. - Ordered colonoscopy to evaluate for potential GI bleed. - Checked blood counts today with labs

## 2024-10-04 NOTE — Progress Notes (Signed)
 "  Established Patient Office Visit  Subjective   Patient ID: Denise Santana, female    DOB: 1981/08/24  Age: 44 y.o. MRN: [id number]  Chief Complaint  Patient presents with   Hospitalization Follow-up    HPI   History of Present Illness   Denise Santana is a 44 year old female with chronic DVTs and pulmonary embolism who presents for follow-up after recent surgery/hospitalization for recurrent clot of LLE. On 09/21/2024, she underwent image-guided IVC venography, thrombolysis with TPA, mechanical thrombectomy, angioplasty, IVC stenting, and successful IVC filter removal without complications.   Lower extremity edema and pain - Ongoing left leg swelling and pain since hospital discharge - Swelling began prior to discharge and has persisted - Pain described as severe, affecting ambulation - Localized vein pain in the left leg noted to be 10/10 pain.  - Denies CP or worsening shortness of breath  - Per chart review, patient reported this pain and increasing swelling to [facility] provider on 09/28/24 and was triaged to follow up with PCP immediately.   Anticoagulation therapy and adverse effects - Currently taking Xarelto  15 mg BID for 21 days, transitioning to 20 mg once daily thereafter for a minimum of 3 months, although lifelong anticoagulation will likely be needed.  - Experiencing nausea and headaches as side effects of Xarelto , but reports she is still taking it every day.  - Was advised to have close hematology/vein/vascular follow up after discharge for further work up of hypercoagulability.   Hematologic and gastrointestinal symptoms - Chronic iron deficiency anemia - Constipation - No prior endoscopic evaluation - Denies stomach pain or bloody stools  Infectious disease - Diagnosed with Influenza A during recent hospitalization - Managed symptomatically - Starting to feel better  Nicotine  use and smoking cessation - Current smoker - Using nicotine  patch and gum for smoking  cessation  Notes stress regarding her job. Reports that she has to work to be able to pay her rent, but her current pain and health conditions are interfering with her ability to work. She works as a LAWYER. Tearful on examination today. Reports that she has no idea how to apply for disability or FMLA and is requesting connections to assistance.     ROS Per HPI.    Objective:     BP 113/77   Pulse 94   Ht 5' 6 (1.676 m)   Wt 272 lb 12 oz (123.7 kg)   LMP 09/27/2024 (Exact Date)   SpO2 100%   BMI 44.02 kg/m    Physical Exam Constitutional:      General: She is not in acute distress.    Appearance: Normal appearance.  Cardiovascular:     Rate and Rhythm: Normal rate and regular rhythm.     Heart sounds: Normal heart sounds. No murmur heard.    No friction rub. No gallop.  Pulmonary:     Effort: Pulmonary effort is normal. No respiratory distress.     Breath sounds: Normal breath sounds.  Musculoskeletal:     Right lower leg: Edema present.     Left lower leg: Edema present.     Comments: L>R  Positive homans sign on left lower extremity  Skin:    General: Skin is warm and dry.  Neurological:     General: No focal deficit present.     Mental Status: She is alert.  Psychiatric:        Mood and Affect: Mood normal.        Behavior: Behavior normal.  Thought Content: Thought content normal.      No results found for any visits on 10/04/24.  Last CBC Lab Results  Component Value Date   WBC 6.6 09/16/2024   HGB 10.5 (L) 09/16/2024   HCT 31.3 (L) 09/16/2024   MCV 100.6 (H) 09/16/2024   MCH 33.8 09/16/2024   RDW 14.5 09/16/2024   PLT 178 09/16/2024   Last metabolic panel Lab Results  Component Value Date   GLUCOSE 107 (H) 09/16/2024   NA 137 09/16/2024   K 3.6 09/16/2024   CL 102 09/16/2024   CO2 23 09/16/2024   BUN 12 09/16/2024   CREATININE 1.23 (H) 09/16/2024   GFRNONAA 56 (L) 09/16/2024   CALCIUM 9.5 09/16/2024   PROT 7.5 09/13/2024    ALBUMIN  4.7 09/13/2024   LABGLOB 2.1 05/18/2024   BILITOT 0.6 09/13/2024   ALKPHOS 104 09/13/2024   AST 20 09/13/2024   ALT 27 09/13/2024   ANIONGAP 13 09/16/2024   Last lipids Lab Results  Component Value Date   CHOL 145 05/18/2024   HDL 38 (L) 05/18/2024   LDLCALC 87 05/18/2024   TRIG 106 05/18/2024   CHOLHDL 3.8 05/18/2024   Last hemoglobin A1c Lab Results  Component Value Date   HGBA1C 5.4 05/18/2024   Last thyroid functions Lab Results  Component Value Date   TSH 0.645 05/18/2024   Last vitamin D  Lab Results  Component Value Date   VD25OH 25.5 (L) 05/18/2024      The 10-year ASCVD risk score (Arnett DK, et al., 2019) is: 1.3%    Assessment & Plan:   Chronic anticoagulation -     Comprehensive metabolic panel with GFR -     CBC with Differential/Platelet -     AMB Referral VBCI Care Management -     AMB Referral to [facility]rombosis [facility]  Recurrent pulmonary emboli (HCC) -     Ambulatory referral to Gastroenterology -     VAS US  LOWER EXTREMITY VENOUS (DVT); Future -     AMB Referral VBCI Care Management -     AMB Referral to Deep Vein Thrombosis Clinic  Constipation, unspecified constipation type -     Ambulatory referral to Gastroenterology  Iron deficiency anemia, unspecified iron deficiency anemia type Assessment & Plan: Chronic anemia, possibly due to GI bleed. Improved post-transfusion. No prior endoscopic evaluation. - Ordered colonoscopy to evaluate for potential GI bleed. - Checked blood counts today with labs  Orders: -     Ambulatory referral to Gastroenterology -     VAS US  LOWER EXTREMITY VENOUS (DVT); Future -     AMB Referral to Deep Vein Thrombosis Clinic  Financial insecurity -     AMB Referral VBCI Care Management  Pain and swelling of left lower leg -     VAS US  LOWER EXTREMITY VENOUS (DVT); Future -     AMB Referral to Deep Vein Thrombosis Clinic  Pain and swelling of lower leg, left Assessment & Plan: Leg leg  swelling with severe TTP on exam and positive Homan's sign. Pain is 10/10. No CP or SOB.  - Order stat ultrasound of left leg to evaluate for recurrent DVT  - Advised ER if ultrasound confirms new clot  - Continue Xarelto  as prescribed for now  - Advised strict ER precautions to include worsening pain, SOB, CP, etc.    Hospital discharge follow-up Assessment & Plan: Post-IVC stenting and thrombolysis for left DVT. On Xarelto . Acute increased swelling and left lower leg pain  with positive Homan's sign.  - Continue Xarelto  as prescribed. - Ensure follow-up with hematology for thrombophilia workup. New urgent referral placed today so patient may be able to get sooner appt.  - STAT venous duplex today with strict ER precautions discussed.  - Following up on CBC and CMP with labs today. Current anticoagulation workup started in the hospital pending with negative anti-phospholipid workup. - Continue with nicotine  replacement to stop smoking. Discussed that this can help reduce her risk for recurrent DVT.  Patient verbalized understanding and was in agreement with the plan.      Return in about 8 weeks (around 11/29/2024) for Med check.    Saddie JULIANNA Sacks, PA-C "

## 2024-10-04 NOTE — Assessment & Plan Note (Addendum)
 Post-IVC stenting and thrombolysis for left DVT. On Xarelto . Acute increased swelling and left lower leg pain with positive Homan's sign.  - Continue Xarelto  as prescribed. - Ensure follow-up with hematology for thrombophilia workup. New urgent referral placed today so patient may be able to get sooner appt.  - STAT venous duplex today with strict ER precautions discussed.  - Following up on CBC and CMP with labs today. Current anticoagulation workup started in the hospital pending with negative anti-phospholipid workup. - Continue with nicotine  replacement to stop smoking. Discussed that this can help reduce her risk for recurrent DVT.  Patient verbalized understanding and was in agreement with the plan.

## 2024-10-04 NOTE — Patient Instructions (Signed)
 VISIT SUMMARY: During your visit, we addressed your ongoing right leg swelling and pain, recent DVT and surgical interventions, anticoagulation therapy side effects, and other health concerns.  YOUR PLAN: SUSPECTED ACUTE RIGHT LOWER EXTREMITY DEEP VEIN THROMBOSIS: You have acute swelling and pain in your right leg, which may indicate a DVT. -We ordered a stat ultrasound of your right leg to check for a DVT. -If the ultrasound confirms a new clot, please visit the ER immediately. -Continue taking Xarelto  as prescribed.  ACUTE LEFT LOWER EXTREMITY DEEP VEIN THROMBOSIS, POST IVC STENT: You recently had a procedure for your left DVT and are currently on Xarelto . -Continue taking Xarelto  as prescribed. -Ensure you follow up with hematology for a thrombophilia workup.  CHRONIC IRON DEFICIENCY ANEMIA: You have chronic anemia, possibly due to a GI bleed. -We ordered a colonoscopy to check for a potential GI bleed. -We checked your blood counts today.  NICOTINE  DEPENDENCE: You are a current smoker using nicotine  patch and gum for cessation. -Continue using the nicotine  patch and gum for smoking cessation.  HISTORY OF PULMONARY EMBOLISM: You have a history of pulmonary embolism and recent DVT management. -Ensure you follow up with hematology for anticoagulation management.  RECENT INFLUENZA A INFECTION: You had Influenza A during your recent hospitalization and are still experiencing congestion. -We will hold off on the flu shot until your symptoms resolve.  CHRONIC CONSTIPATION: You have chronic constipation and need a GI evaluation due to chronic iron deficiency anemia. -We ordered a colonoscopy to check for a potential GI bleed.  If you have any problems before your next visit feel free to message me via MyChart (minor issues or questions) or call the office, otherwise you may reach out to schedule an office visit.  Thank you! Saddie Sacks, PA-C

## 2024-10-04 NOTE — Assessment & Plan Note (Signed)
 Leg leg swelling with severe TTP on exam and positive Homan's sign. Pain is 10/10. No CP or SOB.  - Order stat ultrasound of left leg to evaluate for recurrent DVT  - Advised ER if ultrasound confirms new clot  - Continue Xarelto  as prescribed for now  - Advised strict ER precautions to include worsening pain, SOB, CP, etc.

## 2024-10-04 NOTE — Progress Notes (Signed)
 Complex Care Management Note  Care Guide Note 10/04/2024 Name: Denise Santana MRN: [id number] DOB: June 12, 1981  Denise Santana is a 44 y.o. year old female who sees [place], Saddie FAL[place], [place] for primary care. I reached out to [facility] by phone today to offer complex care management services.  Ms. Dampier was given information about Complex Care Management services today including:   The Complex Care Management services include support from the care team which includes your Nurse Care Manager, Clinical Social Worker, or Pharmacist.  The Complex Care Management team is here to help remove barriers to the health concerns and goals most important to you. Complex Care Management services are voluntary, and the patient may decline or stop services at any time by request to their care team member.   Complex Care Management Consent Status: Patient agreed to services and verbal consent obtained.   Follow up plan:  Telephone appointment with complex care management team member scheduled for:  10/14/2024 and 10/18/2024  Encounter Outcome:  Patient Scheduled  Thedford Franks, CMA, AAMA [place]  [facility], [facility] Guide, Lead Direct Dial: [phone]  Fax: [phone]

## 2024-10-05 ENCOUNTER — Telehealth: Payer: Self-pay

## 2024-10-05 ENCOUNTER — Ambulatory Visit: Payer: Self-pay

## 2024-10-05 ENCOUNTER — Inpatient Hospital Stay

## 2024-10-05 ENCOUNTER — Other Ambulatory Visit (HOSPITAL_COMMUNITY): Payer: Self-pay

## 2024-10-05 ENCOUNTER — Ambulatory Visit (HOSPITAL_COMMUNITY)
Admission: RE | Admit: 2024-10-05 | Discharge: 2024-10-05 | Disposition: A | Discharge status: Home / Self Care | Source: Ambulatory Visit

## 2024-10-05 ENCOUNTER — Encounter: Payer: Self-pay | Admitting: Student-PharmD

## 2024-10-05 ENCOUNTER — Ambulatory Visit: Admitting: Student-PharmD

## 2024-10-05 DIAGNOSIS — D509 Iron deficiency anemia, unspecified: Secondary | ICD-10-CM | POA: Insufficient documentation

## 2024-10-05 DIAGNOSIS — I2699 Other pulmonary embolism without acute cor pulmonale: Secondary | ICD-10-CM | POA: Diagnosis not present

## 2024-10-05 DIAGNOSIS — M79662 Pain in left lower leg: Secondary | ICD-10-CM | POA: Insufficient documentation

## 2024-10-05 DIAGNOSIS — D539 Nutritional anemia, unspecified: Secondary | ICD-10-CM

## 2024-10-05 DIAGNOSIS — M7989 Other specified soft tissue disorders: Secondary | ICD-10-CM | POA: Insufficient documentation

## 2024-10-05 DIAGNOSIS — I82533 Chronic embolism and thrombosis of popliteal vein, bilateral: Secondary | ICD-10-CM | POA: Insufficient documentation

## 2024-10-05 LAB — COMPREHENSIVE METABOLIC PANEL WITH GFR
ALT: 6 IU/L (ref 0–32)
AST: 10 IU/L (ref 0–40)
Albumin: 4.4 g/dL (ref 3.9–4.9)
Alkaline Phosphatase: 96 IU/L (ref 41–116)
BUN/Creatinine Ratio: 8 — ABNORMAL LOW (ref 9–23)
BUN: 11 mg/dL (ref 6–24)
Bilirubin Total: 1 mg/dL (ref 0.0–1.2)
CO2: 17 mmol/L — ABNORMAL LOW (ref 20–29)
Calcium: 9 mg/dL (ref 8.7–10.2)
Chloride: 105 mmol/L (ref 96–106)
Creatinine, Ser: 1.37 mg/dL — ABNORMAL HIGH (ref 0.57–1.00)
Globulin, Total: 2.3 g/dL (ref 1.5–4.5)
Glucose: 80 mg/dL (ref 70–99)
Potassium: 3.6 mmol/L (ref 3.5–5.2)
Sodium: 140 mmol/L (ref 134–144)
Total Protein: 6.7 g/dL (ref 6.0–8.5)
eGFR: 49 mL/min/1.73 — ABNORMAL LOW

## 2024-10-05 LAB — CBC WITH DIFFERENTIAL/PLATELET
Basophils Absolute: 0 x10E3/uL (ref 0.0–0.2)
Basos: 0 %
EOS (ABSOLUTE): 0 x10E3/uL (ref 0.0–0.4)
Eos: 0 %
Hematocrit: 24.5 % — ABNORMAL LOW (ref 34.0–46.6)
Hemoglobin: 8.1 g/dL — ABNORMAL LOW (ref 11.1–15.9)
Immature Grans (Abs): 0 x10E3/uL (ref 0.0–0.1)
Immature Granulocytes: 0 %
Lymphocytes Absolute: 2.1 x10E3/uL (ref 0.7–3.1)
Lymphs: 20 %
MCH: 33.2 pg — ABNORMAL HIGH (ref 26.6–33.0)
MCHC: 33.1 g/dL (ref 31.5–35.7)
MCV: 100 fL — ABNORMAL HIGH (ref 79–97)
Monocytes Absolute: 0.5 x10E3/uL (ref 0.1–0.9)
Monocytes: 5 %
Neutrophils Absolute: 7.7 x10E3/uL — ABNORMAL HIGH (ref 1.4–7.0)
Neutrophils: 75 %
Platelets: 216 x10E3/uL (ref 150–450)
RBC: 2.44 x10E6/uL — CL (ref 3.77–5.28)
RDW: 17 % — ABNORMAL HIGH (ref 11.7–15.4)
WBC: 10.4 x10E3/uL (ref 3.4–10.8)

## 2024-10-05 NOTE — Telephone Encounter (Signed)
 I have typed and printed the note. She can come pick it up any time today

## 2024-10-05 NOTE — Progress Notes (Signed)
 " DVT Clinic Note  Name: Denise Santana     MRN: [id number]     DOB: 1981-01-06     Sex: female  PCP: Gayle Saddie JULIANNA DEVONNA  Today's Visit: Visit Information: Follow Up Visit  Referred to DVT Clinic by: Primary Care - Saddie Gayle, PA-C Referred to CPP by: Dr. Sheree Reason for referral:  Chief Complaint  Patient presents with   Med Management - DVT   HISTORY OF PRESENT ILLNESS: Denise Santana is a 44 y.o. female with PMH recurrent DVT and PE, B12 deficiency, anxiety, depression, tobacco use, who presents after diagnosis of DVT for medication management. Patient has a complicated history of recurrent DVT and PE. Last seen in DVT Clinic 06/08/24 for recurrent DVT at which time there was no role for vascular intervention but we helped her access Lovenox , which hematology had recommended during recent hospitalization, and referred her to follow up long-term with hematology but she did not establish there. Patient was most recently found to have recurrent acute DVT 08/2024 in the setting of non-adherence to anticoagulation. She was then admitted at [facility] 09/19/24-09/22/24 when she left AMA and is s/p IVC filter removal, IVC and iliac vein thrombolysis and thrombectomy and IVC stenting on 09/21/24 by vascular interventional radiology. She was switched to Xarelto  at discharge by [facility] hematology. She was scheduled for a repeat CT on 09/29/24 by vascular IR at [facility] but she no showed. She was seen for follow up by her PCP yesterday who was concerned for possible new clot given continued symptoms and referred her for repeat ultrasounds and a DVT Clinic visit. Patient is accompanied by her husband and using a wheelchair. Endorses continued pain and swelling, particularly in her left leg. She is taking Xarelto  15 mg twice daily for about another week and a half then will switch to 20 mg daily. Asks for refills of this. Also asks for pain medication. Says that nothing helps her pain and swelling. Would also like to re-establish with  hematology at [facility] as this is closer to home than [facility], which she is scheduled for follow up in April.   Positive Thrombotic Risk Factors: Previous VTE, Obesity, Smoking Bleeding Risk Factors: Anticoagulant therapy  Negative Thrombotic Risk Factors: Recent surgery (within 3 months), Recent trauma (within 3 months), Recent admission to hospital with acute illness (within 3 months), Paralysis, paresis, or recent plaster cast immobilization of lower extremity, Central venous catheterization, Bed rest >72 hours within 3 months, Sedentary journey lasting >8 hours within 4 weeks, Pregnancy, Within 6 weeks postpartum, Recent cesarean section (within 3 months), Estrogen therapy, Testosterone therapy, Erythropoiesis-stimulating agent, Recent COVID diagnosis (within 3 months), Active cancer, Known thrombophilic condition, Non-malignant, chronic inflammatory condition, Older age  Rx Insurance Coverage: Medicaid Rx Affordability: Xarelto  is $4/month or per 3 months Rx Assistance Provided: None needed at this time Preferred Pharmacy: [facility]  Past Medical History:  Diagnosis Date   DVT (deep venous thrombosis) [facility])     Past Surgical History:  Procedure Laterality Date   APPENDECTOMY     IR ANGIOGRAM PULMONARY BILATERAL SELECTIVE  05/09/2024   IR ANGIOGRAM SELECTIVE EACH ADDITIONAL VESSEL  05/09/2024   IR ANGIOGRAM SELECTIVE EACH ADDITIONAL VESSEL  05/09/2024   IR INFUSION THROMBOL ARTERIAL INITIAL (MS)  05/09/2024   IR INFUSION THROMBOL ARTERIAL INITIAL (MS)  05/09/2024   IR IVC FILTER PLMT / S&I /IMG GUID/MOD SED  05/09/2024   IR THROMB F/U EVAL ART/VEN FINAL DAY (MS)  05/10/2024   IR  US  GUIDE VASC ACCESS RIGHT  05/09/2024   KIDNEY SURGERY      Social History   Socioeconomic History   Marital status: Single    Spouse name: Not on file   Number of children: 4   Years of education: Not on file   Highest education level: Not on file  Occupational History   Occupation:  PCA/ [employer]  Tobacco Use   Smoking status: Every Day    Current packs/day: 1.00    Types: Cigarettes   Smokeless tobacco: Never  Vaping Use   Vaping status: Never Used  Substance and Sexual Activity   Alcohol use: Never   Drug use: Never   Sexual activity: Yes  Other Topics Concern   Not on file  Social History Narrative   Caffeine Intake: __0_   cups per day   Weight:    Are you satisfied with your weight? YES   Diet: How do you rate your diet?GOOD   Do you eat or drink 4 servings of daily or soy daily or take calcium supplements?NO   Exercise:    Do you exercise regularly?NO   What type of exercise? N/A   How long(minutes)? N/A   How often? N/A   Safety:    Do you wear a bike helmet? YES   Do you use your seatbelts constantly?YES   Is violence at home a concern for you?NO   Have you ever been abused?NO   Do you have a gun in your home?NO   Social Drivers of Health   Tobacco Use: High Risk (10/04/2024)   Patient History    Smoking Tobacco Use: Every Day    Smokeless Tobacco Use: Never    Passive Exposure: Not on file  Financial Resource Strain: Low Risk (09/21/2024)   Received from [facility]   Overall Financial Resource Strain (CARDIA)    How hard is it for you to pay for the very basics like food, housing, medical care, and heating?: Not very hard  Food Insecurity: No Food Insecurity (05/09/2024)   Epic    Worried About Running Out of Food in the Last Year: Never true    Ran Out of Food in the Last Year: Never true  Transportation Needs: No Transportation Needs (05/09/2024)   Epic    Lack of Transportation (Medical): No    Lack of Transportation (Non-Medical): No  Physical Activity: Not on file  Stress: Not on file  Social Connections: Not on file  Intimate Partner Violence: Not At Risk (05/09/2024)   Epic    Fear of Current or Ex-Partner: No    Emotionally Abused: No    Physically Abused: No    Sexually Abused: No  Depression  (PHQ2-9): High Risk (10/04/2024)   Depression (PHQ2-9)    PHQ-2 Score: 18  Alcohol Screen: Not on file  Housing: High Risk (05/09/2024)   Epic    Unable to Pay for Housing in the Last Year: Yes    Number of Times Moved in the Last Year: 1    Homeless in the Last Year: Yes  Utilities: At Risk (05/09/2024)   Epic    Threatened with loss of utilities: Already shut off  Health Literacy: Not on file    Family History  Problem Relation Age of Onset   Hypertension Mother    Hyperlipidemia Mother    Diabetes Mother    Heart failure Mother    Heart attack Maternal Grandmother     Allergies as  of 10/05/2024   (No Known Allergies)    Medications Ordered Prior to Encounter[1] REVIEW OF SYSTEMS:  Review of Systems  Respiratory:  Positive for shortness of breath.   Cardiovascular:  Positive for leg swelling. Negative for chest pain.  Musculoskeletal:  Positive for myalgias.  Neurological:  Positive for tingling. Negative for dizziness.   PHYSICAL EXAMINATION:  Physical Exam Musculoskeletal:        General: Swelling and tenderness present.   Villalta Score for Post-Thrombotic Syndrome: Pain: Moderate Cramps: Moderate Heaviness: Moderate Paresthesia: Mild Pruritus: Absent Pretibial Edema: Moderate Skin Induration: Absent Hyperpigmentation: Absent Redness: Absent Venous Ectasia: Absent Pain on calf compression: Moderate Villalta Preliminary Score: 11 Is venous ulcer present?: No If venous ulcer is present and score is <15, then 15 points total are assigned: Absent Villalta Total Score: 11  LABS:  CBC     Component Value Date/Time   WBC 10.4 10/04/2024 0942   WBC 6.6 09/16/2024 1626   RBC 2.44 (LL) 10/04/2024 0942   RBC 3.11 (L) 09/16/2024 1626   HGB 8.1 (L) 10/04/2024 0942   HCT 24.5 (L) 10/04/2024 0942   PLT 216 10/04/2024 0942   MCV 100 (H) 10/04/2024 0942   MCH 33.2 (H) 10/04/2024 0942   MCH 33.8 09/16/2024 1626   MCHC 33.1 10/04/2024 0942   MCHC 33.5 09/16/2024  1626   RDW 17.0 (H) 10/04/2024 0942   LYMPHSABS 2.1 10/04/2024 0942   MONOABS 0.5 09/16/2024 1626   EOSABS 0.0 10/04/2024 0942   BASOSABS 0.0 10/04/2024 0942    Hepatic Function      Component Value Date/Time   PROT 6.7 10/04/2024 0942   ALBUMIN  4.4 10/04/2024 0942   AST 10 10/04/2024 0942   ALT 6 10/04/2024 0942   ALKPHOS 96 10/04/2024 0942   BILITOT 1.0 10/04/2024 0942    Renal Function   Lab Results  Component Value Date   CREATININE 1.37 (H) 10/04/2024   CREATININE 1.23 (H) 09/16/2024   CREATININE 1.25 (H) 09/13/2024    Estimated Creatinine Clearance: 71.9 mL/min (A) (by C-G formula based on SCr of 1.37 mg/dL (H)).   [facility] Vascular Lab Studies:  10/05/24 VAS US  LOWER EXTREMITY VENOUS BILAT (DVT) Summary:  RIGHT:  - Findings consistent with chronic deep vein thrombosis involving the  right distal femoral vein, and right popliteal vein. Calf veins not  visualized.    LEFT:  - Findings consistent with chronic deep vein thrombosis involving the left  common femoral vein, left femoral vein, left proximal profunda vein, and  left popliteal vein. Calf veins not visualized.    NOTE: Visualized segments of the IVC appear patent as well as the left  Iliac veins. The right external iliac veins appear patent. Suboptimal  visualization.   ASSESSMENT: Location of DVT: Left femoral vein, Right femoral vein, Left common femoral vein, Left popliteal vein, Right popliteal vein Cause of DVT: unprovoked  Discussed with Dr. Sheree in clinic today. 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 Reassuring that there was no new clot formation and that the visualized portions of the IVC and iliac veins appear patent. There is nothing else we can offer the patient from a vascular surgery standpoint. She needs to follow up with the interventional radiologists who did her procedure at [facility], but she missed a CT scan  with them on 09/29/24. We are unable to prescribe pain medication for this. Educated patient that she has refills available  at the pharmacy for Xarelto  and would just need to ask them to get this ready for her. She would like to follow up with hematology at [facility] as this will be closer to home. We have referred her to [facility] hematology before but she did not schedule an appointment, but we will send a new referral over to attempt scheduling. Patient is frustrated with the disjointed care she has received. Emphasized importance of attending appointments with hematology and interventional radiology and of the need to not miss any doses of Xarelto .   PLAN: -Continue rivaroxaban  (Xarelto ) 15 mg twice daily with food for 21 days followed by 20 mg daily with food. -Expected duration of therapy: Indefinite. Therapy started on most recent hospital admission. -Patient educated on purpose, proper use and potential adverse effects of rivaroxaban  (Xarelto ). -Discussed importance of taking medication around the same time every day. -Advised patient of medications to avoid (NSAIDs, aspirin doses >100 mg daily). -Educated that Tylenol  (acetaminophen ) is the preferred analgesic to lower the risk of bleeding. -Advised patient to alert all providers of anticoagulation therapy prior to starting a new medication or having a procedure. -Emphasized importance of monitoring for signs and symptoms of bleeding (abnormal bruising, prolonged bleeding, nose bleeds, bleeding from gums, discolored urine, black tarry stools). -Educated patient to present to the ED if emergent signs and symptoms of new thrombosis occur. -Counseled patient to wear compression stockings daily, removing at night.  Follow up: hematology and interventional radiology. No further follow up needed at [facility].   Lum Herald, PharmD, BCACP, CPP [facility] [phone]    [1]  Current Outpatient Medications on  File Prior to Visit  Medication Sig Dispense Refill   Rivaroxaban  Starter Pack, 15 mg and 20 mg, (XARELTO  STARTER PACK) Take 15 mg twice a day for 21 days and then 20 mg once a day. 51 each 0   acetaminophen  (TYLENOL ) 325 MG tablet Take 2 tablets (650 mg total) by mouth every 6 (six) hours.     hydrOXYzine  (VISTARIL ) 50 MG capsule Take 1 capsule (50 mg total) by mouth at bedtime as needed. 30 capsule 0   nicotine  (NICODERM CQ  - DOSED IN MG/24 HOURS) 21 mg/24hr patch Place 1 patch (21 mg total) onto the skin daily. 28 patch 0   nicotine  polacrilex (NICORETTE ) 4 MG gum Apply 1 each (4 mg total) to cheek every hour as needed for smoking cessation. 110 each 0   oxyCODONE  (OXY IR/ROXICODONE ) 5 MG immediate release tablet Take 1 tablet (5 mg total) by mouth every four (4) hours as needed for up to 5 days. 10 tablet 0   rivaroxaban  (XARELTO ) 20 MG TABS tablet Take 1 tablet (20 mg total) by mouth daily with supper. 30 tablet 2   sertraline  (ZOLOFT ) 50 MG tablet Take 1 tablet (50 mg total) by mouth daily. 30 tablet 2   No current facility-administered medications on file prior to visit.   "

## 2024-10-05 NOTE — Telephone Encounter (Signed)
 Copied from CRM #[id number]. Topic: General - Other >> Oct 05, 2024  4:02 PM Sophia H wrote: Reason for CRM: Patient states she is needing an updated doctors note for her job ASAP so she is clear to go to work tomorrow. US  found no clots per patient.   Needs it to state her walking is limited to one hallway and no restrictions as far as lifting. Please advise via my chart.    Fax # for her job # [phone] ATTN Tammmy Duna

## 2024-10-06 ENCOUNTER — Telehealth: Payer: Self-pay

## 2024-10-06 NOTE — Telephone Encounter (Signed)
 Left the patient a voicemail to call and schedule a lab and follow-up appointment sometime soon.

## 2024-10-07 ENCOUNTER — Other Ambulatory Visit: Payer: Self-pay

## 2024-10-07 MED ORDER — ONDANSETRON HCL 4 MG PO TABS: 4.0000 mg | ORAL_TABLET | Freq: Three times a day (TID) | ORAL | 0 refills | Status: AC | PRN

## 2024-10-13 ENCOUNTER — Telehealth: Payer: Self-pay

## 2024-10-13 NOTE — Telephone Encounter (Signed)
 Scheduled the patient for a lab and follow-up next week. Called and spoke with the patient, she is aware of the day and time.

## 2024-10-14 ENCOUNTER — Other Ambulatory Visit: Payer: Self-pay

## 2024-10-14 NOTE — Patient Instructions (Signed)
 Visit Information  Thank you for taking time to visit with me today. Please don't hesitate to contact me if I can be of assistance to you before our next scheduled appointment.    Following is a copy of your care plan:   Goals Addressed             This Visit's Progress    BSW Goals       Current SDOH Barriers:  Financial constraints related to paying bills  Interventions: Patient interviewed and appropriate screenings performed Referred patient to community resources  Provided patient with information about community resources for rent and utilities Advised patient to contact [employer], [facility], [place] for financial assistance Provided education on Advanced Directives Patient has been out of work in January due to medical issues.  Patient did receive 2 paychecks and has almost $400 toward $539 rent.  Patient has return to work but needs help with bills.   SW to mail Advanced Directives forms. Patient declined food referral. Patient declined follow up.          Please call 911 if you are experiencing a Mental Health or Behavioral Health Crisis or need someone to talk to.  Patient verbalized understanding of Care plan and visit instructions communicated this visit  Tillman Gardener, BSW [place]  [facility], [facility] Social Worker Direct Dial: [phone]  Fax: [phone] Website: [email or web address]

## 2024-10-14 NOTE — Patient Outreach (Signed)
 Social Drivers of Health  Community Resource and Care Coordination Visit Note   10/14/2024  Name: Denise Santana MRN: [id number] DOB:08-24-1981  Situation: Referral received for [facility] needs assessment and assistance related to Financial Strain . I obtained verbal consent from Patient.  Visit completed with Patient on the phone.   Background:   SDOH Interventions Today    Flowsheet Row Most Recent Value  SDOH Interventions   Food Insecurity Interventions Intervention Not Indicated  Housing Interventions Other (Comment)  [Due to medical issues patient does not have all of the rent money and will apply with local resources for assistance]  Transportation Interventions Intervention Not Indicated  [Have a car]  Utilities Interventions Community Resources Provided  [Will apply for community resources as bill is due soon]  Financial Strain Interventions Intervention Not Indicated     Assessment:   Goals Addressed             This Visit's Progress    BSW Goals       Current SDOH Barriers:  Financial constraints related to paying bills  Interventions: Patient interviewed and appropriate screenings performed Referred patient to community resources  Provided patient with information about community resources for rent and utilities Advised patient to contact [employer], [facility], [place] for financial assistance Provided education on Advanced Directives Patient has been out of work in January due to medical issues.  Patient did receive 2 paychecks and has almost $400 toward $539 rent.  Patient has return to work but needs help with bills.   SW to mail Advanced Directives forms. Patient declined food referral. Patient declined follow up.          Recommendation:   Patient to follow up with community resources for rent and utilities.  Follow Up Plan:   Patient declines further calls or assistance. [employer] will be closed. Patient  has been provided contact information should new needs arise.   Tillman Gardener, BSW [place]  [facility], [facility] Social Worker Direct Dial: [phone]  Fax: [phone] Website: [email or web address]

## 2024-10-15 ENCOUNTER — Other Ambulatory Visit (HOSPITAL_COMMUNITY): Payer: Self-pay

## 2024-10-18 ENCOUNTER — Telehealth: Payer: Self-pay

## 2024-10-18 NOTE — Progress Notes (Unsigned)
 [facility] OFFICE PROGRESS NOTE  Patient Care Team: Gayle Saddie JULIANNA DEVONNA as PCP - General (Physician Assistant)  Assessment & Plan   No orders of the defined types were placed in this encounter.    [name], MD  INTERVAL HISTORY: Patient returns for follow-up.  Oncology History   No problem history exists.     PHYSICAL EXAMINATION: ECOG PERFORMANCE STATUS: {CHL ONC ECOG PS:[phone]}  There were no vitals filed for this visit. There were no vitals filed for this visit.  GENERAL: alert, no distress and comfortable SKIN: skin color normal and no jaundice or bruising or petechiae on exposed skin EYES: normal, sclera clear OROPHARYNX: no exudate  NECK: No palpable mass LYMPH:  no palpable cervical, axillary lymphadenopathy  LUNGS: clear to auscultation and no wheeze or rales with normal breathing effort HEART: regular rate & rhythm  ABDOMEN: abdomen soft, non-tender and nondistended. Musculoskeletal: no edema NEURO: no focal motor/sensory deficits  Relevant data reviewed during this visit included labs.  New labs ordered.

## 2024-10-19 ENCOUNTER — Inpatient Hospital Stay

## 2024-11-30 ENCOUNTER — Ambulatory Visit
